# Patient Record
Sex: Female | Born: 1955 | Race: White | Hispanic: No | Marital: Married | Smoking: Current every day smoker
Health system: Southern US, Community
[De-identification: ages and names within clinical notes are randomized; demographics above are authoritative.]

## PROBLEM LIST (undated history)

## (undated) DIAGNOSIS — IMO0002 Reserved for concepts with insufficient information to code with codable children: Secondary | ICD-10-CM

## (undated) DIAGNOSIS — Z8742 Personal history of other diseases of the female genital tract: Secondary | ICD-10-CM

## (undated) DIAGNOSIS — C539 Malignant neoplasm of cervix uteri, unspecified: Secondary | ICD-10-CM

## (undated) DIAGNOSIS — IMO0001 Reserved for inherently not codable concepts without codable children: Secondary | ICD-10-CM

## (undated) DIAGNOSIS — D509 Iron deficiency anemia, unspecified: Secondary | ICD-10-CM

## (undated) DIAGNOSIS — C719 Malignant neoplasm of brain, unspecified: Secondary | ICD-10-CM

## (undated) DIAGNOSIS — R011 Cardiac murmur, unspecified: Secondary | ICD-10-CM

## (undated) HISTORY — DX: Reserved for inherently not codable concepts without codable children: IMO0001

## (undated) HISTORY — PX: TUBAL LIGATION: SHX77

## (undated) HISTORY — DX: Reserved for concepts with insufficient information to code with codable children: IMO0002

## (undated) HISTORY — PX: CARPAL TUNNEL RELEASE: SHX101

---

## 1985-01-17 HISTORY — PX: OVARIAN CYST REMOVAL: SHX89

## 2015-08-08 ENCOUNTER — Emergency Department (HOSPITAL_COMMUNITY)
Admission: EM | Admit: 2015-08-08 | Discharge: 2015-08-09 | Disposition: A | Discharge status: Home / Self Care | Payer: BLUE CROSS/BLUE SHIELD | Attending: Emergency Medicine | Admitting: Emergency Medicine

## 2015-08-08 ENCOUNTER — Encounter (HOSPITAL_COMMUNITY): Payer: Self-pay | Admitting: Emergency Medicine

## 2015-08-08 DIAGNOSIS — N939 Abnormal uterine and vaginal bleeding, unspecified: Secondary | ICD-10-CM

## 2015-08-08 DIAGNOSIS — R42 Dizziness and giddiness: Secondary | ICD-10-CM | POA: Diagnosis not present

## 2015-08-08 DIAGNOSIS — N938 Other specified abnormal uterine and vaginal bleeding: Secondary | ICD-10-CM | POA: Diagnosis present

## 2015-08-08 DIAGNOSIS — Z720 Tobacco use: Secondary | ICD-10-CM | POA: Insufficient documentation

## 2015-08-08 LAB — TYPE AND SCREEN
ABO/RH(D): O NEG
Antibody Screen: NEGATIVE

## 2015-08-08 LAB — BASIC METABOLIC PANEL
ANION GAP: 9 (ref 5–15)
BUN: 11 mg/dL (ref 6–20)
CALCIUM: 8.5 mg/dL — AB (ref 8.9–10.3)
CO2: 22 mmol/L (ref 22–32)
CREATININE: 0.73 mg/dL (ref 0.44–1.00)
Chloride: 100 mmol/L — ABNORMAL LOW (ref 101–111)
GLUCOSE: 112 mg/dL — AB (ref 65–99)
Potassium: 3.5 mmol/L (ref 3.5–5.1)
Sodium: 131 mmol/L — ABNORMAL LOW (ref 135–145)

## 2015-08-08 LAB — URINALYSIS, ROUTINE W REFLEX MICROSCOPIC
Bilirubin Urine: NEGATIVE
GLUCOSE, UA: NEGATIVE mg/dL
Ketones, ur: NEGATIVE mg/dL
NITRITE: NEGATIVE
PH: 6.5 (ref 5.0–8.0)
Protein, ur: 100 mg/dL — AB
Urobilinogen, UA: 0.2 mg/dL (ref 0.0–1.0)

## 2015-08-08 LAB — CBC WITH DIFFERENTIAL/PLATELET
BASOS ABS: 0 10*3/uL (ref 0.0–0.1)
BASOS PCT: 0 % (ref 0–1)
EOS PCT: 1 % (ref 0–5)
Eosinophils Absolute: 0.1 10*3/uL (ref 0.0–0.7)
HEMATOCRIT: 25.5 % — AB (ref 36.0–46.0)
Hemoglobin: 8 g/dL — ABNORMAL LOW (ref 12.0–15.0)
Lymphocytes Relative: 12 % (ref 12–46)
Lymphs Abs: 1.3 10*3/uL (ref 0.7–4.0)
MCH: 23.4 pg — ABNORMAL LOW (ref 26.0–34.0)
MCHC: 31.4 g/dL (ref 30.0–36.0)
MCV: 74.6 fL — AB (ref 78.0–100.0)
MONO ABS: 1 10*3/uL (ref 0.1–1.0)
MONOS PCT: 10 % (ref 3–12)
NEUTROS ABS: 8.1 10*3/uL — AB (ref 1.7–7.7)
Neutrophils Relative %: 77 % (ref 43–77)
PLATELETS: 311 10*3/uL (ref 150–400)
RBC: 3.42 MIL/uL — ABNORMAL LOW (ref 3.87–5.11)
RDW: 19.3 % — AB (ref 11.5–15.5)
WBC: 10.4 10*3/uL (ref 4.0–10.5)

## 2015-08-08 LAB — URINE MICROSCOPIC-ADD ON

## 2015-08-08 LAB — PROTIME-INR
INR: 1.03 (ref 0.00–1.49)
Prothrombin Time: 13.7 seconds (ref 11.6–15.2)

## 2015-08-08 MED ORDER — ESTROGENS CONJUGATED 25 MG IJ SOLR
25.0000 mg | Freq: Once | INTRAMUSCULAR | Status: AC
  Administered 2015-08-09: 25 mg via INTRAVENOUS
  Filled 2015-08-08: qty 25

## 2015-08-08 MED ORDER — SODIUM CHLORIDE 0.9 % IV BOLUS (SEPSIS): 500.0000 mL | Freq: Once | INTRAVENOUS | Status: DC

## 2015-08-08 MED ORDER — MEGESTROL ACETATE 40 MG PO TABS
120.0000 mg | ORAL_TABLET | Freq: Every day | ORAL | Status: DC
  Administered 2015-08-09: 120 mg via ORAL

## 2015-08-08 NOTE — ED Provider Notes (Signed)
CSN: [id number]     Arrival date & time 08/08/15  2027 History  This chart was scribed for Valerie Greek, MD by Irene Pap, ED Scribe. This patient was seen in room APA07/APA07 and patient care was started at 9:15 PM.   Chief Complaint  Patient presents with  . Vaginal Bleeding   The history is provided by the patient. No language interpreter was used.   HPI Comments: Cerissa Zeiger is a 60 y.o. female who presents to the Emergency Department complaining of intermittent vaginal bleeding onset 8 hours ago. Pt states that she had a similar episode 3 months ago that lasted 4-5 hours but never saw anyone for those symptoms. States that she went to the bathroom around lunch and had a BM when she noticed blood coming from her vagina very heavily. She states that it worsened this evening. Reports associated lightheadedness. States that her blood type is RH negative. Reports that she had abdominal surgery in the past but still has both her ovaries. Denies hematochezia, hematuria or rectal bleeding.   History reviewed. No pertinent past medical history. No past surgical history on file. No family history on file. Social History  Substance Use Topics  . Smoking status: Heavy Tobacco Smoker -- 0.50 packs/day  . Smokeless tobacco: None  . Alcohol Use: 2.4 oz/week    4 Cans of beer per week   OB History    No data available     Review of Systems  Gastrointestinal: Negative for blood in stool and anal bleeding.  Genitourinary: Positive for vaginal discharge. Negative for hematuria.  All other systems reviewed and are negative.     Allergies  Review of patient's allergies indicates no known allergies.  Home Medications   Prior to Admission medications   Medication Sig Start Date End Date Taking? Authorizing Provider  ibuprofen (ADVIL,MOTRIN) 200 MG tablet Take 400 mg by mouth every 6 (six) hours as needed for mild pain or moderate pain.   Yes Historical Provider, MD  Multiple  Vitamins-Minerals (CENTRUM ULTRA WOMENS) TABS Take 1 tablet by mouth every morning.   Yes Historical Provider, MD   BP 167/72 mmHg  Pulse 112  Temp(Src) 98 F (36.7 C) (Oral)  Resp 20  Ht '5\' 4"'$  (1.626 m)  Wt 141 lb 1.6 oz (64.003 kg)  BMI 24.21 kg/m2  SpO2 99%  LMP 08/08/2015  Physical Exam  Constitutional: She is oriented to person, place, and time. She appears well-developed and well-nourished. No distress.  HENT:  Head: Normocephalic and atraumatic.  Right Ear: Hearing normal.  Left Ear: Hearing normal.  Nose: Nose normal.  Mouth/Throat: Oropharynx is clear and moist and mucous membranes are normal.  Eyes: Conjunctivae and EOM are normal. Pupils are equal, round, and reactive to light.  Neck: Normal range of motion. Neck supple.  Cardiovascular: Regular rhythm, S1 normal and S2 normal.  Exam reveals no gallop and no friction rub.   No murmur heard. Pulmonary/Chest: Effort normal and breath sounds normal. No respiratory distress. She exhibits no tenderness.  Abdominal: Soft. Normal appearance and bowel sounds are normal. There is no hepatosplenomegaly. There is no tenderness. There is no rebound, no guarding, no tenderness at McBurney's point and negative Murphy's sign. No hernia.  Genitourinary:  Blood and clots in vaginal vault. Some ongoing bleeding, cervix partially obscured.  Musculoskeletal: Normal range of motion.  Neurological: She is alert and oriented to person, place, and time. She has normal strength. No cranial nerve deficit or sensory deficit. Coordination normal.  GCS eye subscore is 4. GCS verbal subscore is 5. GCS motor subscore is 6.  Skin: Skin is warm, dry and intact. No rash noted. No cyanosis.  Psychiatric: She has a normal mood and affect. Her speech is normal and behavior is normal. Thought content normal.  Nursing note and vitals reviewed.   ED Course  Procedures (including critical care time) DIAGNOSTIC STUDIES: Oxygen Saturation is 99% on RA, normal  by my interpretation.    COORDINATION OF CARE: 9:18 PM-Discussed treatment plan which includes labs, exam, and endometrial biopsy follow up with pt at bedside and pt agreed to plan.   Labs Review Labs Reviewed  CBC WITH DIFFERENTIAL/PLATELET - Abnormal; Notable for the following:    RBC 3.42 (*)    Hemoglobin 8.0 (*)    HCT 25.5 (*)    MCV 74.6 (*)    MCH 23.4 (*)    RDW 19.3 (*)    Neutro Abs 8.1 (*)    All other components within normal limits  BASIC METABOLIC PANEL - Abnormal; Notable for the following:    Sodium 131 (*)    Chloride 100 (*)    Glucose, Bld 112 (*)    Calcium 8.5 (*)    All other components within normal limits  PROTIME-INR  URINALYSIS, ROUTINE W REFLEX MICROSCOPIC (NOT AT [facility])  TYPE AND SCREEN    Imaging Review No results found.    EKG Interpretation None      MDM   Final diagnoses:  None   abnormal uterine bleeding  Patient presents to the ER for evaluation of vaginal bleeding. Symptoms began earlier this afternoon. She reports that she did have bleeding for several hours a couple of months ago, but none since. Patient is not expressing any symptoms other than mild lightheadedness. Vital signs are stable, no hypotension.   Bloodwork reveals a hemoglobin of 8. I discussed the patient with Dr. Elonda Husky. He felt that the patient could be managed as an outpatient. He recommended Premarin 25 mg IV, Megace 120 mg by mouth here and will continue Megace for 2 weeks. He will see the patient in the office. Patient given instructions to return to the ER for weakness, dizziness, chest pain, palpitations, shortness of breath, passing out.  I personally performed the services described in this documentation, which was scribed in my presence. The recorded information has been reviewed and is accurate.      Valerie Greek, MD 08/09/15 [phone]

## 2015-08-08 NOTE — ED Notes (Signed)
Pelvic cart set up

## 2015-08-08 NOTE — ED Notes (Signed)
Pt has had intermittent vaginal bleeding, worse tonight, feels some light headed.

## 2015-08-09 MED ORDER — ESTROGENS CONJUGATED 25 MG IJ SOLR
INTRAMUSCULAR | Status: AC
  Filled 2015-08-09: qty 25

## 2015-08-09 MED ORDER — FERROUS SULFATE 325 (65 FE) MG PO TABS: 325.0000 mg | ORAL_TABLET | Freq: Three times a day (TID) | ORAL | Status: DC

## 2015-08-09 MED ORDER — MEGESTROL ACETATE 40 MG PO TABS
ORAL_TABLET | ORAL | Status: AC
  Filled 2015-08-09: qty 3

## 2015-08-09 MED ORDER — MEGESTROL ACETATE 40 MG PO TABS: 120.0000 mg | ORAL_TABLET | Freq: Every day | ORAL | Status: DC

## 2015-08-09 NOTE — Discharge Instructions (Signed)
Abnormal Uterine Bleeding Abnormal uterine bleeding can affect women at various stages in life, including teenagers, women in their reproductive years, pregnant women, and women who have reached menopause. Several kinds of uterine bleeding are considered abnormal, including:  Bleeding or spotting between periods.   Bleeding after sexual intercourse.   Bleeding that is heavier or more than normal.   Periods that last longer than usual.  Bleeding after menopause.  Many cases of abnormal uterine bleeding are minor and simple to treat, while others are more serious. Any type of abnormal bleeding should be evaluated by your health care provider. Treatment will depend on the cause of the bleeding. HOME CARE INSTRUCTIONS Monitor your condition for any changes. The following actions may help to alleviate any discomfort you are experiencing:  Avoid the use of tampons and douches as directed by your health care provider.  Change your pads frequently. You should get regular pelvic exams and Pap tests. Keep all follow-up appointments for diagnostic tests as directed by your health care provider.  SEEK MEDICAL CARE IF:   Your bleeding lasts more than 1 week.   You feel dizzy at times.  SEEK IMMEDIATE MEDICAL CARE IF:   You pass out.   You are changing pads every 15 to 30 minutes.   You have abdominal pain.  You have a fever.   You become sweaty or weak.   You are passing large blood clots from the vagina.   You start to feel nauseous and vomit. MAKE SURE YOU:   Understand these instructions.  Will watch your condition.  Will get help right away if you are not doing well or get worse. Document Released: 12/09/2005 Document Revised: 12/14/2013 Document Reviewed: 07/08/2013 Exit[employer] Patient Information 2015 [employer]. This information is not intended to replace advice given to you by your health care provider. Make sure you discuss any questions you have with your  health care provider.  

## 2015-08-09 NOTE — ED Notes (Signed)
Pt states understanding of care given and discharge instructions.  To follow up with OBGYN first thing in the morning

## 2015-08-16 ENCOUNTER — Encounter: Payer: Self-pay | Admitting: Obstetrics and Gynecology

## 2015-08-16 ENCOUNTER — Other Ambulatory Visit: Payer: Self-pay | Admitting: Obstetrics and Gynecology

## 2015-08-16 ENCOUNTER — Ambulatory Visit (INDEPENDENT_AMBULATORY_CARE_PROVIDER_SITE_OTHER): Payer: BLUE CROSS/BLUE SHIELD | Admitting: Obstetrics and Gynecology

## 2015-08-16 VITALS — BP 142/76 | Ht 64.0 in | Wt 134.0 lb

## 2015-08-16 DIAGNOSIS — N888 Other specified noninflammatory disorders of cervix uteri: Secondary | ICD-10-CM

## 2015-08-16 DIAGNOSIS — C539 Malignant neoplasm of cervix uteri, unspecified: Secondary | ICD-10-CM

## 2015-08-16 DIAGNOSIS — R1032 Left lower quadrant pain: Secondary | ICD-10-CM | POA: Diagnosis not present

## 2015-08-16 DIAGNOSIS — N950 Postmenopausal bleeding: Secondary | ICD-10-CM

## 2015-08-16 DIAGNOSIS — D500 Iron deficiency anemia secondary to blood loss (chronic): Secondary | ICD-10-CM | POA: Diagnosis not present

## 2015-08-16 NOTE — Progress Notes (Signed)
Patient ID: Valerie Figueroa, female   DOB: [birth year], 59 y.o.   MRN: [id number] Note: computers down for much of this visit, notes completed after hours This chart was scribed for Valerie Kind, MD by Erling Conte, ED Scribe. The patient's care was started at [facility] Visit  Patient name: Valerie Figueroa MRN [id number]  Date of birth: Jan 20, 1956  CC & HPI:  Valerie Figueroa is a 59 y.o. female presenting today for intermittent, post menopausal vaginal bleeding, onset 3 months. She states she has not had a period for 10-15 years. She is not on any anticoagulants. She reports her first episode of bleeding was in May of this year and she had another episode last Tuesday for which she went to the ER.   Pt here today for post menopausal bleeding. Pt states that she has not had a period for 10-15 years. Pt states that she had the first episode of bleeding in May and had another episode last Tuesday and has been spotting since. Pt states that her left side has been bothering for a little while. Pt states that the pain goes away when she has heavy bleeding.   Ed physician saw pt,consulted Gyn for f/u, as outpt, and gave  Premarin 25 mg IV, Megace 120 mg by mouth here and will continue Megace for 2 weeks  On Megace since ED visit. Bleeding again today  ROS:  Denies wt loss, or bowel changes  Pertinent History Reviewed:   Reviewed: Significant for no recent medical care Medical         Past Medical History  Diagnosis Date  . Anemia                               Surgical Hx:    Past Surgical History  Procedure Laterality Date  . Ovarian cyst removal Left 01-17-1985  . Carpal tunnel release Bilateral    Medications: Reviewed & Updated - see associated section                       Current outpatient prescriptions:  .  ferrous sulfate 325 (65 FE) MG tablet, Take 1 tablet (325 mg total) by mouth 3 (three) times daily with meals., Disp: 90  tablet, Rfl: 0 .  ibuprofen (ADVIL,MOTRIN) 200 MG tablet, Take 400 mg by mouth every 6 (six) hours as needed for mild pain or moderate pain., Disp: , Rfl:  .  megestrol (MEGACE) 40 MG tablet, Take 3 tablets (120 mg total) by mouth daily., Disp: 42 tablet, Rfl: 0 .  Multiple Vitamins-Minerals (CENTRUM ULTRA WOMENS) TABS, Take 1 tablet by mouth every morning., Disp: , Rfl:    Social History: Reviewed -  reports that she has been smoking.  She has never used smokeless tobacco.  Objective Findings:  Vitals: Blood pressure 142/76, height '5\' 4"'$  (1.626 m), weight 134 lb (60.782 kg), last menstrual period 08/08/2015.  Physical Examination: General appearance - alert, well appearing, and in no distress, oriented to person, place, and time, normal appearing weight and chronically ill appearing Mental status - alert, oriented to person, place, and time, normal mood, behavior, speech, dress, motor activity, and thought processes Eyes - pupils equal and reactive, extraocular eye movements intact, sclera anicteric Ears -  Neck - supple, no significant adenopathy,  Lymphatics - no palpable lymphadenopathy, no hepatosplenomegaly, inguinal nodes not palpable Abdomen - soft,  nontender, nondistended, no masses or organomegaly Pelvic - VULVA: normal appearing vulva with no masses, tenderness or lesions, VAGINA: atrophic, PELVIC FLOOR EXAM: no cystocele, rectocele or prolapse noted,   CERVIX: friable tissue at the posterior portion of a firm fibrous cervix with parametrial firmness bilateral L>R, not extending to sidewall on either side. Biopsies taken after consent obtained, and sent for histology. Monsels required due to signifiant bleeding, with satisfactory control , lesion present and adjacent tissues fibrotic  , UTERUS: uterus is normal size, shape, consistency and nontender, ADNEXA: parametrial thickening, exam limited by this, but no pelvic masses above the suspected cervical disease  Extremities -  peripheral pulses normal, no pedal edema, no clubbing or cyanosis Skin - normal coloration and turgor, no rashes, no suspicious skin lesions noted Less subcutaneous fat than expected.    Assessment & Plan:   A:  1. Postmenopausal bleeding due to suspected cervical cancer. 2 anemia   P:  1. Biopsies done and in process 2. CT abdomen and pelvis to be ordered, also CXR 3. Repeat labs 4. Return 5 days for results, will likely refer promptly to [place].

## 2015-08-16 NOTE — Progress Notes (Signed)
Patient ID: Valerie Figueroa, female   DOB: [birth year], 59 y.o.   MRN: [id number] Pt here today for post menopausal bleeding. Pt states that she has not had a period for 10-15 years. Pt states that she had the first episode of bleeding in May and had another episode last Tuesday and has been spotting since. Pt states that her left side has been bothering for a little while. Pt states that the pain goes away when she has heavy bleeding.

## 2015-08-17 ENCOUNTER — Other Ambulatory Visit: Payer: Self-pay | Admitting: Obstetrics and Gynecology

## 2015-08-17 DIAGNOSIS — N888 Other specified noninflammatory disorders of cervix uteri: Secondary | ICD-10-CM

## 2015-08-17 DIAGNOSIS — IMO0002 Reserved for concepts with insufficient information to code with codable children: Secondary | ICD-10-CM

## 2015-08-17 DIAGNOSIS — R229 Localized swelling, mass and lump, unspecified: Secondary | ICD-10-CM

## 2015-08-17 LAB — COMPREHENSIVE METABOLIC PANEL
A/G RATIO: 1.3 (ref 1.1–2.5)
ALT: 20 IU/L (ref 0–32)
AST: 13 IU/L (ref 0–40)
Albumin: 4.4 g/dL (ref 3.6–4.8)
Alkaline Phosphatase: 94 IU/L (ref 39–117)
BUN/Creatinine Ratio: 12 (ref 11–26)
BUN: 8 mg/dL (ref 8–27)
Bilirubin Total: <0.2 mg/dL (ref 0.0–1.2)
CHLORIDE: 105 mmol/L (ref 97–108)
CO2: 19 mmol/L (ref 18–29)
Calcium: 10.1 mg/dL (ref 8.7–10.3)
Creatinine, Ser: 0.69 mg/dL (ref 0.57–1.00)
GFR calc Af Amer: 109 mL/min/{1.73_m2} (ref >59)
GFR, EST NON AFRICAN AMERICAN: 95 mL/min/{1.73_m2} (ref >59)
Globulin, Total: 3.3 g/dL (ref 1.5–4.5)
Glucose: 105 mg/dL — ABNORMAL HIGH (ref 65–99)
POTASSIUM: 4.3 mmol/L (ref 3.5–5.2)
Sodium: 140 mmol/L (ref 134–144)
Total Protein: 7.7 g/dL (ref 6.0–8.5)

## 2015-08-17 LAB — CBC
HEMOGLOBIN: 8.2 g/dL — AB (ref 11.1–15.9)
Hematocrit: 26.8 % — ABNORMAL LOW (ref 34.0–46.6)
MCH: 23.8 pg — AB (ref 26.6–33.0)
MCHC: 30.6 g/dL — ABNORMAL LOW (ref 31.5–35.7)
MCV: 78 fL — ABNORMAL LOW (ref 79–97)
Platelets: 449 10*3/uL — ABNORMAL HIGH (ref 150–379)
RBC: 3.45 x10E6/uL — AB (ref 3.77–5.28)
RDW: 23.1 % — ABNORMAL HIGH (ref 12.3–15.4)
WBC: 9.2 10*3/uL (ref 3.4–10.8)

## 2015-08-17 LAB — TSH: TSH: 2.39 u[IU]/mL (ref 0.450–4.500)

## 2015-08-18 ENCOUNTER — Telehealth: Payer: Self-pay | Admitting: *Deleted

## 2015-08-18 DIAGNOSIS — IMO0002 Reserved for concepts with insufficient information to code with codable children: Secondary | ICD-10-CM

## 2015-08-18 DIAGNOSIS — N888 Other specified noninflammatory disorders of cervix uteri: Secondary | ICD-10-CM

## 2015-08-18 DIAGNOSIS — R229 Localized swelling, mass and lump, unspecified: Secondary | ICD-10-CM

## 2015-08-18 DIAGNOSIS — N950 Postmenopausal bleeding: Secondary | ICD-10-CM

## 2015-08-21 ENCOUNTER — Ambulatory Visit: Payer: BLUE CROSS/BLUE SHIELD | Admitting: Obstetrics and Gynecology

## 2015-08-21 ENCOUNTER — Ambulatory Visit (HOSPITAL_COMMUNITY)
Admission: RE | Admit: 2015-08-21 | Discharge: 2015-08-21 | Disposition: A | Discharge status: Home / Self Care | Payer: BLUE CROSS/BLUE SHIELD | Source: Ambulatory Visit | Attending: Obstetrics and Gynecology | Admitting: Obstetrics and Gynecology

## 2015-08-21 ENCOUNTER — Telehealth: Payer: Self-pay | Admitting: *Deleted

## 2015-08-21 ENCOUNTER — Other Ambulatory Visit: Payer: Self-pay | Admitting: Obstetrics and Gynecology

## 2015-08-21 DIAGNOSIS — R590 Localized enlarged lymph nodes: Secondary | ICD-10-CM | POA: Diagnosis not present

## 2015-08-21 DIAGNOSIS — R1909 Other intra-abdominal and pelvic swelling, mass and lump: Secondary | ICD-10-CM | POA: Insufficient documentation

## 2015-08-21 DIAGNOSIS — N888 Other specified noninflammatory disorders of cervix uteri: Secondary | ICD-10-CM

## 2015-08-21 DIAGNOSIS — N63 Unspecified lump in breast: Secondary | ICD-10-CM | POA: Insufficient documentation

## 2015-08-21 DIAGNOSIS — C539 Malignant neoplasm of cervix uteri, unspecified: Secondary | ICD-10-CM | POA: Diagnosis not present

## 2015-08-21 DIAGNOSIS — R918 Other nonspecific abnormal finding of lung field: Secondary | ICD-10-CM | POA: Diagnosis not present

## 2015-08-21 MED ORDER — IOHEXOL 300 MG/ML  SOLN
100.0000 mL | Freq: Once | INTRAMUSCULAR | Status: AC | PRN
  Administered 2015-08-21: 100 mL via INTRAVENOUS

## 2015-08-21 NOTE — Telephone Encounter (Signed)
Pt aware of appointment times

## 2015-08-22 ENCOUNTER — Telehealth: Payer: Self-pay | Admitting: Obstetrics and Gynecology

## 2015-08-22 ENCOUNTER — Encounter: Payer: Self-pay | Admitting: Obstetrics and Gynecology

## 2015-08-22 DIAGNOSIS — C539 Malignant neoplasm of cervix uteri, unspecified: Secondary | ICD-10-CM | POA: Insufficient documentation

## 2015-08-22 NOTE — Telephone Encounter (Signed)
Pt already has address and phone number and has directions to get there.

## 2015-08-23 ENCOUNTER — Encounter: Payer: Self-pay | Admitting: Gynecologic Oncology

## 2015-08-23 ENCOUNTER — Ambulatory Visit: Payer: BLUE CROSS/BLUE SHIELD | Attending: Gynecologic Oncology | Admitting: Gynecologic Oncology

## 2015-08-23 VITALS — BP 162/64 | HR 98 | Temp 98.3°F | Resp 18 | Ht 64.0 in | Wt 136.4 lb

## 2015-08-23 DIAGNOSIS — N63 Unspecified lump in breast: Secondary | ICD-10-CM | POA: Insufficient documentation

## 2015-08-23 DIAGNOSIS — C539 Malignant neoplasm of cervix uteri, unspecified: Secondary | ICD-10-CM | POA: Insufficient documentation

## 2015-08-23 DIAGNOSIS — N6310 Unspecified lump in the right breast, unspecified quadrant: Secondary | ICD-10-CM

## 2015-08-23 NOTE — Progress Notes (Signed)
Consult Note: Gyn-Onc  Valerie Figueroa 59 y.o. female  CC:  Chief Complaint  Patient presents with  . Cervical Cancer    New consult    HPI: Patient is seen today in consultation at the request of Dr. Mallory Shirk for newly diagnosed cervical cancer.  Patient is a 33 gravida 3 para 3 who was in her usual state of health until May of this year. At that time she began having some vaginal bleeding which stopped spontaneously. She then presented to her local emergency room in August with heavy vaginal bleeding. She was given IV Premarin as well as discharged on oral Premarin. That did help decrease her bleeding. She had a cervical biopsy performed on August 24 that revealed squamous cell carcinoma. She was seen by Dr. Glo Herring in the office and had a large fungating cervical mass with at least parametrial involvement appreciated.  A CT scan of the chest, abdomen and pelvis was performed on August 29. This revealed: CT CHEST FINDINGS  Mediastinum/Nodes: There is a 1.7 cm oval mass within the upper-outer quadrant of the right breast. Visualized thyroid is unremarkable. No enlarged axillary mediastinal or hilar lymphadenopathy. Multiple prominent mediastinal lymph nodes. Normal heart size. Coronary arterial vascular calcifications. No pericardial effusion.  Lungs/Pleura: Central airways are patent. Multiple bilateral pulmonary nodules are demonstrated including a 3 mm right lower lobe pulmonary nodule (image 40; series 6) ; a 2 mm right lower lobe nodule (image 40; series 6) a 2 mm left upper lobe pulmonary nodule (image 27; series 6) and a 1.3 cm nodular ground-glass area within the superior left lower lobe (image 29; series 6). Additional small scattered pulmonary nodules are demonstrated. Additionally within the right lower lobe there is a 2.7 x 1.8 cm irregular nodular opacity (image 47; series 6). No pleural effusion or pneumothorax.  CT ABDOMEN AND PELVIS FINDINGS  Hepatobiliary:  Liver is normal in size and contour. No focal lesion identified. Multiple stones are demonstrated in the gallbladder lumen. No gallbladder wall thickening or pericholecystic fluid.  Pancreas: Unremarkable  Spleen: Unremarkable  Adrenals/Urinary Tract: Adrenal glands are normal. The right kidneyis atrophic when compared to the left kidney. Kidneys enhance symmetrically with contrast. There is enhancement of the urothelium involving the right renal collecting system and proximal right ureter. Urinary bladder is decompressed. There is a small amount stranding about the right kidney. Posterior to the right kidney there is an irregular opacity measuring up to 7 mm (image 70; series 2). Delayed images demonstrate asymmetric nephrogram.  Stomach/Bowel: Oral contrast material is demonstrated to the level of the rectum. No abnormal bowel wall thickening or evidence for bowel obstruction. The appendix is normal. No free fluid or free intraperitoneal air.  Vascular/Lymphatic: Normal caliber abdominal aorta. Multiple prominent and enlarged retroperitoneal lymph nodes are demonstrated including a 13 mm left periaortic lymph node (image 71; series 2) and a 0.9 cm right common iliac lymph node (image 89; series 2). There is a 13 mm right external iliac lymph node (image 102; series 2) and a 0.8 cm left external iliac lymph node (image 109; series 2).  Other: There is a 4.2 x 5.2 x 6.1 cm irregular thick-walled mass involving the cervix and lower uterine segment (image 111; series 2). There is surrounding fat stranding. There is dilatation of the  endometrium.  Musculoskeletal: 10 mm sclerotic focus within the lateral left fifth rib (image 25; series 2).  IMPRESSION: Large irregular mass involving the cervix and lower uterine segment compatible with recently biopsied  cervical malignancy. There is associated dilatation of the endometrial canal. Multiple enlarged retroperitoneal and pelvic lymph nodes  concerning for metastatic adenopathy.  Abnormal enhancement of the urothelium involving the right renal collecting system and proximal right ureter raising the possibility of an infectious process. Recommend correlation with urinalysis. Posterior to the right kidney there is irregular soft tissue density material which may represent a small amount of infectious/inflammatory stranding of the perinephric fat.  Irregular nodular opacity within the right lower lobe measuring up to 2.7 cm may be secondary to an infectious or inflammatory process however is concerning for metastatic disease in the setting of known cervical malignancy or possible primary pulmonary malignancy.  Multiple additional 2-3 mm nodules within the lungs bilaterally may be infectious or inflammatory in etiology or potentially secondary to metastatic disease. 10 mm sclerotic focus within the lateral left fifth rib may represent a bone island or osseous metastasis.  Oval mass within the upper-outer quadrant of the right breast. Recommend dedicated evaluation with mammography if not previously evaluated.  She comes in today for evaluation of this. She stay she's not been sexually active for more than 3 years but has never expressed any postcoital bleeding. She denies any change in her bowel or bladder habits but has taken some Maalox and she was taking some iron pills and did develop some constipation. She does occasionally have some lower pelvic pain for which she takes Advil which relieves her pain significantly. She has any chest pain or shortness of breath. She does complain of her legs feeling somewhat weak. Her weight is been stable. She stay she's not had a Pap smear in more than 15 years. She states she's never had a mammogram and never had a colonoscopy.  Review of Systems  Constitutional: Denies fever, no weight loss, legs feel week. Skin: No rash Cardiovascular: No chest pain, shortness of breath, or edema  Pulmonary:  No cough  Gastro Intestinal: Reporting intermittent lower abdominal soreness.  No nausea, vomiting, occ constipation, or diarrhea reported. No bright red blood per rectum or change in bowel movement.  Genitourinary: + vaginal bleeding  Musculoskeletal: No myalgia, arthralgia, joint swelling or pain.  Neurologic: +weakness  Current Meds:  Outpatient Encounter Prescriptions as of 08/23/2015  Medication Sig  . ferrous sulfate 325 (65 FE) MG tablet Take 1 tablet (325 mg total) by mouth 3 (three) times daily with meals.  Marland Kitchen ibuprofen (ADVIL,MOTRIN) 200 MG tablet Take 400 mg by mouth every 6 (six) hours as needed for mild pain or moderate pain.  . megestrol (MEGACE) 40 MG tablet Take 3 tablets (120 mg total) by mouth daily.  . Multiple Vitamins-Minerals (CENTRUM ULTRA WOMENS) TABS Take 1 tablet by mouth every morning.   No facility-administered encounter medications on file as of 08/23/2015.    Allergy: No Known Allergies  Social Hx:   Social History   Social History  . Marital Status: Married    Spouse Name: N/A  . Number of Children: N/A  . Years of Education: N/A   Occupational History  . Not on file.   Social History Main Topics  . Smoking status: Heavy Tobacco Smoker -- 0.50 packs/day for 30 years  . Smokeless tobacco: Never Used  . Alcohol Use: Yes     Comment: 4 cans beer daily  . Drug Use: No  . Sexual Activity: Not Currently    Birth Control/ Protection: Surgical   Other Topics Concern  . Not on file   Social History Narrative  Past Surgical Hx:  Past Surgical History  Procedure Laterality Date  . Ovarian cyst removal Left 01-17-1985  . Carpal tunnel release Bilateral     Past Medical Hx:  Past Medical History  Diagnosis Date  . Anemia     Oncology Hx:    Cervix cancer   08/16/2015 Initial Diagnosis Cervix cancer    Family Hx:  Family History  Problem Relation Age of Onset  . Cancer Father     lung    Vitals:  Blood pressure 162/64, pulse 98,  temperature 98.3 F (36.8 C), temperature source Oral, resp. rate 18, height '5\' 4"'$  (1.626 m), weight 136 lb 6.4 oz (61.871 kg), last menstrual period 08/08/2015, SpO2 100 %.  Physical Exam:  Well-nourished, well-developed female in no acute distress.  Neck: Supple, no lymphadenopathy no thyromegaly.  Lungs: Inspiratory wheezes. Otherwise clear to auscultation.  Cardiac: Regular rate and rhythm.  Abdomen: Well-healed transverse incision. Abdomen is soft, nontender, nondistended. There are no palpable masses or hepatosplenomegaly.  Groins: No lymphadenopathy.  Extremities: No edema.  Pelvic: External genitalia within normal limits. Speculum examination reveals a large necrotic mass at the top of the vagina with blood clots. There is no active bleeding. Bimanual examination reveals an 8 cm mass replacing the cervix. On rectovaginal examination there is bilateral sidewall involvement. The induration to the side wall is smooth. There is no nodularity.  Assessment/Plan: 59 year old with clinical IIIB squamous cell carcinoma cervix. She may have stage IV disease based on CT scan that shows questionable pulmonary nodules. We will need to obtain a PET scan to better evaluate the pulmonary disease. I reviewed the following scenarios with her.  1) If on PET scan it appears that this is consistent with stage IV cervical cancer with pulmonary disease we would start with paclitaxel carboplatin with or without bevacizumab as primary treatment. I do believe she would benefit from a short course of radiation therapy for local control as well as to control the bleeding. With this in mind we will schedule her to see Dr. Sondra Come and Dr. Marko Plume.  2) If on PET scan it appears that her disease is limited to the pelvis that she will undergo definitive chemoradiation with weekly cisplatin.  3) We will schedule her for a mammogram to evaluate the abnormality noted on CT scan.  The patient was given information  regarding the chemotherapy agents as well as cervical cancer in general. Her questions were elicited in answer to her satisfaction. She has my card and knows all be happy to speak with her should any questions arise prior to her starting therapy. She knows that we'll contact her with the results of her PET scan and mammogram.  Valerie Figueroa A., MD 08/23/2015, 11:20 AM

## 2015-08-23 NOTE — Patient Instructions (Addendum)
We will call you with an appointment to see Dr. Evlyn Clines (medical oncology) who will prescribed chemotherapy.  You are scheduled to see Dr. Teryl Lucy (radiation oncology) on 08/30/15 at [place].  Please call our office with any questions or concerns you have.     Cisplatin injection What is this medicine? CISPLATIN (SIS pla tin) is a chemotherapy drug. It targets fast dividing cells, like cancer cells, and causes these cells to die. This medicine is used to treat many types of cancer like bladder, ovarian, and testicular cancers. This medicine may be used for other purposes; ask your health care provider or pharmacist if you have questions. COMMON BRAND NAME(S): Platinol, Platinol -AQ What should I tell my health care provider before I take this medicine? They need to know if you have any of these conditions: -blood disorders -hearing problems -kidney disease -recent or ongoing radiation therapy -an unusual or allergic reaction to cisplatin, carboplatin, other chemotherapy, other medicines, foods, dyes, or preservatives -pregnant or trying to get pregnant -breast-feeding How should I use this medicine? This drug is given as an infusion into a vein. It is administered in a hospital or clinic by a specially trained health care professional. Talk to your pediatrician regarding the use of this medicine in children. Special care may be needed. Overdosage: If you think you have taken too much of this medicine contact a poison control center or emergency room at once. NOTE: This medicine is only for you. Do not share this medicine with others. What if I miss a dose? It is important not to miss a dose. Call your doctor or health care professional if you are unable to keep an appointment. What may interact with this medicine? -dofetilide -foscarnet -medicines for seizures -medicines to increase blood counts like filgrastim, pegfilgrastim, sargramostim -probenecid -pyridoxine used with  altretamine -rituximab -some antibiotics like amikacin, gentamicin, neomycin, polymyxin B, streptomycin, tobramycin -sulfinpyrazone -vaccines -zalcitabine Talk to your doctor or health care professional before taking any of these medicines: -acetaminophen -aspirin -ibuprofen -ketoprofen -naproxen This list may not describe all possible interactions. Give your health care provider a list of all the medicines, herbs, non-prescription drugs, or dietary supplements you use. Also tell them if you smoke, drink alcohol, or use illegal drugs. Some items may interact with your medicine. What should I watch for while using this medicine? Your condition will be monitored carefully while you are receiving this medicine. You will need important blood work done while you are taking this medicine. This drug may make you feel generally unwell. This is not uncommon, as chemotherapy can affect healthy cells as well as cancer cells. Report any side effects. Continue your course of treatment even though you feel ill unless your doctor tells you to stop. In some cases, you may be given additional medicines to help with side effects. Follow all directions for their use. Call your doctor or health care professional for advice if you get a fever, chills or sore throat, or other symptoms of a cold or flu. Do not treat yourself. This drug decreases your body's ability to fight infections. Try to avoid being around people who are sick. This medicine may increase your risk to bruise or bleed. Call your doctor or health care professional if you notice any unusual bleeding. Be careful brushing and flossing your teeth or using a toothpick because you may get an infection or bleed more easily. If you have any dental work done, tell your dentist you are receiving this medicine. Avoid  taking products that contain aspirin, acetaminophen, ibuprofen, naproxen, or ketoprofen unless instructed by your doctor. These medicines may hide  a fever. Do not become pregnant while taking this medicine. Women should inform their doctor if they wish to become pregnant or think they might be pregnant. There is a potential for serious side effects to an unborn child. Talk to your health care professional or pharmacist for more information. Do not breast-feed an infant while taking this medicine. Drink fluids as directed while you are taking this medicine. This will help protect your kidneys. Call your doctor or health care professional if you get diarrhea. Do not treat yourself. What side effects may I notice from receiving this medicine? Side effects that you should report to your doctor or health care professional as soon as possible: -allergic reactions like skin rash, itching or hives, swelling of the face, lips, or tongue -signs of infection - fever or chills, cough, sore throat, pain or difficulty passing urine -signs of decreased platelets or bleeding - bruising, pinpoint red spots on the skin, black, tarry stools, nosebleeds -signs of decreased red blood cells - unusually weak or tired, fainting spells, lightheadedness -breathing problems -changes in hearing -gout pain -low blood counts - This drug may decrease the number of white blood cells, red blood cells and platelets. You may be at increased risk for infections and bleeding. -nausea and vomiting -pain, swelling, redness or irritation at the injection site -pain, tingling, numbness in the hands or feet -problems with balance, movement -trouble passing urine or change in the amount of urine Side effects that usually do not require medical attention (report to your doctor or health care professional if they continue or are bothersome): -changes in vision -loss of appetite -metallic taste in the mouth or changes in taste This list may not describe all possible side effects. Call your doctor for medical advice about side effects. You may report side effects to FDA at  1-800-FDA-1088. Where should I keep my medicine? This drug is given in a hospital or clinic and will not be stored at home. NOTE: This sheet is a summary. It may not cover all possible information. If you have questions about this medicine, talk to your doctor, pharmacist, or health care provider.  2015, Elsevier/Gold Standard. (2008-03-15 14:40:54)   External Beam Radiation Therapy External beam radiation therapy is a radiation treatment. It may be done to:  Treat cancer. The radiation may be used to:  Destroy cancer cells. Radiation delivered during the treatment damages cancer cells. It also damages normal cells, but normal cells have the DNA to repair themselves while cancer cells do not.  Help with symptoms of your cancer.  Stop the growth of any remaining cancer cells after surgery.  Prevent cancer cells from growing in areas that do not have evidence of cancer (prophylactic radiation therapy).  Treat or shrink a tumor.  Reduce pain (palliative therapy). The therapy delivers higher doses of radiation than X-rays, CT scans, and most other imaging tests. Compared with internal radiation therapy, external beam radiation therapy can deliver radiation to a fairly large area.  The amount of radiation you will receive and the length of therapy depends on your medical condition. You should not feel the radiation being delivered or any pain during your therapy. RISKS AND COMPLICATIONS Most people experience side effects from the therapy. Side effects depend on the amount of radiation and the part of your body exposed to radiation. For example:  Hair loss may occur if the  radiation therapy is directed to your head.  Coughing or difficulty swallowing may occur if the radiation therapy is directed to your head, neck, or chest.  Nausea, vomiting, or diarrhea may occur if the radiation therapy is directed to your abdomen or pelvis.  Bladder problems, frequent urination, or sexual  dysfunction may occur if the radiation therapy is directed to your bladder, kidney, or prostate. Regardless of the amount or location of the radiation, you will probably have fatigue. Other side effects may include:  Red, flaking skin in the affected area.  Hair loss in the affected area.  Itching in the affected area. Side effects may take 2-3 weeks to develop. Most side effects are temporary and can be controlled. Once the therapy is complete, side effects will not stop right away. It can take up to 3-4 weeks for you to regain your energy or for side effects to lessen. Your body does heal from the radiation. BEFORE THE PROCEDURE There will be a planning session (simulation). During the session:  Your health care provider will plan exactly where the radiation will be delivered (treatment field).  You will be positioned for your therapy. The goal is to have a position that can be reproduced for each therapy session.  Temporary marks may be drawn on your body. Permanent marks may also be drawn on your body in order for you to be positioned the same way for each therapy session. PROCEDURE  You will either lie on a table or sit in a chair in the position determined for your therapy.  The radiation machine (linear accelerator) will move around you to deliver the radiation in exact doses from many angles. AFTER THE PROCEDURE You may return to your normal schedule including diet, activities, and medicines, unless your health care provider tells you otherwise. Document Released: 04/27/2009 Document Revised: 04/25/2014 Document Reviewed: 11/17/2013 [facility] Patient Information 2015 [place], [place]. This information is not intended to replace advice given to you by your health care provider. Make sure you discuss any questions you have with your health care provider.   Cervical Cancer The cervix is the opening and bottom part of the uterus between the vagina and the uterus. Cervical cancer is a  fairly common cancer. It occurs most often in women between the ages of 43 years and 32 years. Cells of the cervix act very much like skin cells. These cells are exposed to toxins, viruses, and bacteria that may cause abnormal changes.  There are two kinds of cancers of the cervix:   Squamous cell carcinoma. This type of cancer starts in the flat or scale-like cells that line the cervix. Squamous cell carcinoma can develop from a sexually transmitted infection caused by the human papillomavirus (HPV).  Adenocarcinoma. This type of cervical cancer starts in glandular cells that line the cervix. RISK FACTORS The risk of getting cancer of the cervix is related to your lifestyle, sexual history, health, and immune system. Risks for cervical cancer include:   Having a sexually transmitted viral infection. These include:  Chlamydia.   Herpes.   HPV.  Becoming sexually active before age 28 years.  Having more than one sexual partner or having sex with someone who has more than one sexual partner.  Not using condoms with sexual partners.  Having had cancer of the vagina or vulva.  Having a sexual partner who has or had cancer of the penis or who has had a sexual partner with abnormal cervical cells (dysplasia) or cervical cancer.  Using  oral contraceptives (also called birth control pills).  Smoking.   Having a weakened immune system. For example, human immunodeficiency virus (HIV) or other immune deficiency disorders.  Being the daughter of a woman who took diethylstilbestrol (DES) during pregnancy.  Having a sister or mother who has had cancer of the cervix.  Being [place] American, Hispanic, Asian, or a woman from the [place].  A history of dysplasia of the cervix. SIGNS AND SYMPTOMS  Symptoms are usually not present in the early stages of cervical cancer. Once the cancer invades the cervix and surrounding tissues, the woman may have:   Abnormal vaginal bleeding or  menstrual bleeding that is longer or heavier than usual.  Bleeding after intercourse, douching, or a Pap test.  Vaginal bleeding following menopause.  Abnormal vaginal discharge.  Pelvic discomfort or pain.  An abnormal Pap test.  Pain during sexual intercourse. Symptoms of more advanced cervical cancer may include:   Loss of appetite or weight loss.  Tiredness (fatigue).  Back and leg pain.  Inability to control urination or bowel movements. DIAGNOSIS  A pelvic exam and Pap test are done to diagnose the condition. If abnormalities are found during the exam or Pap test, the Pap test may be repeated in 3 months, or your health care provider may do additional tests or procedures, such as:   A colposcopy. This is a procedure that uses a special microscope that allows the health care provider to magnify and closely examine the cells of the cervix, vagina, and vulva.  Cervical biopsies. This is a procedure where small tissue samples are taken from the cervix to be examined under a microscope by a specialist.   A cone biopsy. This is a procedure to test for or remove cancerous tissue. Other tests may be needed, including:   Cystoscopy.   Proctoscopy or sigmoidoscopy.  Ultrasound.   CT scan.   MRI.   Laparoscopy.  There are different stages of cervical cancer:   Stage 0, carcinoma in situ (CIS)--This first stage of cancer is the last and most serious stage of dysplasia.  Stage I--This means the tumor is in the uterus and cervix only.  Stage II--This means the tumor has spread to the upper vagina. The cancer has spread beyond the uterus but not to the pelvic walls or lower third of the vagina.  Stage III--This means the tumor has invaded the side wall of the pelvis and the lower third of the vagina. If the tumor blocks the tubes that carry urine to the bladder (ureters), it may cause urine to back up and the kidneys to swell (hydronephrosis).  Stage IV--This means  the tumor has spread to the rectum or bladder. In the later part of this stage, it has also spread to distant organs, like the lungs. TREATMENT  Treatment options can include:   Cone biopsy to remove the cancerous tissue.   Removal of the entire uterus and cervix.   Removal of the uterus, cervix, upper vagina, lymph nodes, and surrounding tissue (modified radical hysterectomy). The ovaries may be left in place or removed.  Medicines to treat cancer.   A combination of surgery, radiation, and chemotherapy.   Biological response modifiers. These are substances that help strengthen your immune system's fight against cancer or infection. They may be used in combination with chemotherapy. HOME CARE INSTRUCTIONS   Get a gynecology exam and Pap test once every year or as directed by your health care provider.   Get the HPV vaccine.  Do not smoke.  Do not have sexual intercourse until your health care provider says it is okay.  Use a condom every time you have sex. SEEK MEDICAL CARE IF:   You have increased pelvic pain or pressure.   Your are becoming increasingly tired.   You have increased leg or back pain.   You have a fever.  You have abnormal bleeding or discharge.  You lose weight. SEEK IMMEDIATE MEDICAL CARE IF:   You cannot urinate.  You have blood in your urine.   You have blood or pressure with a bowel movement.   You develop severe back, stomach, or pelvic pain. Document Released: 12/09/2005 Document Revised: 12/14/2013 Document Reviewed: 06/02/2013 [facility] Patient Information 2015 [place], [place]. This information is not intended to replace advice given to you by your health care provider. Make sure you discuss any questions you have with your health care provider.

## 2015-08-24 ENCOUNTER — Telehealth: Payer: Self-pay | Admitting: *Deleted

## 2015-08-24 ENCOUNTER — Other Ambulatory Visit: Payer: Self-pay | Admitting: Gynecologic Oncology

## 2015-08-24 ENCOUNTER — Ambulatory Visit: Payer: BLUE CROSS/BLUE SHIELD | Admitting: Obstetrics and Gynecology

## 2015-08-24 DIAGNOSIS — N6310 Unspecified lump in the right breast, unspecified quadrant: Secondary | ICD-10-CM

## 2015-08-24 NOTE — Telephone Encounter (Signed)
Left message on voicemail with details of Mammogram appointment . Pt is scheduled at the [facility] on 09/01/2015 '@10'$ :10am.  Requested pt to call back to the office to verify information was received.

## 2015-08-24 NOTE — Addendum Note (Signed)
Addended by: Joylene John D on: 08/24/2015 03:00 PM   Modules accepted: Orders

## 2015-08-25 ENCOUNTER — Ambulatory Visit: Payer: BLUE CROSS/BLUE SHIELD | Admitting: Gynecologic Oncology

## 2015-08-25 ENCOUNTER — Telehealth: Payer: Self-pay | Admitting: *Deleted

## 2015-08-25 NOTE — Telephone Encounter (Signed)
Per Joylene John, NP patient notified that the reason she is scheduled for a mammogram is a mass was noted in her right breast on the CT scan. Patient denies feeling any lumps or masses in either breast. Told patient that she needs additional imaging and work-up - patient states understanding and is agreeable to mammogram appt scheduled 09/01/15 at 10:30. Instructed patient to call our office with any additional questions or concerns prior to this appt.

## 2015-08-25 NOTE — Progress Notes (Signed)
GYN Location of Tumor / Histology: Cervical Cancer  Valerie Figueroa presented with vaginal bleeding in May that stopped and then had heavy vaginal bleeding in August and went to the ER.  Biopsies revealed:  08/16/15 Diagnosis Cervix, biopsy - SQUAMOUS CELL CARCINOMA.  Past/Anticipated interventions by Gyn/Onc surgery, if any: none  Past/Anticipated interventions by medical oncology, if any: Per Dr. Alycia Rossetti: "If on PET scan it appears that this is consistent with stage IV cervical cancer with pulmonary disease we would start with paclitaxel carboplatin with or without bevacizumab as primary treatment.Marland KitchenMarland KitchenIf on PET scan it appears that her disease is limited to the pelvis that she will undergo definitive chemoradiation with weekly cisplatin."   Weight changes, if any: has lost 3 lbs since 08/16/15  Bowel/Bladder complaints, if any: takes miralax for constipation, denies bladder issues  Nausea/Vomiting, if any: no  Pain issues, if any:  Yes - occasional pain in her left lower abdomen and lower back.  Reports pain stops when she starts bleeding.  SAFETY ISSUES:  Prior radiation? no  Pacemaker/ICD? no  Possible current pregnancy? no  Is the patient on methotrexate? no  Current Complaints / other details:  PET scan scheduled for 09/06/15.  Patient also had a CT of scan of her chest which showed an "Oval mass within the upper-outer quadrant of the right breast."  She will have mammogram on 09/01/15.  Reports She was taking Megace and ran out last Wednesday.  She started having vaginal bleeding with dark red clots on Friday.  She has had bleeding off and on since Friday.    BP 155/73 mmHg  Pulse 108  Temp(Src) 98.5 F (36.9 C) (Oral)  Resp 20  Ht '5\' 4"'$  (1.626 m)  Wt 131 lb 14.4 oz (59.829 kg)  BMI 22.63 kg/m2  SpO2 100%  LMP 08/25/2015   Wt Readings from Last 3 Encounters:  08/30/15 131 lb 14.4 oz (59.829 kg)  08/23/15 136 lb 6.4 oz (61.871 kg)  08/16/15 134 lb (60.782 kg)

## 2015-08-25 NOTE — Telephone Encounter (Signed)
Left message with appointment details. Asked pt to please call GYN ONC to verify  Appointment details

## 2015-08-27 ENCOUNTER — Other Ambulatory Visit: Payer: Self-pay | Admitting: Oncology

## 2015-08-27 DIAGNOSIS — C539 Malignant neoplasm of cervix uteri, unspecified: Secondary | ICD-10-CM

## 2015-08-29 ENCOUNTER — Other Ambulatory Visit: Payer: BLUE CROSS/BLUE SHIELD

## 2015-08-29 ENCOUNTER — Telehealth: Payer: Self-pay | Admitting: Nurse Practitioner

## 2015-08-29 NOTE — Telephone Encounter (Signed)
Patient unaware of lab and chemo edu class today 08/29/15. After speaking with [place], Chemo edu RN, OK for patient to come to class immediately after Dr. Sondra Come consult which is at 9:30. Lab apt moved as well, patient aware of new times for these apts.

## 2015-08-29 NOTE — Telephone Encounter (Signed)
Rn calling to verify appointments for this week. Left message to return call to [facility] at [facility] [phone].

## 2015-08-30 ENCOUNTER — Other Ambulatory Visit: Payer: BLUE CROSS/BLUE SHIELD

## 2015-08-30 ENCOUNTER — Ambulatory Visit
Admission: RE | Admit: 2015-08-30 | Discharge: 2015-08-30 | Disposition: A | Discharge status: Home / Self Care | Payer: BLUE CROSS/BLUE SHIELD | Source: Ambulatory Visit | Attending: Radiation Oncology | Admitting: Radiation Oncology

## 2015-08-30 ENCOUNTER — Encounter: Payer: Self-pay | Admitting: Radiation Oncology

## 2015-08-30 ENCOUNTER — Other Ambulatory Visit (HOSPITAL_BASED_OUTPATIENT_CLINIC_OR_DEPARTMENT_OTHER): Payer: BLUE CROSS/BLUE SHIELD

## 2015-08-30 ENCOUNTER — Telehealth: Payer: Self-pay | Admitting: *Deleted

## 2015-08-30 VITALS — BP 155/73 | HR 108 | Temp 98.5°F | Resp 20 | Ht 64.0 in | Wt 131.9 lb

## 2015-08-30 DIAGNOSIS — Z510 Encounter for antineoplastic radiation therapy: Secondary | ICD-10-CM | POA: Insufficient documentation

## 2015-08-30 DIAGNOSIS — C539 Malignant neoplasm of cervix uteri, unspecified: Secondary | ICD-10-CM

## 2015-08-30 DIAGNOSIS — N939 Abnormal uterine and vaginal bleeding, unspecified: Secondary | ICD-10-CM | POA: Diagnosis not present

## 2015-08-30 HISTORY — DX: Malignant neoplasm of cervix uteri, unspecified: C53.9

## 2015-08-30 LAB — IRON AND TIBC CHCC
%SAT: 6 % — AB (ref 21–57)
Iron: 22 ug/dL — ABNORMAL LOW (ref 41–142)
TIBC: 390 ug/dL (ref 236–444)
UIBC: 368 ug/dL (ref 120–384)

## 2015-08-30 LAB — CBC WITH DIFFERENTIAL/PLATELET
BASO%: 0.1 % (ref 0.0–2.0)
Basophils Absolute: 0 10*3/uL (ref 0.0–0.1)
EOS%: 0.6 % (ref 0.0–7.0)
Eosinophils Absolute: 0.1 10*3/uL (ref 0.0–0.5)
HCT: 28.6 % — ABNORMAL LOW (ref 34.8–46.6)
HGB: 8.8 g/dL — ABNORMAL LOW (ref 11.6–15.9)
LYMPH#: 2.4 10*3/uL (ref 0.9–3.3)
LYMPH%: 22.4 % (ref 14.0–49.7)
MCH: 26 pg (ref 25.1–34.0)
MCHC: 30.8 g/dL — AB (ref 31.5–36.0)
MCV: 84.4 fL (ref 79.5–101.0)
MONO#: 1.1 10*3/uL — ABNORMAL HIGH (ref 0.1–0.9)
MONO%: 9.9 % (ref 0.0–14.0)
NEUT#: 7.3 10*3/uL — ABNORMAL HIGH (ref 1.5–6.5)
NEUT%: 67 % (ref 38.4–76.8)
Platelets: 378 10*3/uL (ref 145–400)
RBC: 3.39 10*6/uL — AB (ref 3.70–5.45)
RDW: 24.5 % — ABNORMAL HIGH (ref 11.2–14.5)
WBC: 10.9 10*3/uL — ABNORMAL HIGH (ref 3.9–10.3)

## 2015-08-30 LAB — COMPREHENSIVE METABOLIC PANEL (CC13)
ALT: 14 U/L (ref 0–55)
AST: 14 U/L (ref 5–34)
Albumin: 3.6 g/dL (ref 3.5–5.0)
Alkaline Phosphatase: 88 U/L (ref 40–150)
Anion Gap: 11 mEq/L (ref 3–11)
BUN: 10.5 mg/dL (ref 7.0–26.0)
CO2: 18 meq/L — AB (ref 22–29)
CREATININE: 0.9 mg/dL (ref 0.6–1.1)
Calcium: 9.8 mg/dL (ref 8.4–10.4)
Chloride: 110 mEq/L — ABNORMAL HIGH (ref 98–109)
EGFR: 73 mL/min/{1.73_m2} — ABNORMAL LOW (ref >90)
Glucose: 125 mg/dl (ref 70–140)
POTASSIUM: 4.6 meq/L (ref 3.5–5.1)
SODIUM: 139 meq/L (ref 136–145)
Total Bilirubin: 0.27 mg/dL (ref 0.20–1.20)
Total Protein: 7.4 g/dL (ref 6.4–8.3)

## 2015-08-30 NOTE — Progress Notes (Signed)
Please see the Nurse Progress Note in the MD Initial Consult Encounter for this patient. 

## 2015-08-30 NOTE — Progress Notes (Signed)
  Radiation Oncology         [phone] ________________________________  Name: Valerie Figueroa MRN: [id number]  Date: 08/30/2015  DOB: [birth year]  SIMULATION AND TREATMENT PLANNING NOTE   DIAGNOSIS:  59 year old woman with clinical IIIB squamous cell carcinoma cervix.  NARRATIVE:  The patient was brought to the [place].  Identity was confirmed.  All relevant records and images related to the planned course of therapy were reviewed.  The patient freely provided informed written consent to proceed with treatment after reviewing the details related to the planned course of therapy. The consent form was witnessed and verified by the simulation staff.  Then, the patient was set-up in a stable reproducible  supine position for radiation therapy.  CT images were obtained.  Surface markings were placed.  The CT images were loaded into the planning software.  Then the target and avoidance structures were contoured.  Treatment planning then occurred.  The radiation prescription was entered and confirmed.  Then, I designed and supervised the construction of a total of 5 medically necessary complex treatment devices.  I have requested : 3D Simulation  I have requested a DVH of the following structures: GTV, PTV, bladder, rectum, bowel, fem head/neck.  I have ordered:dose calc.  PLAN:  The patient will receive 9 Gy in 3 fractions directed at the pelvis area in light of the patient's significant vaginal bleeding. Patient will begin her treatments tomorrow. Next week she will have completion of her PET scan and determination of the overall treatment plan will be made at that time.  This document serves as a record of services personally performed by Gery Pray, MD. It was created on his behalf by Arlyce Harman, a trained medical scribe. The creation of this record is based on the scribe's personal observations and the provider's statements to them. This document has been checked  and approved by the attending provider. -----------------------------------  Blair Promise, PhD, MD

## 2015-08-30 NOTE — Progress Notes (Signed)
Radiation Oncology         [phone] ________________________________  Initial Outpatient Consultation  Name: Valerie Figueroa MRN: [id number]  Date: 08/30/2015  DOB: 07-Aug-1956  ZD:GLOVFIEP,PIRJ V, MD  Nancy Marus, MD   REFERRING PHYSICIAN: Nancy Marus, MD  DIAGNOSIS:  59 year old woman with clinical IIIB squamous cell carcinoma cervix.   HISTORY OF PRESENT ILLNESS::Valerie Figueroa is a 59 y.o. female who is seen at the courtesy of Dr. Nancy Marus for newly diagnosed cervical cancer.  Patient is a 28 gravida 3 para 3 who was in her usual state of health until May of this year. At that time she began having some vaginal bleeding which stopped spontaneously. She then presented to her local emergency room in August with heavy vaginal bleeding. She was given IV Premarin as well as discharged on oral Premarin. That did help decrease her bleeding. She had a cervical biopsy performed on August 24 that revealed squamous cell carcinoma. She was seen by Dr. Glo Herring in the office and had a large fungating cervical mass with at least parametrial involvement appreciated. The patient was referred to Dr. Alycia Rossetti and on clinical examination the patient was noted to have stage IIIB cervical cancer with bilateral pelvic sidewall involvement. CT scan of the chest abdomen and pelvis confirmed a large cervical mass as well as pelvic and retroperitoneal adenopathy. In addition there was question of rib metastasis as well as pulmonary metastasis. Patient is scheduled for PET scan next week to determine if she has stage IIIB or stage IV disease. Given the patient's significant vaginal bleeding she is now referred to radiation oncology prior to completion of her staging workup.  She will likely receive radiation therapy either as palliative treatment or definitive course of chemoradiation.   PREVIOUS RADIATION THERAPY: No  PAST MEDICAL HISTORY:  has a past medical history of Anemia and Cervical  cancer.    PAST SURGICAL HISTORY: Past Surgical History  Procedure Laterality Date  . Ovarian cyst removal Left 01-17-1985  . Carpal tunnel release Bilateral   . Tubal ligation      FAMILY HISTORY: family history includes Cancer in her father. Father lung cancer, died at 73 yo. No family history of breast cancer.   SOCIAL HISTORY:  reports that she has been smoking.  She has never used smokeless tobacco. She reports that she drinks alcohol. She reports that she does not use illicit drugs.  Currently smokes 0.5 ppd.  ALLERGIES: Review of patient's allergies indicates no known allergies.  MEDICATIONS:  Current Outpatient Prescriptions  Medication Sig Dispense Refill  . ferrous sulfate 325 (65 FE) MG tablet Take 1 tablet (325 mg total) by mouth 3 (three) times daily with meals. 90 tablet 0  . ibuprofen (ADVIL,MOTRIN) 200 MG tablet Take 400 mg by mouth every 6 (six) hours as needed for mild pain or moderate pain.    . Multiple Vitamins-Minerals (CENTRUM ULTRA WOMENS) TABS Take 1 tablet by mouth every morning.    . polyethylene glycol (MIRALAX / GLYCOLAX) packet Take 17 g by mouth daily.    . megestrol (MEGACE) 40 MG tablet Take 3 tablets (120 mg total) by mouth daily. (Patient not taking: Reported on 08/30/2015) 42 tablet 0   No current facility-administered medications for this encounter.    REVIEW OF SYSTEMS:  A 15 point review of systems is documented in the electronic medical record. This was obtained by the nursing staff. However, I reviewed this with the patient to discuss relevant findings and make appropriate  changes.   PET scan scheduled for 09/06/15. Patient also had a CT of scan of her chest which showed an "Oval mass within the upper-outer quadrant of the right breast." She will have mammogram on 09/01/15. She will see Dr. Marko Plume on 09/01/15. Reports She was taking Megace and ran out last Wednesday. She started having vaginal bleeding with dark red clots on Friday. She has had  bleeding off and on since Friday. She describes the bleeding as mostly dark red clots with some dripping of blood. She has been experiencing LLQ dull pain which radiates to her back. The pain alleviates itself when she bleeds; she has been taking advil, as well. Denies appetite changes. She lives in [place]. Currently smokes about 0.5 ppd. She has been experiencing fatigue for the last few weeks, noting weakness in her legs. This morning she felt lightheaded. Denies headaches, visual changes, or double vision. She works full-time with [employer] at [employer] location.    PHYSICAL EXAM:  height is '5\' 4"'$  (1.626 m) and weight is 131 lb 14.4 oz (59.829 kg). Her oral temperature is 98.5 F (36.9 C). Her blood pressure is 155/73 and her pulse is 108. Her respiration is 20 and oxygen saturation is 100%.    Well-nourished, well-developed female in no acute distress. Neck: Supple, no lymphadenopathy no thyromegaly. No palpable cervicle or supraclavicular adenopathy. Mouth: Partial on top; few teeth missing on bottom. Lungs: Clear to auscultation.  Patient has a one and half centimeter palpable mass in the upper outer quadrant of the right breast approximately 3 cm from the areolar border. No nipple discharge or bleeding. The mass is best appreciated in the supine position. Cardiac: Regular rate and rhythm. Abdomen: . Abdomen is soft, nontender, nondistended. There are no palpable masses or hepatosplenomegaly. Groins: No lymphadenopathy. Extremities: No peripheral edema. Pulses are good. Skin: Birth mark from right calf to right lower back area. Pelvic: External genitalia within normal limits. Speculum examination reveals a large necrotic mass at the top of the vagina with blood clots. There is  active bleeding. Bimanual examination reveals an 8 cm mass replacing the cervix. On rectovaginal examination there is bilateral sidewall involvement, more so along the patient's right pelvis The  induration to the side wall is smooth. There is no nodularity. Questioned whether she has a metastases to the distal posterior aspect of the vagina. Bright red blood noted in the vaginal vault. Bleeds easily during exam.   Procedure note: Vaginal packing  In light of the patient's significant vaginal bleeding she proceeded to undergo packing of the vaginal vault with sterile gauze. Patient did present to the chemotherapy education class and continued to have significant vaginal bleeding. She was subsequently brought down to the radiation oncology department. The patient then had placement of monsels along the bleeding area noted along the cervical mass. The patient's bleeding eventually subsided. She then had packing of the vaginal vault with sterile gauze. Patient will return tomorrow after her first radiation treatment for removal of her packing.   ECOG = 1  LABORATORY DATA:  Lab Results  Component Value Date   WBC 10.9* 08/30/2015   HGB 8.8* 08/30/2015   HCT 28.6* 08/30/2015   MCV 84.4 08/30/2015   PLT 378 08/30/2015   NEUTROABS 7.3* 08/30/2015   Lab Results  Component Value Date   NA 139 08/30/2015   K 4.6 08/30/2015   CL 105 08/16/2015   CO2 18* 08/30/2015   GLUCOSE 125 08/30/2015   CREATININE 0.9 08/30/2015  CALCIUM 9.8 08/30/2015      RADIOGRAPHY: Ct Chest W Contrast  08/21/2015   CLINICAL DATA:  Patient with vaginal bleeding for 2 weeks. Mass on cervix seen on prior ultrasound. Recent biopsy demonstrated squamous cell carcinoma of the cervix.  EXAM: CT CHEST, ABDOMEN, AND PELVIS WITH CONTRAST  TECHNIQUE: Multidetector CT imaging of the chest, abdomen and pelvis was performed following the standard protocol during bolus administration of intravenous contrast.  CONTRAST:  118m OMNIPAQUE IOHEXOL 300 MG/ML  SOLN  COMPARISON:  None.  FINDINGS: CT CHEST FINDINGS  Mediastinum/Nodes: There is a 1.7 cm oval mass within the upper-outer quadrant of the right breast. Visualized thyroid  is unremarkable. No enlarged axillary mediastinal or hilar lymphadenopathy. Multiple prominent mediastinal lymph nodes. Normal heart size. Coronary arterial vascular calcifications. No pericardial effusion.  Lungs/Pleura: Central airways are patent. Multiple bilateral pulmonary nodules are demonstrated including a 3 mm right lower lobe pulmonary nodule (image 40; series 6) ; a 2 mm right lower lobe nodule (image 40; series 6) a 2 mm left upper lobe pulmonary nodule (image 27; series 6) and a 1.3 cm nodular ground-glass area within the superior left lower lobe (image 29; series 6). Additional small scattered pulmonary nodules are demonstrated. Additionally within the right lower lobe there is a 2.7 x 1.8 cm irregular nodular opacity (image 47; series 6). No pleural effusion or pneumothorax.  CT ABDOMEN AND PELVIS FINDINGS  Hepatobiliary: Liver is normal in size and contour. No focal lesion identified. Multiple stones are demonstrated in the gallbladder lumen. No gallbladder wall thickening or pericholecystic fluid.  Pancreas: Unremarkable  Spleen: Unremarkable  Adrenals/Urinary Tract: Adrenal glands are normal. The right kidney is atrophic when compared to the left kidney. Kidneys enhance symmetrically with contrast. There is enhancement of the urothelium involving the right renal collecting system and proximal right ureter. Urinary bladder is decompressed. There is a small amount stranding about the right kidney. Posterior to the right kidney there is an irregular opacity measuring up to 7 mm (image 70; series 2). Delayed images demonstrate asymmetric nephrogram.  Stomach/Bowel: Oral contrast material is demonstrated to the level of the rectum. No abnormal bowel wall thickening or evidence for bowel obstruction. The appendix is normal. No free fluid or free intraperitoneal air.  Vascular/Lymphatic: Normal caliber abdominal aorta. Multiple prominent and enlarged retroperitoneal lymph nodes are demonstrated including  a 13 mm left periaortic lymph node (image 71; series 2) and a 0.9 cm right common iliac lymph node (image 89; series 2). There is a 13 mm right external iliac lymph node (image 102; series 2) and a 0.8 cm left external iliac lymph node (image 109; series 2).  Other: There is a 4.2 x 5.2 x 6.1 cm irregular thick-walled mass involving the cervix and lower uterine segment (image 111; series 2). There is surrounding fat stranding. There is dilatation of the endometrium.  Musculoskeletal: 10 mm sclerotic focus within the lateral left fifth rib (image 25; series 2).  IMPRESSION: Large irregular mass involving the cervix and lower uterine segment compatible with recently biopsied cervical malignancy. There is associated dilatation of the endometrial canal. Multiple enlarged retroperitoneal and pelvic lymph nodes concerning for metastatic adenopathy.  Abnormal enhancement of the urothelium involving the right renal collecting system and proximal right ureter raising the possibility of an infectious process. Recommend correlation with urinalysis. Posterior to the right kidney there is irregular soft tissue density material which may represent a small amount of infectious/inflammatory stranding of the perinephric fat.  Irregular nodular opacity  within the right lower lobe measuring up to 2.7 cm may be secondary to an infectious or inflammatory process however is concerning for metastatic disease in the setting of known cervical malignancy or possible primary pulmonary malignancy.  Multiple additional 2-3 mm nodules within the lungs bilaterally may be infectious or inflammatory in etiology or potentially secondary to metastatic disease.  10 mm sclerotic focus within the lateral left fifth rib may represent a bone island or osseous metastasis.  Oval mass within the upper-outer quadrant of the right breast. Recommend dedicated evaluation with mammography if not previously evaluated.  These results will be called to the ordering  clinician or representative by the Radiologist Assistant, and communication documented in the PACS or zVision Dashboard.   Electronically Signed   By: Lovey Newcomer M.D.   On: 08/21/2015 16:37   Ct Abdomen Pelvis W Contrast  08/21/2015   CLINICAL DATA:  Patient with vaginal bleeding for 2 weeks. Mass on cervix seen on prior ultrasound. Recent biopsy demonstrated squamous cell carcinoma of the cervix.  EXAM: CT CHEST, ABDOMEN, AND PELVIS WITH CONTRAST  TECHNIQUE: Multidetector CT imaging of the chest, abdomen and pelvis was performed following the standard protocol during bolus administration of intravenous contrast.  CONTRAST:  133m OMNIPAQUE IOHEXOL 300 MG/ML  SOLN  COMPARISON:  None.  FINDINGS: CT CHEST FINDINGS  Mediastinum/Nodes: There is a 1.7 cm oval mass within the upper-outer quadrant of the right breast. Visualized thyroid is unremarkable. No enlarged axillary mediastinal or hilar lymphadenopathy. Multiple prominent mediastinal lymph nodes. Normal heart size. Coronary arterial vascular calcifications. No pericardial effusion.  Lungs/Pleura: Central airways are patent. Multiple bilateral pulmonary nodules are demonstrated including a 3 mm right lower lobe pulmonary nodule (image 40; series 6) ; a 2 mm right lower lobe nodule (image 40; series 6) a 2 mm left upper lobe pulmonary nodule (image 27; series 6) and a 1.3 cm nodular ground-glass area within the superior left lower lobe (image 29; series 6). Additional small scattered pulmonary nodules are demonstrated. Additionally within the right lower lobe there is a 2.7 x 1.8 cm irregular nodular opacity (image 47; series 6). No pleural effusion or pneumothorax.  CT ABDOMEN AND PELVIS FINDINGS  Hepatobiliary: Liver is normal in size and contour. No focal lesion identified. Multiple stones are demonstrated in the gallbladder lumen. No gallbladder wall thickening or pericholecystic fluid.  Pancreas: Unremarkable  Spleen: Unremarkable  Adrenals/Urinary Tract:  Adrenal glands are normal. The right kidney is atrophic when compared to the left kidney. Kidneys enhance symmetrically with contrast. There is enhancement of the urothelium involving the right renal collecting system and proximal right ureter. Urinary bladder is decompressed. There is a small amount stranding about the right kidney. Posterior to the right kidney there is an irregular opacity measuring up to 7 mm (image 70; series 2). Delayed images demonstrate asymmetric nephrogram.  Stomach/Bowel: Oral contrast material is demonstrated to the level of the rectum. No abnormal bowel wall thickening or evidence for bowel obstruction. The appendix is normal. No free fluid or free intraperitoneal air.  Vascular/Lymphatic: Normal caliber abdominal aorta. Multiple prominent and enlarged retroperitoneal lymph nodes are demonstrated including a 13 mm left periaortic lymph node (image 71; series 2) and a 0.9 cm right common iliac lymph node (image 89; series 2). There is a 13 mm right external iliac lymph node (image 102; series 2) and a 0.8 cm left external iliac lymph node (image 109; series 2).  Other: There is a 4.2 x 5.2 x 6.1 cm  irregular thick-walled mass involving the cervix and lower uterine segment (image 111; series 2). There is surrounding fat stranding. There is dilatation of the endometrium.  Musculoskeletal: 10 mm sclerotic focus within the lateral left fifth rib (image 25; series 2).  IMPRESSION: Large irregular mass involving the cervix and lower uterine segment compatible with recently biopsied cervical malignancy. There is associated dilatation of the endometrial canal. Multiple enlarged retroperitoneal and pelvic lymph nodes concerning for metastatic adenopathy.  Abnormal enhancement of the urothelium involving the right renal collecting system and proximal right ureter raising the possibility of an infectious process. Recommend correlation with urinalysis. Posterior to the right kidney there is  irregular soft tissue density material which may represent a small amount of infectious/inflammatory stranding of the perinephric fat.  Irregular nodular opacity within the right lower lobe measuring up to 2.7 cm may be secondary to an infectious or inflammatory process however is concerning for metastatic disease in the setting of known cervical malignancy or possible primary pulmonary malignancy.  Multiple additional 2-3 mm nodules within the lungs bilaterally may be infectious or inflammatory in etiology or potentially secondary to metastatic disease.  10 mm sclerotic focus within the lateral left fifth rib may represent a bone island or osseous metastasis.  Oval mass within the upper-outer quadrant of the right breast. Recommend dedicated evaluation with mammography if not previously evaluated.  These results will be called to the ordering clinician or representative by the Radiologist Assistant, and communication documented in the PACS or zVision Dashboard.   Electronically Signed   By: Lovey Newcomer M.D.   On: 08/21/2015 16:37      IMPRESSION: 59 year old with clinical IIIB squamous cell carcinoma cervix. She may have stage IV disease based on CT scan that shows questionable pulmonary nodules. We will need to obtain a PET scan to better evaluate the pulmonary disease. A PET scan is scheduled for 09/06/15.   PLAN: We will proceed with simulation and treatment planning today in light of her significant vaginal bleeding. She will receive her first radiation treatment tomorrow and receive 3 high-dose fractions in light of her bleeding (9 gray in 3 fractions )  Need to address smoking cessation.     I spent 110 minutes minutes face to face with the patient and more than 50% of that time was spent in counseling and/or coordination of care.   This document serves as a record of services personally performed by Gery Pray, MD. It was created on his behalf by Arlyce Harman, a trained medical scribe. The  creation of this record is based on the scribe's personal observations and the provider's statements to them. This document has been checked and approved by the attending provider. ------------------------------------------------  Blair Promise, PhD, MD

## 2015-08-30 NOTE — Telephone Encounter (Signed)
No additional note

## 2015-08-31 ENCOUNTER — Ambulatory Visit
Admission: RE | Admit: 2015-08-31 | Discharge: 2015-08-31 | Disposition: A | Discharge status: Home / Self Care | Payer: BLUE CROSS/BLUE SHIELD | Source: Ambulatory Visit | Attending: Radiation Oncology | Admitting: Radiation Oncology

## 2015-08-31 ENCOUNTER — Encounter: Payer: Self-pay | Admitting: *Deleted

## 2015-08-31 ENCOUNTER — Other Ambulatory Visit: Payer: BLUE CROSS/BLUE SHIELD

## 2015-08-31 ENCOUNTER — Ambulatory Visit: Payer: BLUE CROSS/BLUE SHIELD | Admitting: Radiation Oncology

## 2015-08-31 DIAGNOSIS — C539 Malignant neoplasm of cervix uteri, unspecified: Secondary | ICD-10-CM | POA: Diagnosis not present

## 2015-08-31 NOTE — Progress Notes (Signed)
  Radiation Oncology         [phone] ________________________________  Name: Valerie Figueroa MRN: [id number]  Date: 08/31/2015  DOB: [birth year]  Weekly Radiation Therapy Management    ICD-9-CM ICD-10-CM   1. Cervix cancer 180.9 C53.9     Current Dose: 3 Gy     Planned Dose:  9 Gy  Narrative . . . . . . . . The patient presents for routine under treatment assessment.                                   The patient is without complaint. She denies any further vaginal bleeding since packing and monsels placed along the cervix.                                  Set-up films were reviewed.                                 The chart was checked. Physical Findings. . .  Weight essentially stable.  No significant changes.   Procedure Note: Pt underwent pelvic examination. The vaginal packing placed yesterday was removed without difficulty, no bleeding noted.    Impression . . . . . . . The patient is tolerating radiation. Plan . . . . . . . . . . . . Continue treatment as planned. Final treatment details pending at this time. Pt's PET scan scheduled September 14.  This document serves as a record of services personally performed by Gery Pray, MD. It was created on his behalf by Darcus Austin, a trained medical scribe. The creation of this record is based on the scribe's personal observations and the provider's statements to them. This document has been checked and approved by the attending provider.  ________________________________   Blair Promise, PhD, MD

## 2015-08-31 NOTE — Progress Notes (Signed)
  Radiation Oncology         [phone] ________________________________  Name: Valerie Figueroa MRN: [id number]  Date: 08/31/2015  DOB: 10-28-1956  Simulation Verification Note    ICD-9-CM ICD-10-CM   1. Cervix cancer 180.9 C53.9     Status: outpatient  NARRATIVE: The patient was brought to the treatment unit and placed in the planned treatment position. The clinical setup was verified. Then port films were obtained and uploaded to the radiation oncology medical record software.  The treatment beams were carefully compared against the planned radiation fields. The position location and shape of the radiation fields was reviewed. They targeted volume of tissue appears to be appropriately covered by the radiation beams. Organs at risk appear to be excluded as planned.  Based on my personal review, I approved the simulation verification. The patient's treatment will proceed as planned.  -----------------------------------  Blair Promise, PhD, MD

## 2015-09-01 ENCOUNTER — Encounter: Payer: Self-pay | Admitting: Oncology

## 2015-09-01 ENCOUNTER — Ambulatory Visit
Admission: RE | Admit: 2015-09-01 | Discharge: 2015-09-01 | Disposition: A | Discharge status: Home / Self Care | Payer: BLUE CROSS/BLUE SHIELD | Source: Ambulatory Visit | Attending: Radiation Oncology | Admitting: Radiation Oncology

## 2015-09-01 ENCOUNTER — Ambulatory Visit
Admission: RE | Admit: 2015-09-01 | Discharge: 2015-09-01 | Disposition: A | Discharge status: Home / Self Care | Payer: BLUE CROSS/BLUE SHIELD | Source: Ambulatory Visit | Attending: Gynecologic Oncology | Admitting: Gynecologic Oncology

## 2015-09-01 ENCOUNTER — Ambulatory Visit (HOSPITAL_BASED_OUTPATIENT_CLINIC_OR_DEPARTMENT_OTHER): Payer: BLUE CROSS/BLUE SHIELD | Admitting: Oncology

## 2015-09-01 ENCOUNTER — Telehealth: Payer: Self-pay | Admitting: *Deleted

## 2015-09-01 ENCOUNTER — Telehealth: Payer: Self-pay | Admitting: Oncology

## 2015-09-01 ENCOUNTER — Other Ambulatory Visit: Payer: Self-pay | Admitting: Gynecologic Oncology

## 2015-09-01 VITALS — BP 164/60 | HR 99 | Temp 97.8°F | Resp 18 | Ht 64.0 in | Wt 135.4 lb

## 2015-09-01 DIAGNOSIS — D500 Iron deficiency anemia secondary to blood loss (chronic): Secondary | ICD-10-CM | POA: Insufficient documentation

## 2015-09-01 DIAGNOSIS — K8020 Calculus of gallbladder without cholecystitis without obstruction: Secondary | ICD-10-CM | POA: Diagnosis not present

## 2015-09-01 DIAGNOSIS — Z23 Encounter for immunization: Secondary | ICD-10-CM | POA: Diagnosis not present

## 2015-09-01 DIAGNOSIS — N6310 Unspecified lump in the right breast, unspecified quadrant: Secondary | ICD-10-CM

## 2015-09-01 DIAGNOSIS — R918 Other nonspecific abnormal finding of lung field: Secondary | ICD-10-CM

## 2015-09-01 DIAGNOSIS — Z720 Tobacco use: Secondary | ICD-10-CM

## 2015-09-01 DIAGNOSIS — I2510 Atherosclerotic heart disease of native coronary artery without angina pectoris: Secondary | ICD-10-CM

## 2015-09-01 DIAGNOSIS — C539 Malignant neoplasm of cervix uteri, unspecified: Secondary | ICD-10-CM

## 2015-09-01 MED ORDER — INFLUENZA VAC SPLIT QUAD 0.5 ML IM SUSY
0.5000 mL | PREFILLED_SYRINGE | Freq: Once | INTRAMUSCULAR | Status: AC
  Administered 2015-09-01: 0.5 mL via INTRAMUSCULAR
  Filled 2015-09-01: qty 0.5

## 2015-09-01 MED ORDER — ONDANSETRON HCL 8 MG PO TABS: 8.0000 mg | ORAL_TABLET | Freq: Three times a day (TID) | ORAL | Status: DC | PRN

## 2015-09-01 MED ORDER — LORAZEPAM 0.5 MG PO TABS: ORAL_TABLET | ORAL | Status: DC

## 2015-09-01 NOTE — Telephone Encounter (Signed)
Called and left a message with her chemo on 9/19

## 2015-09-01 NOTE — Progress Notes (Signed)
[place] NEW PATIENT EVALUATION   Name: Valerie Figueroa Date: September 01, 2015  MRN: [id number] DOB: 1956/01/09  REFERRING PHYSICIAN: Nancy Marus cc [name], MD, Gery Pray. (Has used [facility] in [place], tho this facility does NOT function as primary care per MD phone call to that facility now)    REASON FOR REFERRAL: squamous cell carcinoma cervix, for consideration of chemotherapy. Staging still in process.   HISTORY OF PRESENT ILLNESS:Valerie Figueroa is a 59 y.o. female who is seen in consultation, alone for visit, at the request of Dr Nancy Marus, for consideration of chemotherapy for newly diagnosed cervical cancer, at least locally advanced. Patient is scheduled for PET on 09-06-15.  Patient has had no regular medical care in years, including no PAP in >15 years. She had no bleeding since menopause until several hours of heavy vaginal bleeding in 04-2015, which resolved other than intermittent spotting until extremely heavy vaginal bleeding again  08-08-15. She was seen in [facility] ED then, with Hgb 8.0; she was given premarin and continued on oral megace x 2 weeks. She was seen by Dr Mallory Shirk on 08-16-15 , with finding of large fungating cervical mass with parametrial involvement. 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 She had CT CAP in [facility] system on 08-21-15, with 1.7 cm oval mass upper outer right breast, multiple prominent mediastinal lymph nodes and multiiple bilateral pulmonary nodules including a 2.7 x 1.8 cm nodule in RLL., retroperitoneal and external iliac adenopathy, 4.2 x 5.2 x 6.1 cm mass involving cervix and lower uterine segment, 1 cm sclerotic lesion in lateral left fifth rib, liver ok and no ureteral obstruction. She saw Dr Alycia Rossetti on 08-23-15, who agreed with diagnosis of cervical cancer at least IIIB, possibly IV if  pulmonary mets. PET pending, recommendation if stage IV to use short course RT to control bleeding then carbo taxol +/- avastin; if PET shows pelvis only then radiation with sensitizing CDDP.  Patient saw Dr Sondra Come in consultation on 08-30-15, with heavy bleeding following his exam such that vaginal packing was done. She has begun urgent radiation due to the bleeding, planned thru 09-04-15. Vaginal packing was removed on 08-31-15, with no vaginal bleeding since then. Patient attended chemotherapy education class on 08-31-15, with teaching done for CDDP tho patient is aware that chemotherapy agents may change depending on results of upcoming imaging. She is for mammograms later today at [facility].   REVIEW OF SYSTEMS: No other bleeding.  Some pelvic cramping/ low back discomfort at times, uses advil 1-2x daily. No fever. Fatigue and SOB with exertion such as walking across parking lot. Some cough NP. No chest pain. No other bone pain, denies arthritis symptoms. No HA or neurologic symptoms. Reading glasses. Partial dentures, no dental problems that are symptomatic. Hearing good.  Occasional environmental allergies. No palpitations or other cardiac symptoms. Appetite good, weight seems stable, usual ~ 134. Previously constipated, now bowels moving well daily with miralax. No bladder symptoms except difficulty voiding with vaginal packing. No peripheral neuropathy. Not aware of changes in breasts. No change large birthmark right back and RLE. Tolerating oral iron without difficulty.  Remainder of full 10 point review of systems negative.   ALLERGIES: Review of patient's allergies indicates no known allergies.  PAST MEDICAL/ SURGICAL HISTORY:    NKDA G3P3 Never mammograms Never colonoscopy Left ovarian cyst removed 1986 without oophorectomy BTL 1986 Carpal tunnel release bilaterally 1993,  1995 Recently on ferrous sulfate tid   CURRENT MEDICATIONS: reviewed as listed now in EMR Scripts ondansetron and  lorazepam. She is instructed that she should not mix alcohol with sedating medications Flu vaccine given today.  Have recommended taking oral iron on empty stomach with OJ for best absorption.   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  Married. Cigarettes 1/2 ppd x 30 years, has stopped then resumed. Drinks beer daily.  85 grands and stepgrands ages 12 year to 64.   FAMILY HISTORY:  Mother died 64 (when patient age 49) Father died lung ca, smoker Brother died MVA age 71 Brother died "bowel rupture" 2 sons, 1 daughter healthy          PHYSICAL EXAM:  height is 5' 4"  (1.626 m) and weight is 135 lb 6.4 oz (61.417 kg). Her oral temperature is 97.8 F (36.6 C). Her blood pressure is 164/60 and her pulse is 99. Her respiration is 18 and oxygen saturation is 100%.  Alert, pleasant, cooperative lady looks stated age, good historian. Intermittent cough sounds NP. Easily mobile, NAD.  HEENT: normal hair pattern. PERRL, not icteric. Partial dentures, oral mucosa and posterior pharynx moist and clear. Neck supple without JVD or thyroid mass.   RESPIRATORY: cough as noted, respirations not labored with activity in exam room. No use of accessory muscles. Diminished BS thruout without wheezes or rales, no dullness to percussion.   CARDIAC/ VASCULAR: heart RRR clear heart sounds. Peripheral pulses present and symmetrical. Peripheral veins appear adequate for beginning chemo.  ABDOMEN: soft, not tender, no appreciable HSM or mass, normal bowel sounds.  LYMPH NODES: no cervical, supraclavicular, axillary or inguinal adenopathy  BREASTS: left without dominant mass,, skin or nipple findings. Right with thickening ~ 2x4 cm laterally, not tender, not hard, no overlying skin changes and otherwise not remarkable  right breast.   NEUROLOGIC: CN, motor, sensory, cerebellar nonfocal. PSYCH appropriate mood and affect  SKIN: no ecchymoses, rash, petechiae. Erythematous birthmark right flank and mid to lower right abdomen and posterior right LE  MUSCULOSKELETAL: back not tender. LE/ UE without pitting edema, cords, tenderness    LABORATORY DATA:  Results for orders placed or performed in visit on 08/30/15 (from the past 48 hour(s))  CBC with Differential     Status: Abnormal   Collection Time: 08/30/15 12:02 PM  Result Value Ref Range   WBC 10.9 (H) 3.9 - 10.3 10e3/uL   NEUT# 7.3 (H) 1.5 - 6.5 10e3/uL   HGB 8.8 (L) 11.6 - 15.9 g/dL   HCT 28.6 (L) 34.8 - 46.6 %   Platelets 378 145 - 400 10e3/uL   MCV 84.4 79.5 - 101.0 fL   MCH 26.0 25.1 - 34.0 pg   MCHC 30.8 (L) 31.5 - 36.0 g/dL   RBC 3.39 (L) 3.70 - 5.45 10e6/uL   RDW 24.5 (H) 11.2 - 14.5 %   lymph# 2.4 0.9 - 3.3 10e3/uL   MONO# 1.1 (H) 0.1 - 0.9 10e3/uL   Eosinophils Absolute 0.1 0.0 - 0.5 10e3/uL   Basophils Absolute 0.0 0.0 - 0.1 10e3/uL   NEUT% 67.0 38.4 - 76.8 %   LYMPH% 22.4 14.0 - 49.7 %   MONO% 9.9 0.0 - 14.0 %   EOS% 0.6 0.0 - 7.0 %   BASO% 0.1 0.0 - 2.0 %  Iron  and TIBC [facility]     Status: Abnormal   Collection Time: 08/30/15 12:02 PM  Result Value Ref Range   Iron 22 (L) 41 - 142 ug/dL   TIBC 390 236 - 444 ug/dL   UIBC 368 120 - 384 ug/dL   %SAT 6 (L) 21 - 57 %  Comprehensive metabolic panel (Cmet) - [facility]     Status: Abnormal   Collection Time: 08/30/15 12:02 PM  Result Value Ref Range   Sodium 139 136 - 145 mEq/L   Potassium 4.6 3.5 - 5.1 mEq/L   Chloride 110 (H) 98 - 109 mEq/L   CO2 18 (L) 22 - 29 mEq/L   Glucose 125 70 - 140 mg/dl   BUN 10.5 7.0 - 26.0 mg/dL   Creatinine 0.9 0.6 - 1.1 mg/dL   Total Bilirubin 0.27 0.20 - 1.20 mg/dL   Alkaline Phosphatase 88 40 - 150 U/L   AST 14 5 - 34 U/L   ALT 14 0 - 55 U/L   Total Protein 7.4 6.4 - 8.3 g/dL   Albumin 3.6 3.5 - 5.0 g/dL   Calcium 9.8 8.4 - 10.4 mg/dL   Anion  Gap 11 3 - 11 mEq/L   EGFR 73 (L) >90 ml/min/1.73 m2    Comment: eGFR is calculated using the CKD-EPI Creatinine Equation (2009)      PATHOLOGY: [name] Collected: 08/16/2015 Client: [facility] Accession: [id number] Received: 08/17/2015 Mallory Shirk, MD REPORT OF SURGICAL PATHOLOGY FINAL DIAGNOSIS Diagnosis Cervix, biopsy - SQUAMOUS CELL CARCINOMA. Microscopic Comment The specimens are involved by invasive squamous cell carcinoma, moderately to poorly differentiated with focal basaloid features.   RADIOGRAPHY: EXAM: CT CHEST, ABDOMEN, AND PELVIS WITH CONTRAST  08-21-15  COMPARISON: None.  FINDINGS: CT CHEST FINDINGS  Mediastinum/Nodes: There is a 1.7 cm oval mass within the upper-outer quadrant of the right breast. Visualized thyroid is unremarkable. No enlarged axillary mediastinal or hilar lymphadenopathy. Multiple prominent mediastinal lymph nodes. Normal heart size. Coronary arterial vascular calcifications. No pericardial effusion.  Lungs/Pleura: Central airways are patent. Multiple bilateral pulmonary nodules are demonstrated including a 3 mm right lower lobe pulmonary nodule (image 40; series 6) ; a 2 mm right lower lobe nodule (image 40; series 6) a 2 mm left upper lobe pulmonary nodule (image 27; series 6) and a 1.3 cm nodular ground-glass area within the superior left lower lobe (image 29; series 6). Additional small scattered pulmonary nodules are demonstrated. Additionally within the right lower lobe there is a 2.7 x 1.8 cm irregular nodular opacity (image 47; series 6). No pleural effusion or pneumothorax.  CT ABDOMEN AND PELVIS FINDINGS  Hepatobiliary: Liver is normal in size and contour. No focal lesion identified. Multiple stones are demonstrated in the gallbladder lumen. No gallbladder wall thickening or pericholecystic fluid.  Pancreas: Unremarkable  Spleen: Unremarkable  Adrenals/Urinary Tract: Adrenal glands  are normal. The right kidney is atrophic when compared to the left kidney. Kidneys enhance symmetrically with contrast. There is enhancement of the urothelium involving the right renal collecting system and proximal right ureter. Urinary bladder is decompressed. There is a small amount stranding about the right kidney. Posterior to the right kidney there is an irregular opacity measuring up to 7 mm (image 70; series 2). Delayed images demonstrate asymmetric nephrogram.  Stomach/Bowel: Oral contrast material is demonstrated to the level of the rectum. No abnormal bowel wall thickening or evidence for bowel obstruction. The appendix is normal. No free fluid or free intraperitoneal air.  Vascular/Lymphatic: Normal caliber  abdominal aorta. Multiple prominent and enlarged retroperitoneal lymph nodes are demonstrated including a 13 mm left periaortic lymph node (image 71; series 2) and a 0.9 cm right common iliac lymph node (image 89; series 2). There is a 13 mm right external iliac lymph node (image 102; series 2) and a 0.8 cm left external iliac lymph node (image 109; series 2).  Other: There is a 4.2 x 5.2 x 6.1 cm irregular thick-walled mass involving the cervix and lower uterine segment (image 111; series 2). There is surrounding fat stranding. There is dilatation of the endometrium.  Musculoskeletal: 10 mm sclerotic focus within the lateral left fifth rib (image 25; series 2).  IMPRESSION: Large irregular mass involving the cervix and lower uterine segment compatible with recently biopsied cervical malignancy. There is associated dilatation of the endometrial canal. Multiple enlarged retroperitoneal and pelvic lymph nodes concerning for metastatic adenopathy.  Abnormal enhancement of the urothelium involving the right renal collecting system and proximal right ureter raising the possibility of an infectious process. Recommend correlation with urinalysis. Posterior to the  right kidney there is irregular soft tissue density material which may represent a small amount of infectious/inflammatory stranding of the perinephric fat.  Irregular nodular opacity within the right lower lobe measuring up to 2.7 cm may be secondary to an infectious or inflammatory process however is concerning for metastatic disease in the setting of known cervical malignancy or possible primary pulmonary malignancy.  Multiple additional 2-3 mm nodules within the lungs bilaterally may be infectious or inflammatory in etiology or potentially secondary to metastatic disease.  10 mm sclerotic focus within the lateral left fifth rib may represent a bone island or osseous metastasis.  Oval mass within the upper-outer quadrant of the right breast. Recommend dedicated evaluation with mammography if not previously evaluated.   Mammograms pending at [facility] later today PET scheduled for 09-06-15   DISCUSSION: All of history above reviewed with patient now. We have discussed outpatient chemotherapy in general as well as need for hydration if CDDP, antiemetics and possible cytopenias. She understands that specific recommendations for treatment of the cervical cancer will be based on information from upcoming PET, in addition to information already available. Patient is aware that chemotherapy can be done by my partner at [facility], however at this point requests treatment in [place]. She understands that goal of these initial radiation treatments is to control bleeding. She will continue oral iron, but will take on empty stomach with OJ. If PET positive for metastatic disease or if concerns on breast imaging, I will need to see her again on 09-07-15. Altho unlikely, if PET and mammograms ok then I may not need to see her on 09-07-15 (that date not convenient with her work schedule).   Smoking cessation discussed and encouraged. She needs FMLA completed if not already  done   IMPRESSION / PLAN:  1. 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 Bleeding better in last 36 hours since start of radiation. Plan either sensitizing CDDP with radiation vs carbo taxol depending on PET information.   2.long and ongoing tobacco, tho not heavy smoker. Encouraged her to stop. Multiple bilateral pulmonary nodules, PET pending 3.iron deficiency anemia with recent heavy vaginal bleeding: continue oral iron with changes to optimize absorption, follow. Has not been transfused. Not symptomatic with limited exertion.  4.right breast mass upper outer quadrant: mammograms to be done today (none prior). No prior mammograms.  It would not be usual for cervical cancer to metastasize to breast. If appears malignant, diagnostic and surgical procedures will need to take into account what may be extensively metastatic cervical cancer. 5. Never colonoscopy 6.flu vaccine given today 7.post carpal tunnel release 8.sclerotic rib lesion on CT 9.CAD by CT 10.cholelithiasis by CT 11.abnormal enhancement right renal collecting system by CT  Patient had questions answered to her satisfaction and is in agreement with plan above.  Verbal consent pending final recommendations for chemo. She can contact this office for questions or concerns at any time prior to next scheduled visit.  Time spent  70 min , including >50% discussion and coordination of care. Cc Drs Alycia Rossetti, Rolm Gala, MD 09/01/2015 9:58 AM

## 2015-09-01 NOTE — Telephone Encounter (Signed)
Per staff message and POF I have scheduled appts. Advised scheduler of appts. JMW  

## 2015-09-01 NOTE — Telephone Encounter (Signed)
Appointments made and email to [facility] to add 9/19

## 2015-09-03 DIAGNOSIS — Z23 Encounter for immunization: Secondary | ICD-10-CM | POA: Insufficient documentation

## 2015-09-03 DIAGNOSIS — Z720 Tobacco use: Secondary | ICD-10-CM | POA: Insufficient documentation

## 2015-09-03 DIAGNOSIS — R918 Other nonspecific abnormal finding of lung field: Secondary | ICD-10-CM | POA: Insufficient documentation

## 2015-09-04 ENCOUNTER — Ambulatory Visit
Admission: RE | Admit: 2015-09-04 | Discharge: 2015-09-04 | Disposition: A | Discharge status: Home / Self Care | Payer: BLUE CROSS/BLUE SHIELD | Source: Ambulatory Visit | Attending: Radiation Oncology | Admitting: Radiation Oncology

## 2015-09-04 DIAGNOSIS — C539 Malignant neoplasm of cervix uteri, unspecified: Secondary | ICD-10-CM | POA: Diagnosis not present

## 2015-09-06 ENCOUNTER — Ambulatory Visit (HOSPITAL_COMMUNITY)
Admission: RE | Admit: 2015-09-06 | Discharge: 2015-09-06 | Disposition: A | Discharge status: Home / Self Care | Payer: BLUE CROSS/BLUE SHIELD | Source: Ambulatory Visit | Attending: Gynecologic Oncology | Admitting: Gynecologic Oncology

## 2015-09-06 DIAGNOSIS — K8020 Calculus of gallbladder without cholecystitis without obstruction: Secondary | ICD-10-CM | POA: Insufficient documentation

## 2015-09-06 DIAGNOSIS — C539 Malignant neoplasm of cervix uteri, unspecified: Secondary | ICD-10-CM | POA: Insufficient documentation

## 2015-09-06 DIAGNOSIS — N63 Unspecified lump in breast: Secondary | ICD-10-CM | POA: Insufficient documentation

## 2015-09-06 DIAGNOSIS — I2510 Atherosclerotic heart disease of native coronary artery without angina pectoris: Secondary | ICD-10-CM | POA: Insufficient documentation

## 2015-09-06 DIAGNOSIS — R590 Localized enlarged lymph nodes: Secondary | ICD-10-CM | POA: Insufficient documentation

## 2015-09-06 LAB — GLUCOSE, CAPILLARY: Glucose-Capillary: 131 mg/dL — ABNORMAL HIGH (ref 65–99)

## 2015-09-06 MED ORDER — FLUDEOXYGLUCOSE F - 18 (FDG) INJECTION
6.7300 | Freq: Once | INTRAVENOUS | Status: DC | PRN
  Administered 2015-09-06: 6.73 via INTRAVENOUS
  Filled 2015-09-06: qty 6.73

## 2015-09-06 NOTE — Telephone Encounter (Signed)
Phone cal taken care of

## 2015-09-07 ENCOUNTER — Ambulatory Visit (HOSPITAL_BASED_OUTPATIENT_CLINIC_OR_DEPARTMENT_OTHER): Payer: BLUE CROSS/BLUE SHIELD | Admitting: Oncology

## 2015-09-07 ENCOUNTER — Other Ambulatory Visit: Payer: Self-pay | Admitting: Oncology

## 2015-09-07 ENCOUNTER — Other Ambulatory Visit (HOSPITAL_BASED_OUTPATIENT_CLINIC_OR_DEPARTMENT_OTHER): Payer: BLUE CROSS/BLUE SHIELD

## 2015-09-07 ENCOUNTER — Encounter: Payer: Self-pay | Admitting: Oncology

## 2015-09-07 VITALS — BP 161/57 | HR 105 | Temp 98.2°F | Resp 18 | Ht 64.0 in | Wt 133.9 lb

## 2015-09-07 DIAGNOSIS — N63 Unspecified lump in breast: Secondary | ICD-10-CM | POA: Diagnosis not present

## 2015-09-07 DIAGNOSIS — C539 Malignant neoplasm of cervix uteri, unspecified: Secondary | ICD-10-CM

## 2015-09-07 DIAGNOSIS — D500 Iron deficiency anemia secondary to blood loss (chronic): Secondary | ICD-10-CM | POA: Diagnosis not present

## 2015-09-07 DIAGNOSIS — Z720 Tobacco use: Secondary | ICD-10-CM

## 2015-09-07 DIAGNOSIS — N6310 Unspecified lump in the right breast, unspecified quadrant: Secondary | ICD-10-CM

## 2015-09-07 DIAGNOSIS — R918 Other nonspecific abnormal finding of lung field: Secondary | ICD-10-CM

## 2015-09-07 LAB — CBC WITH DIFFERENTIAL/PLATELET
BASO%: 0.1 % (ref 0.0–2.0)
BASOS ABS: 0 10*3/uL (ref 0.0–0.1)
EOS%: 2 % (ref 0.0–7.0)
Eosinophils Absolute: 0.2 10*3/uL (ref 0.0–0.5)
HCT: 26.3 % — ABNORMAL LOW (ref 34.8–46.6)
HEMOGLOBIN: 8 g/dL — AB (ref 11.6–15.9)
LYMPH%: 9.8 % — ABNORMAL LOW (ref 14.0–49.7)
MCH: 27.2 pg (ref 25.1–34.0)
MCHC: 30.4 g/dL — ABNORMAL LOW (ref 31.5–36.0)
MCV: 89.5 fL (ref 79.5–101.0)
MONO#: 0.8 10*3/uL (ref 0.1–0.9)
MONO%: 9.5 % (ref 0.0–14.0)
NEUT#: 6.3 10*3/uL (ref 1.5–6.5)
NEUT%: 78.6 % — ABNORMAL HIGH (ref 38.4–76.8)
Platelets: 320 10*3/uL (ref 145–400)
RBC: 2.94 10*6/uL — ABNORMAL LOW (ref 3.70–5.45)
RDW: 23.7 % — AB (ref 11.2–14.5)
WBC: 8 10*3/uL (ref 3.9–10.3)
lymph#: 0.8 10*3/uL — ABNORMAL LOW (ref 0.9–3.3)

## 2015-09-07 LAB — COMPREHENSIVE METABOLIC PANEL (CC13)
ALBUMIN: 3.2 g/dL — AB (ref 3.5–5.0)
ALK PHOS: 94 U/L (ref 40–150)
ALT: 59 U/L — ABNORMAL HIGH (ref 0–55)
AST: 53 U/L — AB (ref 5–34)
Anion Gap: 10 mEq/L (ref 3–11)
BUN: 11.5 mg/dL (ref 7.0–26.0)
CHLORIDE: 107 meq/L (ref 98–109)
CO2: 21 mEq/L — ABNORMAL LOW (ref 22–29)
Calcium: 8.8 mg/dL (ref 8.4–10.4)
Creatinine: 0.7 mg/dL (ref 0.6–1.1)
EGFR: 87 mL/min/{1.73_m2} — ABNORMAL LOW (ref >90)
GLUCOSE: 131 mg/dL (ref 70–140)
POTASSIUM: 3.9 meq/L (ref 3.5–5.1)
SODIUM: 138 meq/L (ref 136–145)
Total Bilirubin: <0.2 mg/dL (ref 0.20–1.20)
Total Protein: 7.2 g/dL (ref 6.4–8.3)

## 2015-09-07 LAB — MAGNESIUM (CC13): Magnesium: 2.2 mg/dl (ref 1.5–2.5)

## 2015-09-07 MED ORDER — TRAMADOL HCL 50 MG PO TABS: 50.0000 mg | ORAL_TABLET | Freq: Three times a day (TID) | ORAL | Status: DC | PRN

## 2015-09-07 MED ORDER — MEGESTROL ACETATE 40 MG PO TABS: 120.0000 mg | ORAL_TABLET | Freq: Every day | ORAL | Status: DC

## 2015-09-07 NOTE — Progress Notes (Signed)
OFFICE PROGRESS NOTE   September 07, 2015   [place], [place], [place], MD, Gery Pray  INTERVAL HISTORY:  Patient is seen, alone for visit, as work up is in process for advanced cervical cancer complicated by symptomatic iron deficiency anemia, and breast mass. Vaginal bleeding has been minimal since urgent palliative RT by Dr Sondra Come last week. Bilateral mammograms and [place] at [facility] last week identified 2 masses in right breast, with [place] core needle biopsy planned on 09-08-15. PET 09-06-15 has uptake cervical mass, periaortic and iliac nodes, left supraclavicular/ left subpectoral and left axillary nodes and in right breast mass.   Patient has had no heavy vaginal bleeding since 08-30-15, but continues with minimal vaginal spotting intermittently. She has occasional low back and pelvic cramping pain for which she was using OTC NSAID, that on hold for breast biopsy and using only tylenol prn now. She is SOB when she walks quickly or very far, not at rest. She continues oral iron on empty stomach with OJ. She has had no other bleeding. She is dizzy or lightheaded when stands at times, no HA or other neurologic symptoms, po intake not great. She denies chest pain, nausea, fever.   No PAC   ONCOLOGIC HISTORY  Patient has had no regular medical care in years, including no PAP in >15 years. She had no bleeding since menopause until several hours of heavy vaginal bleeding in 04-2015, which resolved other than intermittent spotting until extremely heavy vaginal bleeding again 08-08-15. She was seen in [facility] ED then, with Hgb 8.0; she was given premarin and continued on oral megace x 2 weeks. She was seen by Dr Mallory Shirk on 08-16-15 , with finding of large fungating cervical mass with parametrial involvement. 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 She had CT CAP in [facility] on 08-21-15, with 1.7 cm oval mass  upper outer right breast, multiple prominent mediastinal lymph nodes and multiiple bilateral pulmonary nodules including a 2.7 x 1.8 cm nodule in RLL., retroperitoneal and external iliac adenopathy, 4.2 x 5.2 x 6.1 cm mass involving cervix and lower uterine segment, 1 cm sclerotic lesion in lateral left fifth rib, liver ok and no ureteral obstruction. She saw Dr Alycia Rossetti on 08-23-15, who agreed with diagnosis of cervical cancer at least IIIB, possibly IV if pulmonary mets. PET pending, recommendation if stage IV to use short course RT to control bleeding then carbo taxol +/- avastin; if PET shows pelvis only then radiation with sensitizing CDDP. Patient saw Dr Sondra Come in consultation on 08-30-15, with vaginal packing required for heavy bleeding. She had urgent radiation thru 09-04-15. PET 09-06-15 had uptake in cervical mass, periaortic and iliac nodes, left supraclavicular node, left subpectoral and axillary nodes, and in right breast mass.     Review of systems as above, also: No fever or symptoms of infection.  Remainder of 10 point Review of Systems negative.  Objective:  Vital signs in last 24 hours:  BP 161/57 mmHg  Pulse 105  Temp(Src) 98.2 F (36.8 C) (Oral)  Resp 18  Ht 5' 4"  (1.626 m)  Wt 133 lb 14.4 oz (60.737 kg)  BMI 22.97 kg/m2  LMP 08/25/2015 Weight down 2 lbs. Alert, oriented and appropriate, very pleasant. Ambulatory without assistance.  No alopecia  HEENT:PERRL, sclerae not icteric. Oral mucosa moist without lesions, posterior pharynx clear. Mucous membranes pale Neck supple. No JVD.  Lymphatics:small nontender left supraclavicular adenopathy. No axillary nodes appreciated. Resp: somewhat  diminished BS thruout otherwise clear to auscultation and no dullness to percussion bilaterally Cardio: regular rate and rhythm. No gallop. GI: soft, nontender, not distended, no mass or organomegaly. Normally active bowel sounds. Surgical incision not remarkable. Musculoskeletal/  Extremities: without pitting edema, cords, tenderness Neuro: no peripheral neuropathy. Otherwise nonfocal. PSYCH appropriate mood and affect Skin without rash, ecchymosis, petechiae Breasts: Left without dominant mass, skin or nipple findings. Right with smooth mass laterally, not fixed or firm. No skin changes. Axillae benign.   Lab Results:  Results for orders placed or performed in visit on 09/07/15  CBC with Differential  Result Value Ref Range   WBC 8.0 3.9 - 10.3 10e3/uL   NEUT# 6.3 1.5 - 6.5 10e3/uL   HGB 8.0 (L) 11.6 - 15.9 g/dL   HCT 26.3 (L) 34.8 - 46.6 %   Platelets 320 145 - 400 10e3/uL   MCV 89.5 79.5 - 101.0 fL   MCH 27.2 25.1 - 34.0 pg   MCHC 30.4 (L) 31.5 - 36.0 g/dL   RBC 2.94 (L) 3.70 - 5.45 10e6/uL   RDW 23.7 (H) 11.2 - 14.5 %   lymph# 0.8 (L) 0.9 - 3.3 10e3/uL   MONO# 0.8 0.1 - 0.9 10e3/uL   Eosinophils Absolute 0.2 0.0 - 0.5 10e3/uL   Basophils Absolute 0.0 0.0 - 0.1 10e3/uL   NEUT% 78.6 (H) 38.4 - 76.8 %   LYMPH% 9.8 (L) 14.0 - 49.7 %   MONO% 9.5 0.0 - 14.0 %   EOS% 2.0 0.0 - 7.0 %   BASO% 0.1 0.0 - 2.0 %  Comprehensive metabolic panel (Cmet) - [facility]  Result Value Ref Range   Sodium 138 136 - 145 mEq/L   Potassium 3.9 3.5 - 5.1 mEq/L   Chloride 107 98 - 109 mEq/L   CO2 21 (L) 22 - 29 mEq/L   Glucose 131 70 - 140 mg/dl   BUN 11.5 7.0 - 26.0 mg/dL   Creatinine 0.7 0.6 - 1.1 mg/dL   Total Bilirubin <0.20 0.20 - 1.20 mg/dL   Alkaline Phosphatase 94 40 - 150 U/L   AST 53 (H) 5 - 34 U/L   ALT 59 (H) 0 - 55 U/L   Total Protein 7.2 6.4 - 8.3 g/dL   Albumin 3.2 (L) 3.5 - 5.0 g/dL   Calcium 8.8 8.4 - 10.4 mg/dL   Anion Gap 10 3 - 11 mEq/L   EGFR 87 (L) >90 ml/min/1.73 m2  Magnesium  Result Value Ref Range   Magnesium 2.2 1.5 - 2.5 mg/dl    Iron studies from 08-30-15 have serum iron 22 and %sat 6  Studies/Results:  Nm Pet Image Initial (pi) Skull Base To Thigh  09/06/2015   CLINICAL DATA:  Initial treatment strategy for cervical cancer.  EXAM: NUCLEAR  MEDICINE PET SKULL BASE TO THIGH  TECHNIQUE: 6.7 mCi F-18 FDG was injected intravenously. Full-ring PET imaging was performed from the skull base to thigh after the radiotracer. CT data was obtained and used for attenuation correction and anatomic localization.  FASTING BLOOD GLUCOSE:  Value: 138 mg/dl  COMPARISON:  CT chest abdomen pelvis 08/21/2015.  FINDINGS: NECK  No hypermetabolic lymph nodes in the neck. CT images show no acute findings.  CHEST  Left supraclavicular lymph node measures 8 mm with an SUV max of 2.6. Left subpectoral and left axillary lymph nodes measure up to 9 mm in short axis, with an SUV max of 7.5. 13 mm nodule in the lateral right breast has an SUV max of  2.2. No hypermetabolic mediastinal or hilar lymph nodes. No hypermetabolic pulmonary nodules. CT images show coronary artery calcification. No pericardial or pleural effusion. Probable post infectious scarring in the right lower lobe.  ABDOMEN/PELVIS  Cervical mass measures approximately 4.8 x 4.9 cm with an SUV max of 12.8. Hypermetabolic lymph nodes are seen in the the left periaortic station with an index 12 mm short axis lymph node (CT image 123) having an SUV max of 13.4. Hypermetabolic adenopathy extends along the iliac chains bilaterally. Index right external iliac lymph node measures 11 mm (series 4, image 161) with an SUV max of 6.0. Index left external iliac lymph node measures 12 mm (image 167) with an SUV max of 6.6.  Minimal hypermetabolism in the left adrenal gland corresponds to a fluid density nodule, measuring 11 mm, indicative of an adenoma. No abnormal hypermetabolism in the liver, right adrenal gland, spleen or pancreas.  Cholelithiasis.  SKELETON  No abnormal osseous hypermetabolism.  IMPRESSION: 1. Hypermetabolic cervical mass with hypermetabolic abdominal and peritoneal retroperitoneal adenopathy, consistent with the given history of cervical carcinoma. 2. Hypermetabolic left supraclavicular and left axillary lymph  nodes. Left axillary lymph nodes are new from 08/21/2015, suggesting an infectious or inflammatory etiology. Attention on followup exams is warranted. 3. Mildly hypermetabolic lateral right breast nodule. Patient underwent mammography and ultrasound on 09/01/2015. 4. Coronary artery calcification. 5. Cholelithiasis.   Electronically Signed   By: Lorin Picket M.D.   On: 09/06/2015 12:40    EXAM:   [facility] DIGITAL DIAGNOSTIC BILATERAL MAMMOGRAM WITH 3D TOMOSYNTHESIS WITH CAD  ULTRASOUND RIGHT BREAST  COMPARISON: No prior mammogram or breast ultrasound. CT chest 08/21/2015 is correlated.  ACR Breast Density Category b: There are scattered areas of fibroglandular density.  FINDINGS: CC and MLO views of both breasts were obtained with tomosynthesis. There is an approximate 2.2 x 1.6 x 1.8 cm mass with irregular margins in the upper outer right breast, corresponding to the CT findings. A circumscribed oval-shaped low-density mass is identified in the upper right breast, anterior depth, measuring approximately 6 x 3 x 4 mm, immediately adjacent to or associated with a superficial vessel.  No findings suspicious for malignancy in the left breast.  Mammographic images were processed with CAD.  On physical exam, there is a deep palpable approximate 2 cm mass in the upper outer quadrant of the right breast corresponding to the mammographic finding.  Targeted right breast ultrasound is performed, showing a hypoechoic parallel oval mass with irregular margins, slight acoustic shadowing, demonstrating internal power Doppler flow, possibly associated with microcalcifications at the 10:30 o'clock position approximately 5 cm from the nipple measuring approximately 1.4 x 1.5 x 2.1 cm, corresponding with the mammographic finding. A sonographic correlate to the circumscribed mass in the upper right breast on the mammogram was not identified.  Sonographic evaluation of  the right axilla demonstrated no pathologic lymphadenopathy.  IMPRESSION: 1. Indeterminate 2.1 cm mass in the upper outer quadrant of the right breast at the 10:30 o'clock position approximately 5 cm from the nipple. This corresponds to the finding on the prior chest CT. 2. 6 mm benign-appearing mammographic mass in the upper right breast, anterior depth, without sonographic correlate. 3. No pathologic right axillary lymphadenopathy. 4. No mammographic evidence of malignancy, left breast.  RECOMMENDATION: 1. Ultrasound-guided core needle biopsy of the indeterminate right breast mass. 2. If the core needle biopsy demonstrates malignancy, MRI of both breasts would be recommended to see if there an MRI correlate to the mammographic finding  in the upper right breast, anterior depth. 3. If the core needle biopsy demonstrates a benign degenerating fibroadenoma, followup diagnostic right mammogram and right breast ultrasound would be recommended in 6 months. The ultrasound core needle biopsy procedure was discussed with the patient and her questions were answered. She has agreed to proceed and this has been scheduled for September 16 at 3 o'clock p.m.  I have discussed the findings and recommendations with the patient. Results were also provided in writing at the conclusion of the visit.  BI-RADS CATEGORY 4: Suspicious.   Medications: I have reviewed the patient's current medications. Prescription given for tramadol 50 mg q 8 hrs prn, in case tylenol not sufficient. Will continue Megace due to slight ongoing vaginal bleeding, prescription rewritten.   DISCUSSION:   Information from PET and breast imaging discussed with patient; multiple bilateral pulmonary nodules < 1 cm do not show on PET.  I have sent message to gyn oncology re PET. Patient understands that we need to clarify situation with breast biopsy, but obviously the most pressing concern is the cervical cancer, which by PET  is metastatic to paraaortic nodes and likely left supraclavicular. If the breast mass is a primary breast cancer, will need to coordinate care given rest of situation with metastatic cervical cancer.  We have discussed breast biopsy under US guidance. She understands that path will not be available until at least early next week from this biopsy.  She is progressively more symptomatic from severe iron deficiency anemia related to vaginal bleeding. Oral iron cannot replete iron stores quickly; we have discussed IV feraheme, particularly as chemotherapy (and radiation) may drop counts further. She is also symptomatic enough that She needs PRBCs, which hopefully we can do at [facility] on 09-11-15 (instead of CDDP originally planned that day). Will need to confirm treatment plan after breast biopsy information, but likely will be carbo taxol initially.  Patient is concerned about FMLA papers for her work, which she understands were faxed to [facility] financial office on 09-04-15. Message sent to financial staff.    Assessment/Plan: 1. 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 Bleeding better since radiation. Likely will treat with carbo taxol but awaiting breast biopsy information.  Prn tramadol. Continue Megace. 2.long and ongoing tobacco, tho not heavy smoker. Encouraged her to stop. Multiple bilateral pulmonary nodules likely too small for PET. 3.iron deficiency anemia with recent heavy vaginal bleeding: continue oral iron, but with further drop and symptoms will transfuse 2 units PRBCs + feraheme on 09-11-15.  4.right breast mass upper outer quadrant: mammograms :  No prior mammograms. If malignant, diagnostic and surgical procedures will need to take into account extensively metastatic cervical cancer. 5. Never colonoscopy 6.flu vaccine given  7.post carpal tunnel release 8.sclerotic rib lesion on CT no correlate on  PET 9.CAD by CT 10.cholelithiasis by CT 11.abnormal enhancement right renal collecting system by CT    All questions answered with information available thus far. She will need T&C on 9-19 prior to transfusion 2 units PRBCs that day, + feraheme if preauthorization obtained. I will follow up information from the breast biopsy. Note she lives in [place] but initially requested treatment in [place]. TIme spent 35 min including >50% counseling and coordination of care.    Gordy Levan, MD   09/07/2015, 8:31 PM

## 2015-09-08 ENCOUNTER — Other Ambulatory Visit: Payer: Self-pay | Admitting: Gynecologic Oncology

## 2015-09-08 ENCOUNTER — Ambulatory Visit (HOSPITAL_COMMUNITY)
Admission: RE | Admit: 2015-09-08 | Discharge: 2015-09-08 | Disposition: A | Discharge status: Home / Self Care | Payer: BLUE CROSS/BLUE SHIELD | Source: Ambulatory Visit | Attending: Oncology | Admitting: Oncology

## 2015-09-08 ENCOUNTER — Ambulatory Visit
Admission: RE | Admit: 2015-09-08 | Discharge: 2015-09-08 | Disposition: A | Discharge status: Home / Self Care | Payer: BLUE CROSS/BLUE SHIELD | Source: Ambulatory Visit | Attending: Gynecologic Oncology | Admitting: Gynecologic Oncology

## 2015-09-08 ENCOUNTER — Other Ambulatory Visit: Payer: Self-pay | Admitting: Oncology

## 2015-09-08 DIAGNOSIS — N6310 Unspecified lump in the right breast, unspecified quadrant: Secondary | ICD-10-CM

## 2015-09-08 DIAGNOSIS — D500 Iron deficiency anemia secondary to blood loss (chronic): Secondary | ICD-10-CM | POA: Insufficient documentation

## 2015-09-08 DIAGNOSIS — C539 Malignant neoplasm of cervix uteri, unspecified: Secondary | ICD-10-CM

## 2015-09-08 MED ORDER — SODIUM CHLORIDE 0.9 % IV SOLN
510.0000 mg | Freq: Once | INTRAVENOUS | Status: DC
  Filled 2015-09-08: qty 17

## 2015-09-11 ENCOUNTER — Encounter: Payer: Self-pay | Admitting: Oncology

## 2015-09-11 ENCOUNTER — Other Ambulatory Visit: Payer: Self-pay | Admitting: Oncology

## 2015-09-11 ENCOUNTER — Ambulatory Visit (HOSPITAL_BASED_OUTPATIENT_CLINIC_OR_DEPARTMENT_OTHER): Payer: BLUE CROSS/BLUE SHIELD

## 2015-09-11 ENCOUNTER — Other Ambulatory Visit: Payer: BLUE CROSS/BLUE SHIELD

## 2015-09-11 ENCOUNTER — Telehealth: Payer: Self-pay | Admitting: Oncology

## 2015-09-11 VITALS — BP 139/62 | HR 89 | Temp 98.2°F | Resp 18

## 2015-09-11 DIAGNOSIS — C539 Malignant neoplasm of cervix uteri, unspecified: Secondary | ICD-10-CM | POA: Diagnosis not present

## 2015-09-11 DIAGNOSIS — D500 Iron deficiency anemia secondary to blood loss (chronic): Secondary | ICD-10-CM

## 2015-09-11 LAB — ABO/RH: ABO/RH(D): O NEG

## 2015-09-11 LAB — PREPARE RBC (CROSSMATCH)

## 2015-09-11 MED ORDER — ACETAMINOPHEN 325 MG PO TABS
650.0000 mg | ORAL_TABLET | Freq: Once | ORAL | Status: AC
  Administered 2015-09-11: 650 mg via ORAL

## 2015-09-11 MED ORDER — ACETAMINOPHEN 325 MG PO TABS
ORAL_TABLET | ORAL | Status: AC
  Filled 2015-09-11: qty 2

## 2015-09-11 MED ORDER — SODIUM CHLORIDE 0.9 % IV SOLN
250.0000 mL | Freq: Once | INTRAVENOUS | Status: AC
  Administered 2015-09-11: 250 mL via INTRAVENOUS

## 2015-09-11 MED ORDER — SODIUM CHLORIDE 0.9 % IV SOLN
510.0000 mg | Freq: Once | INTRAVENOUS | Status: AC
  Administered 2015-09-11: 510 mg via INTRAVENOUS
  Filled 2015-09-11: qty 17

## 2015-09-11 NOTE — Progress Notes (Signed)
I placed fmla forms on desk of nurse for dr. Marko Plume

## 2015-09-11 NOTE — Telephone Encounter (Signed)
s.w. pt and advised on 9.22 appt....pt ok and aware...pt is also aware that appts will show up on print out in chemo

## 2015-09-11 NOTE — Progress Notes (Signed)
I left message for patient to call me to let me know when she went out of work. I will fax and mail her a copy of the form for her records. I will ck to see if she is still here

## 2015-09-11 NOTE — Patient Instructions (Addendum)
Blood Transfusion Information WHAT IS A BLOOD TRANSFUSION? A transfusion is the replacement of blood or some of its parts. Blood is made up of multiple cells which provide different functions.  Red blood cells carry oxygen and are used for blood loss replacement.  White blood cells fight against infection.  Platelets control bleeding.  Plasma helps clot blood.  Other blood products are available for specialized needs, such as hemophilia or other clotting disorders. BEFORE THE TRANSFUSION  Who gives blood for transfusions?   You may be able to donate blood to be used at a later date on yourself (autologous donation).  Relatives can be asked to donate blood. This is generally not any safer than if you have received blood from a stranger. The same precautions are taken to ensure safety when a relative's blood is donated.  Healthy volunteers who are fully evaluated to make sure their blood is safe. This is blood bank blood. Transfusion therapy is the safest it has ever been in the practice of medicine. Before blood is taken from a donor, a complete history is taken to make sure that person has no history of diseases nor engages in risky social behavior (examples are intravenous drug use or sexual activity with multiple partners). The donor's travel history is screened to minimize risk of transmitting infections, such as malaria. The donated blood is tested for signs of infectious diseases, such as HIV and hepatitis. The blood is then tested to be sure it is compatible with you in order to minimize the chance of a transfusion reaction. If you or a relative donates blood, this is often done in anticipation of surgery and is not appropriate for emergency situations. It takes many days to process the donated blood. RISKS AND COMPLICATIONS Although transfusion therapy is very safe and saves many lives, the main dangers of transfusion include:   Getting an infectious disease.  Developing a  transfusion reaction. This is an allergic reaction to something in the blood you were given. Every precaution is taken to prevent this. The decision to have a blood transfusion has been considered carefully by your caregiver before blood is given. Blood is not given unless the benefits outweigh the risks. AFTER THE TRANSFUSION  Right after receiving a blood transfusion, you will usually feel much better and more energetic. This is especially true if your red blood cells have gotten low (anemic). The transfusion raises the level of the red blood cells which carry oxygen, and this usually causes an energy increase.  The nurse administering the transfusion will monitor you carefully for complications. HOME CARE INSTRUCTIONS  No special instructions are needed after a transfusion. You may find your energy is better. Speak with your caregiver about any limitations on activity for underlying diseases you may have. SEEK MEDICAL CARE IF:   Your condition is not improving after your transfusion.  You develop redness or irritation at the intravenous (IV) site. SEEK IMMEDIATE MEDICAL CARE IF:  Any of the following symptoms occur over the next 12 hours:  Shaking chills.  You have a temperature by mouth above 102 F (38.9 C), not controlled by medicine.  Chest, back, or muscle pain.  People around you feel you are not acting correctly or are confused.  Shortness of breath or difficulty breathing.  Dizziness and fainting.  You get a rash or develop hives.  You have a decrease in urine output.  Your urine turns a dark color or changes to pink, red, or brown. Any of the following   symptoms occur over the next 10 days:  You have a temperature by mouth above 102 F (38.9 C), not controlled by medicine.  Shortness of breath.  Weakness after normal activity.  The white part of the eye turns yellow (jaundice).  You have a decrease in the amount of urine or are urinating less often.  Your  urine turns a dark color or changes to pink, red, or brown. Document Released: 12/06/2000 Document Revised: 03/02/2012 Document Reviewed: 07/25/2008 ExitCare Patient Information 2015 [employer]. This information is not intended to replace advice given to you by your health care provider. Make sure you discuss any questions you have with your health care provider.  Ferumoxytol injection What is this medicine? FERUMOXYTOL is an iron complex. Iron is used to make healthy red blood cells, which carry oxygen and nutrients throughout the body. This medicine is used to treat iron deficiency anemia in people with chronic kidney disease. This medicine may be used for other purposes; ask your health care provider or pharmacist if you have questions. COMMON BRAND NAME(S): Feraheme What should I tell my health care provider before I take this medicine? They need to know if you have any of these conditions: -anemia not caused by low iron levels -high levels of iron in the blood -magnetic resonance imaging (MRI) test scheduled -an unusual or allergic reaction to iron, other medicines, foods, dyes, or preservatives -pregnant or trying to get pregnant -breast-feeding How should I use this medicine? This medicine is for injection into a vein. It is given by a health care professional in a hospital or clinic setting. Talk to your pediatrician regarding the use of this medicine in children. Special care may be needed. Overdosage: If you think you've taken too much of this medicine contact a poison control center or emergency room at once. Overdosage: If you think you have taken too much of this medicine contact a poison control center or emergency room at once. NOTE: This medicine is only for you. Do not share this medicine with others. What if I miss a dose? It is important not to miss your dose. Call your doctor or health care professional if you are unable to keep an appointment. What may interact with  this medicine? This medicine may interact with the following medications: -other iron products This list may not describe all possible interactions. Give your health care provider a list of all the medicines, herbs, non-prescription drugs, or dietary supplements you use. Also tell them if you smoke, drink alcohol, or use illegal drugs. Some items may interact with your medicine. What should I watch for while using this medicine? Visit your doctor or healthcare professional regularly. Tell your doctor or healthcare professional if your symptoms do not start to get better or if they get worse. You may need blood work done while you are taking this medicine. You may need to follow a special diet. Talk to your doctor. Foods that contain iron include: whole grains/cereals, dried fruits, beans, or peas, leafy green vegetables, and organ meats (liver, kidney). What side effects may I notice from receiving this medicine? Side effects that you should report to your doctor or health care professional as soon as possible: -allergic reactions like skin rash, itching or hives, swelling of the face, lips, or tongue -breathing problems -changes in blood pressure -feeling faint or lightheaded, falls -fever or chills -flushing, sweating, or hot feelings -swelling of the ankles or feet Side effects that usually do not require medical attention (Report these   to your doctor or health care professional if they continue or are bothersome.): -diarrhea -headache -nausea, vomiting -stomach pain This list may not describe all possible side effects. Call your doctor for medical advice about side effects. You may report side effects to FDA at 1-800-FDA-1088. Where should I keep my medicine? This drug is given in a hospital or clinic and will not be stored at home. NOTE: This sheet is a summary. It may not cover all possible information. If you have questions about this medicine, talk to your doctor, pharmacist, or  health care provider.  2015, Elsevier/Gold Standard. (2012-07-24 15:23:36)   

## 2015-09-12 LAB — TYPE AND SCREEN
ABO/RH(D): O NEG
ANTIBODY SCREEN: NEGATIVE
UNIT DIVISION: 0
UNIT DIVISION: 0

## 2015-09-13 ENCOUNTER — Other Ambulatory Visit: Payer: Self-pay | Admitting: Oncology

## 2015-09-13 DIAGNOSIS — C539 Malignant neoplasm of cervix uteri, unspecified: Secondary | ICD-10-CM

## 2015-09-14 ENCOUNTER — Telehealth: Payer: Self-pay | Admitting: Oncology

## 2015-09-14 ENCOUNTER — Ambulatory Visit (HOSPITAL_BASED_OUTPATIENT_CLINIC_OR_DEPARTMENT_OTHER): Payer: BLUE CROSS/BLUE SHIELD | Admitting: Oncology

## 2015-09-14 ENCOUNTER — Other Ambulatory Visit (HOSPITAL_BASED_OUTPATIENT_CLINIC_OR_DEPARTMENT_OTHER): Payer: BLUE CROSS/BLUE SHIELD

## 2015-09-14 ENCOUNTER — Encounter: Payer: Self-pay | Admitting: Oncology

## 2015-09-14 VITALS — BP 152/68 | HR 83 | Temp 98.1°F | Resp 18 | Ht 64.0 in | Wt 133.8 lb

## 2015-09-14 DIAGNOSIS — C539 Malignant neoplasm of cervix uteri, unspecified: Secondary | ICD-10-CM

## 2015-09-14 DIAGNOSIS — N63 Unspecified lump in breast: Secondary | ICD-10-CM

## 2015-09-14 DIAGNOSIS — D500 Iron deficiency anemia secondary to blood loss (chronic): Secondary | ICD-10-CM | POA: Diagnosis not present

## 2015-09-14 DIAGNOSIS — N6310 Unspecified lump in the right breast, unspecified quadrant: Secondary | ICD-10-CM

## 2015-09-14 DIAGNOSIS — R918 Other nonspecific abnormal finding of lung field: Secondary | ICD-10-CM

## 2015-09-14 DIAGNOSIS — Z720 Tobacco use: Secondary | ICD-10-CM | POA: Diagnosis not present

## 2015-09-14 LAB — COMPREHENSIVE METABOLIC PANEL (CC13)
ALBUMIN: 3.2 g/dL — AB (ref 3.5–5.0)
ALK PHOS: 92 U/L (ref 40–150)
ALT: 37 U/L (ref 0–55)
ANION GAP: 7 meq/L (ref 3–11)
AST: 19 U/L (ref 5–34)
BUN: 12.1 mg/dL (ref 7.0–26.0)
CALCIUM: 9.5 mg/dL (ref 8.4–10.4)
CO2: 23 mEq/L (ref 22–29)
Chloride: 108 mEq/L (ref 98–109)
Creatinine: 0.7 mg/dL (ref 0.6–1.1)
GLUCOSE: 75 mg/dL (ref 70–140)
POTASSIUM: 4.5 meq/L (ref 3.5–5.1)
SODIUM: 138 meq/L (ref 136–145)
TOTAL PROTEIN: 7.8 g/dL (ref 6.4–8.3)

## 2015-09-14 LAB — CBC WITH DIFFERENTIAL/PLATELET
BASO%: 0.3 % (ref 0.0–2.0)
BASOS ABS: 0 10*3/uL (ref 0.0–0.1)
EOS ABS: 0.2 10*3/uL (ref 0.0–0.5)
EOS%: 2.3 % (ref 0.0–7.0)
HEMATOCRIT: 35.2 % (ref 34.8–46.6)
HEMOGLOBIN: 11.2 g/dL — AB (ref 11.6–15.9)
LYMPH%: 16.9 % (ref 14.0–49.7)
MCH: 27.3 pg (ref 25.1–34.0)
MCHC: 31.9 g/dL (ref 31.5–36.0)
MCV: 85.6 fL (ref 79.5–101.0)
MONO#: 0.7 10*3/uL (ref 0.1–0.9)
MONO%: 11.2 % (ref 0.0–14.0)
NEUT#: 4.6 10*3/uL (ref 1.5–6.5)
NEUT%: 69.3 % (ref 38.4–76.8)
Platelets: 405 10*3/uL — ABNORMAL HIGH (ref 145–400)
RBC: 4.12 10*6/uL (ref 3.70–5.45)
RDW: 21.5 % — AB (ref 11.2–14.5)
WBC: 6.6 10*3/uL (ref 3.9–10.3)
lymph#: 1.1 10*3/uL (ref 0.9–3.3)

## 2015-09-14 NOTE — Progress Notes (Signed)
OFFICE PROGRESS NOTE   September 14, 2015   [place], [place], Angelyn Punt, MD, Gery Pray  INTERVAL HISTORY:  Patient is seen, alone for visit, in continuing attention to metastatic cervical cancer and other concerns. She had 2 units PRBCs and first dose feraheme on 09-11-15, tolerated without difficulty. She had right breast biopsy 09-08-15, path with benign fibroadenoma, and recommendation for repeat diagnostic mammogram and [place] on right in 6 months.  Dr Alycia Rossetti and I have discussed PET information, with recommendations for carboplatin taxol, possibly also with avastin,  rather than definitive radiation with sensitizing CDDP as had been planned prior to PET.   Patient is having only occasional brownish vaginal drainage now, no frank bleeding since urgent radiation on 09-04-15. She does not have much pelvic discomfort. Bowels are moving and bladder function unchanged. She has no other bleeding. She denies LE swelling.  She has decreased cigarettes, now ~ 1/2 PPD, discussed, goal of stopping entirely. She prefers not to use nicotine patch. She denies increased SOB or chest pain. She has not been uncomfortable from the breast biopsy.   No PAC, however peripheral IV access not easy and she may need PAC, would need to adjust start of avastin if PAC needed. Flu vaccine done 09-01-15  ONCOLOGIC HISTORY Patient has had no regular medical care in years, including no PAP in >15 years. She had no bleeding since menopause until several hours of heavy vaginal bleeding in 04-2015, which resolved other than intermittent spotting until extremely heavy vaginal bleeding again 08-08-15. She was seen in [facility] ED then, with Hgb 8.0; she was given premarin and continued on oral megace x 2 weeks. She was seen by Dr Mallory Shirk on 08-16-15 , with finding of large fungating cervical mass with parametrial involvement. Cervical biopsy 08-17-15 ([id number]) showed moderately to poorly differentiated squamous  cell carcinoma. She had CT CAP in [facility] system on 08-21-15, with 1.7 cm oval mass upper outer right breast, multiple prominent mediastinal lymph nodes and multiiple bilateral pulmonary nodules including a 2.7 x 1.8 cm nodule in RLL., retroperitoneal and external iliac adenopathy, 4.2 x 5.2 x 6.1 cm mass involving cervix and lower uterine segment, 1 cm sclerotic lesion in lateral left fifth rib, liver ok and no ureteral obstruction. She saw Dr Alycia Rossetti on 08-23-15, who agreed with diagnosis of cervical cancer at least IIIB, possibly IV if pulmonary mets. PET pending, recommendation if stage IV to use short course RT to control bleeding then carbo taxol +/- avastin; if PET shows pelvis only then radiation with sensitizing CDDP. Patient saw Dr Sondra Come in consultation on 08-30-15, with vaginal packing required for heavy bleeding. She had urgent radiation thru 09-04-15. PET 09-06-15 had uptake in cervical mass, periaortic and iliac nodes, left supraclavicular node, left subpectoral and axillary nodes, and in right breast mass. Biposy of right breast 09-08-15 benign.     Review of systems as above, also: No fever or symptoms of infection. No nausea. Has felt stronger since PRBCs and iron.  Remainder of 10 point Review of Systems negative.  Husband cam with her to office today but preferred to wait in lobby. I have offered to speak with him by phone if she would like, as he has not been comfortable coming with her to exam area.  Objective:  Vital signs in last 24 hours:  BP 152/68 mmHg  Pulse 83  Temp(Src) 98.1 F (36.7 C) (Oral)  Resp 18  Ht 5' 4"  (1.626 m)  Wt 133 lb 12.8  oz (60.691 kg)  BMI 22.96 kg/m2  SpO2 100%  LMP 08/25/2015 Weight stable Alert, oriented and appropriate. Ambulatory without difficulty. Respirations not labored RA No alopecia  HEENT:PERRL, sclerae not icteric. Oral mucosa moist without lesions, posterior pharynx clear.  Neck supple. No JVD.  Lymphatics:no  cervical,supraclavicular, axillary or inguinal adenopathy Resp: diminished BS thruout without wheezes or rales, no dullness to percussion bilaterally Cardio: regular rate and rhythm. No gallop. GI: soft, nontender, not distended, no mass or organomegaly. Normally active bowel sounds.  Musculoskeletal/ Extremities: without pitting edema, cords, tenderness Neuro: no peripheral neuropathy. Otherwise nonfocal. PSYCH appropriate mood and affect Skin without rash, ecchymosis, petechiae Breasts: RIght breast with minimal bruising at site of core biopsy, no change in palpable mass at that site, left breast without dominant mass, skin or nipple findings. Axillae benign.   Lab Results:  Results for orders placed or performed in visit on 09/14/15  CBC with Differential  Result Value Ref Range   WBC 6.6 3.9 - 10.3 10e3/uL   NEUT# 4.6 1.5 - 6.5 10e3/uL   HGB 11.2 (L) 11.6 - 15.9 g/dL   HCT 35.2 34.8 - 46.6 %   Platelets 405 (H) 145 - 400 10e3/uL   MCV 85.6 79.5 - 101.0 fL   MCH 27.3 25.1 - 34.0 pg   MCHC 31.9 31.5 - 36.0 g/dL   RBC 4.12 3.70 - 5.45 10e6/uL   RDW 21.5 (H) 11.2 - 14.5 %   lymph# 1.1 0.9 - 3.3 10e3/uL   MONO# 0.7 0.1 - 0.9 10e3/uL   Eosinophils Absolute 0.2 0.0 - 0.5 10e3/uL   Basophils Absolute 0.0 0.0 - 0.1 10e3/uL   NEUT% 69.3 38.4 - 76.8 %   LYMPH% 16.9 14.0 - 49.7 %   MONO% 11.2 0.0 - 14.0 %   EOS% 2.3 0.0 - 7.0 %   BASO% 0.3 0.0 - 2.0 %  Comprehensive metabolic panel (Cmet) - [facility]  Result Value Ref Range   Sodium 138 136 - 145 mEq/L   Potassium 4.5 3.5 - 5.1 mEq/L   Chloride 108 98 - 109 mEq/L   CO2 23 22 - 29 mEq/L   Glucose 75 70 - 140 mg/dl   BUN 12.1 7.0 - 26.0 mg/dL   Creatinine 0.7 0.6 - 1.1 mg/dL   Total Bilirubin <0.30 0.20 - 1.20 mg/dL   Alkaline Phosphatase 92 40 - 150 U/L   AST 19 5 - 34 U/L   ALT 37 0 - 55 U/L   Total Protein 7.8 6.4 - 8.3 g/dL   Albumin 3.2 (L) 3.5 - 5.0 g/dL   Calcium 9.5 8.4 - 10.4 mg/dL   Anion Gap 7 3 - 11 mEq/L   EGFR >90  >90 ml/min/1.73 m2     Studies/Results: Pathology right breast biopsy [id number] Received: 09/08/2015 Ammie Ferrier, MD DOB: August 30, 1956 Age: 59 Gender: F Reported: 09/11/2015 [place] REPORT OF SURGICAL PATHOLOGY FINAL DIAGNOSISDiagnosis Breast, right, needle core biopsy, upper outer quadrant at 10:30 o'clock - FIBROADENOMA WITH CALCIFICATIONS. - THERE IS NO EVIDENCE OF MALIGNANCY.   Medications: I have reviewed the patient's current medications. With good improvement now in hemoglobin post transfusion and feraheme x1, will try on oral iron as Hemocyte or ferrous fumarate now. Decadron 20 mg with food 12 hrs and 6 hrs prior to taxol. Lorazepam and zofran previously prescribed.  DISCUSSION: I have explained to patient that systemic chemotherapy with taxol and carboplatin + possible avastin is best intervention for metastatic cervical cancer, and will be in  attempt to improve and control the disease, but is not expected to be curative. She probably can have additional radiation if further bleeding is problematic. We have reviewed carboplatin and taxol, which was included in her initial chemotherapy education information, including premedication decadron 20 mg 12 hrs and 6 hrs prior each dose with food, possible taxol aches and possible peripheral neuropathy with taxol. She will have first long taxol carbo on 09-21-15 if preauthorization obtained, then I will see her ~ 1 week and 2 weeks after with labs. I did offer treatment at [facility] with Dr Whitney Muse, however she prefers to come to [place] now (she is originally from [place], works in [place] and family in [place]).  We have discussed PAC, which she was not aware of prior to this conversation. She would like to try peripheral access initially, however I will ask infusion RN to do [facility] teaching also.  Smoking cessation discussed and encouraged.   Verbal consent for chemo obtained. She has not had avastin  teaching.  Assessment/Plan: 1. 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 Bleeding essentially resolved since radiation. Begin treatment with carbo taxol, likely will add avastin but will discuss further and coordinate PAC first if needed.  Prn tramadol. May be able to stop Megace soon. 2.long and ongoing tobacco, which she is now actively tryiing to stop. Multiple bilateral pulmonary nodules likely too small for PET to clarify.  3.iron deficiency anemia with recent heavy vaginal bleeding: Transfused 2 units PRBCs + 573m feraheme on 09-11-15. Will change oral iron to Hemocyte/ ferrous fumarate and follow. 4.right breast mass upper outer quadrant: biopsy with benign findings, 6 month follow up diagnostic mammogram + [place]commended  5. Never colonoscopy 6.flu vaccine given 09-01-15 7.post carpal tunnel release 8.sclerotic rib lesion on CT no correlate on PET 9.CAD by CT 10.cholelithiasis by CT 11.abnormal enhancement right renal collecting system by CT 12.peripheral veins not easy, may need PAC, best to do after cycle 1 if so, as will need to hold avastin for that placement. 13.[place] [facility] She understands that medical oncology care could also transfer there if she prefers.   All questions answered and patient is in agreement with recommendations and plans as noted. Chemo orders placed, notification sent for preauthorization carbo taxol and neulasta. Cc Drs GRogelio Seen KCrawford Givens Time spent 30 min including >50% counseling and coordination of care.    LGordy Levan MD   09/14/2015, 6:26 PM

## 2015-09-14 NOTE — Telephone Encounter (Signed)
appointemnts made and avs printed for patient

## 2015-09-14 NOTE — Patient Instructions (Signed)
Zofran/ ondansetron   8 mg every 8 hrs as needed for nausea, will NOT make drowsy  Ativan/ lorazepam   0.5 mg swallow or dissolve under tongue every 6 hrs as needed for nausea  WILL make you drowsy  Decadron (dexamethasone) 4 mg:   Five tablets with food 12 hrs before taxol chemotherapy and five tablets with food 6 hrs before taxol chemotherapy  Try claritin (lortadine) 10 mg daily beginning day of chemo for 4-5 days, as this may help taxol aches

## 2015-09-15 MED ORDER — HEMOCYTE-PLUS 106-1 MG PO TABS
1.0000 | ORAL_TABLET | Freq: Every day | ORAL | Status: DC
Start: 2015-09-15 — End: 2015-09-28

## 2015-09-15 MED ORDER — DEXAMETHASONE 4 MG PO TABS: ORAL_TABLET | ORAL | Status: DC

## 2015-09-18 ENCOUNTER — Encounter: Payer: Self-pay | Admitting: Oncology

## 2015-09-18 NOTE — Progress Notes (Signed)
I faxed the [employer] form  [phone]

## 2015-09-20 ENCOUNTER — Telehealth: Payer: Self-pay | Admitting: *Deleted

## 2015-09-20 DIAGNOSIS — C539 Malignant neoplasm of cervix uteri, unspecified: Secondary | ICD-10-CM

## 2015-09-20 NOTE — Telephone Encounter (Signed)
-----   Message from Patton Salles, RN sent at 09/19/2015  3:48 PM EDT -----   ----- Message -----    From: Gordy Levan, MD    Sent: 09/16/2015   1:20 PM      To: Patton Salles, RN  For first [place] taxol 9-29 if authorization happens. Please call her ~ 9-28 to review times for decadron five tabs = 20 mg with food 12 hrs and 6 hrs before taxol  thanks

## 2015-09-20 NOTE — Telephone Encounter (Signed)
This RN spoke with Valerie Figueroa in managed care- verified treatment has been authorized for 09/22/2015.  This RN then called pt and discussed above as well as use of decadron premed-  Pt wrote down times to take medication for therapy at 12 and 6 hours prior.  Verified with pharmacy medications are available and ready for pick up.

## 2015-09-22 ENCOUNTER — Ambulatory Visit (HOSPITAL_BASED_OUTPATIENT_CLINIC_OR_DEPARTMENT_OTHER): Payer: BLUE CROSS/BLUE SHIELD | Admitting: Nurse Practitioner

## 2015-09-22 ENCOUNTER — Telehealth: Payer: Self-pay | Admitting: Oncology

## 2015-09-22 ENCOUNTER — Other Ambulatory Visit: Payer: Self-pay | Admitting: Oncology

## 2015-09-22 ENCOUNTER — Other Ambulatory Visit: Payer: Self-pay | Admitting: *Deleted

## 2015-09-22 ENCOUNTER — Ambulatory Visit (HOSPITAL_BASED_OUTPATIENT_CLINIC_OR_DEPARTMENT_OTHER): Payer: BLUE CROSS/BLUE SHIELD

## 2015-09-22 ENCOUNTER — Other Ambulatory Visit (HOSPITAL_BASED_OUTPATIENT_CLINIC_OR_DEPARTMENT_OTHER): Payer: BLUE CROSS/BLUE SHIELD

## 2015-09-22 VITALS — BP 148/58 | HR 108 | Temp 97.9°F | Resp 20

## 2015-09-22 DIAGNOSIS — C539 Malignant neoplasm of cervix uteri, unspecified: Secondary | ICD-10-CM

## 2015-09-22 DIAGNOSIS — R0600 Dyspnea, unspecified: Secondary | ICD-10-CM | POA: Diagnosis not present

## 2015-09-22 DIAGNOSIS — Z5111 Encounter for antineoplastic chemotherapy: Secondary | ICD-10-CM

## 2015-09-22 DIAGNOSIS — T7840XA Allergy, unspecified, initial encounter: Secondary | ICD-10-CM

## 2015-09-22 LAB — CBC WITH DIFFERENTIAL/PLATELET
BASO%: 0.2 % (ref 0.0–2.0)
Basophils Absolute: 0 10*3/uL (ref 0.0–0.1)
EOS%: 0 % (ref 0.0–7.0)
Eosinophils Absolute: 0 10*3/uL (ref 0.0–0.5)
HCT: 36.9 % (ref 34.8–46.6)
HGB: 12 g/dL (ref 11.6–15.9)
LYMPH%: 7.1 % — AB (ref 14.0–49.7)
MCH: 27.8 pg (ref 25.1–34.0)
MCHC: 32.5 g/dL (ref 31.5–36.0)
MCV: 85.4 fL (ref 79.5–101.0)
MONO#: 0 10*3/uL — ABNORMAL LOW (ref 0.1–0.9)
MONO%: 0.7 % (ref 0.0–14.0)
NEUT%: 92 % — ABNORMAL HIGH (ref 38.4–76.8)
NEUTROS ABS: 6 10*3/uL (ref 1.5–6.5)
PLATELETS: 365 10*3/uL (ref 145–400)
RBC: 4.32 10*6/uL (ref 3.70–5.45)
RDW: 20.3 % — ABNORMAL HIGH (ref 11.2–14.5)
WBC: 6.5 10*3/uL (ref 3.9–10.3)
lymph#: 0.5 10*3/uL — ABNORMAL LOW (ref 0.9–3.3)

## 2015-09-22 LAB — COMPREHENSIVE METABOLIC PANEL (CC13)
ALT: 40 U/L (ref 0–55)
ANION GAP: 12 meq/L — AB (ref 3–11)
AST: 19 U/L (ref 5–34)
Albumin: 3.7 g/dL (ref 3.5–5.0)
Alkaline Phosphatase: 92 U/L (ref 40–150)
BUN: 12.7 mg/dL (ref 7.0–26.0)
CHLORIDE: 105 meq/L (ref 98–109)
CO2: 17 meq/L — AB (ref 22–29)
CREATININE: 0.8 mg/dL (ref 0.6–1.1)
Calcium: 9.9 mg/dL (ref 8.4–10.4)
EGFR: 82 mL/min/{1.73_m2} — ABNORMAL LOW (ref >90)
GLUCOSE: 153 mg/dL — AB (ref 70–140)
Potassium: 4.2 mEq/L (ref 3.5–5.1)
SODIUM: 134 meq/L — AB (ref 136–145)
TOTAL PROTEIN: 8.3 g/dL (ref 6.4–8.3)

## 2015-09-22 MED ORDER — METHYLPREDNISOLONE SODIUM SUCC 125 MG IJ SOLR
125.0000 mg | Freq: Once | INTRAMUSCULAR | Status: AC | PRN
  Administered 2015-09-22: 125 mg via INTRAVENOUS

## 2015-09-22 MED ORDER — SODIUM CHLORIDE 0.9 % IV SOLN
479.0000 mg | Freq: Once | INTRAVENOUS | Status: DC
  Filled 2015-09-22: qty 48

## 2015-09-22 MED ORDER — FAMOTIDINE IN NACL 20-0.9 MG/50ML-% IV SOLN
INTRAVENOUS | Status: AC
  Filled 2015-09-22: qty 50

## 2015-09-22 MED ORDER — SODIUM CHLORIDE 0.9 % IV SOLN
Freq: Once | INTRAVENOUS | Status: AC
  Administered 2015-09-22: 12:00:00 via INTRAVENOUS
  Filled 2015-09-22: qty 8

## 2015-09-22 MED ORDER — FAMOTIDINE IN NACL 20-0.9 MG/50ML-% IV SOLN
20.0000 mg | Freq: Once | INTRAVENOUS | Status: AC
  Administered 2015-09-22: 20 mg via INTRAVENOUS

## 2015-09-22 MED ORDER — SODIUM CHLORIDE 0.9 % IV SOLN
Freq: Once | INTRAVENOUS | Status: AC
  Administered 2015-09-22: 12:00:00 via INTRAVENOUS

## 2015-09-22 MED ORDER — ALBUTEROL SULFATE (2.5 MG/3ML) 0.083% IN NEBU
2.5000 mg | INHALATION_SOLUTION | Freq: Once | RESPIRATORY_TRACT | Status: AC
  Administered 2015-09-22: 2.5 mg via RESPIRATORY_TRACT
  Filled 2015-09-22: qty 3

## 2015-09-22 MED ORDER — PACLITAXEL CHEMO INJECTION 300 MG/50ML
175.0000 mg/m2 | Freq: Once | INTRAVENOUS | Status: AC
  Administered 2015-09-22: 288 mg via INTRAVENOUS
  Filled 2015-09-22: qty 48

## 2015-09-22 MED ORDER — DIPHENHYDRAMINE HCL 50 MG/ML IJ SOLN
INTRAMUSCULAR | Status: AC
  Filled 2015-09-22: qty 1

## 2015-09-22 MED ORDER — DIPHENHYDRAMINE HCL 50 MG/ML IJ SOLN
50.0000 mg | Freq: Once | INTRAMUSCULAR | Status: AC
  Administered 2015-09-22: 50 mg via INTRAVENOUS

## 2015-09-22 MED ORDER — ALBUTEROL SULFATE HFA 108 (90 BASE) MCG/ACT IN AERS: 2.0000 | INHALATION_SPRAY | Freq: Four times a day (QID) | RESPIRATORY_TRACT | Status: DC | PRN

## 2015-09-22 NOTE — Telephone Encounter (Signed)
Called and left a message with 10/6 appointments and patient will get a new avs today

## 2015-09-22 NOTE — Progress Notes (Signed)
1251-Pt only received approx 1 mL of taxol; pt sat up and started coughing, and voiced "I can't breath, my throat feels like it is closing up".  Pt visibly red. Taxol infusion stopped; Amy Horton, Charge RN and Selena Lesser NP to chairside for assistance; see VS flow sheet for BP values.  Solu-Medrol given per protocol and Cyndee Bacon in agreement.  Albuterol inhaler ordered as well.  1307-BP stable, pt visibly resting comfortably, no redness or coughing. Pt denies shortness of breath at this time.  1430-Per Selena Lesser; after speaking with Dr. Marko Plume, hold all chemo today. Pt discharged ambulatory in no acute distress, and knows when to call us if any questions or concerns arise. Knows to obtain inhaler called into her pharmacy. Has her appts for 10/3.

## 2015-09-22 NOTE — Patient Instructions (Addendum)
[facility] Discharge Instructions for Patients Receiving Chemotherapy  Today you received the following chemotherapy agents: Taxol, Carboplatin  To help prevent nausea and vomiting after your treatment, we encourage you to take your nausea medication as prescribed by your physician. TAKE YOUR ZOFRAN OR ATIVAN TONIGHT AT BEDTIME EVEN IF NAUSEA IS NOT PRESENT.   If you develop nausea and vomiting that is not controlled by your nausea medication, call the clinic.   BELOW ARE SYMPTOMS THAT SHOULD BE REPORTED IMMEDIATELY:  *FEVER GREATER THAN 100.5 F  *CHILLS WITH OR WITHOUT FEVER  NAUSEA AND VOMITING THAT IS NOT CONTROLLED WITH YOUR NAUSEA MEDICATION  *UNUSUAL SHORTNESS OF BREATH  *UNUSUAL BRUISING OR BLEEDING  TENDERNESS IN MOUTH AND THROAT WITH OR WITHOUT PRESENCE OF ULCERS  *URINARY PROBLEMS  *BOWEL PROBLEMS  UNUSUAL RASH Items with * indicate a potential emergency and should be followed up as soon as possible.  Feel free to call the clinic you have any questions or concerns. The clinic phone number is [phone].  Please show the [facility] at check-in to the Emergency Department and triage nurse.  Paclitaxel injection What is this medicine? PACLITAXEL (PAK li TAX el) is a chemotherapy drug. It targets fast dividing cells, like cancer cells, and causes these cells to die. This medicine is used to treat ovarian cancer, breast cancer, and other cancers. This medicine may be used for other purposes; ask your health care provider or pharmacist if you have questions. COMMON BRAND NAME(S): Onxol, Taxol What should I tell my health care provider before I take this medicine? They need to know if you have any of these conditions: -blood disorders -irregular heartbeat -infection (especially a virus infection such as chickenpox, cold sores, or herpes) -liver disease -previous or ongoing radiation therapy -an unusual or allergic reaction to paclitaxel,  alcohol, polyoxyethylated castor oil, other chemotherapy agents, other medicines, foods, dyes, or preservatives -pregnant or trying to get pregnant -breast-feeding How should I use this medicine? This drug is given as an infusion into a vein. It is administered in a hospital or clinic by a specially trained health care professional. Talk to your pediatrician regarding the use of this medicine in children. Special care may be needed. Overdosage: If you think you have taken too much of this medicine contact a poison control center or emergency room at once. NOTE: This medicine is only for you. Do not share this medicine with others. What if I miss a dose? It is important not to miss your dose. Call your doctor or health care professional if you are unable to keep an appointment. What may interact with this medicine? Do not take this medicine with any of the following medications: -disulfiram -metronidazole This medicine may also interact with the following medications: -cyclosporine -diazepam -ketoconazole -medicines to increase blood counts like filgrastim, pegfilgrastim, sargramostim -other chemotherapy drugs like cisplatin, doxorubicin, epirubicin, etoposide, teniposide, vincristine -quinidine -testosterone -vaccines -verapamil Talk to your doctor or health care professional before taking any of these medicines: -acetaminophen -aspirin -ibuprofen -ketoprofen -naproxen This list may not describe all possible interactions. Give your health care provider a list of all the medicines, herbs, non-prescription drugs, or dietary supplements you use. Also tell them if you smoke, drink alcohol, or use illegal drugs. Some items may interact with your medicine. What should I watch for while using this medicine? Your condition will be monitored carefully while you are receiving this medicine. You will need important blood work done while you are taking this  medicine. This drug may make you feel  generally unwell. This is not uncommon, as chemotherapy can affect healthy cells as well as cancer cells. Report any side effects. Continue your course of treatment even though you feel ill unless your doctor tells you to stop. In some cases, you may be given additional medicines to help with side effects. Follow all directions for their use. Call your doctor or health care professional for advice if you get a fever, chills or sore throat, or other symptoms of a cold or flu. Do not treat yourself. This drug decreases your body's ability to fight infections. Try to avoid being around people who are sick. This medicine may increase your risk to bruise or bleed. Call your doctor or health care professional if you notice any unusual bleeding. Be careful brushing and flossing your teeth or using a toothpick because you may get an infection or bleed more easily. If you have any dental work done, tell your dentist you are receiving this medicine. Avoid taking products that contain aspirin, acetaminophen, ibuprofen, naproxen, or ketoprofen unless instructed by your doctor. These medicines may hide a fever. Do not become pregnant while taking this medicine. Women should inform their doctor if they wish to become pregnant or think they might be pregnant. There is a potential for serious side effects to an unborn child. Talk to your health care professional or pharmacist for more information. Do not breast-feed an infant while taking this medicine. Men are advised not to father a child while receiving this medicine. What side effects may I notice from receiving this medicine? Side effects that you should report to your doctor or health care professional as soon as possible: -allergic reactions like skin rash, itching or hives, swelling of the face, lips, or tongue -low blood counts - This drug may decrease the number of white blood cells, red blood cells and platelets. You may be at increased risk for infections and  bleeding. -signs of infection - fever or chills, cough, sore throat, pain or difficulty passing urine -signs of decreased platelets or bleeding - bruising, pinpoint red spots on the skin, black, tarry stools, nosebleeds -signs of decreased red blood cells - unusually weak or tired, fainting spells, lightheadedness -breathing problems -chest pain -high or low blood pressure -mouth sores -nausea and vomiting -pain, swelling, redness or irritation at the injection site -pain, tingling, numbness in the hands or feet -slow or irregular heartbeat -swelling of the ankle, feet, hands Side effects that usually do not require medical attention (report to your doctor or health care professional if they continue or are bothersome): -bone pain -complete hair loss including hair on your head, underarms, pubic hair, eyebrows, and eyelashes -changes in the color of fingernails -diarrhea -loosening of the fingernails -loss of appetite -muscle or joint pain -red flush to skin -sweating This list may not describe all possible side effects. Call your doctor for medical advice about side effects. You may report side effects to FDA at 1-800-FDA-1088. Where should I keep my medicine? This drug is given in a hospital or clinic and will not be stored at home. NOTE: This sheet is a summary. It may not cover all possible information. If you have questions about this medicine, talk to your doctor, pharmacist, or health care provider.  2015, [employer]. (2013-02-01 16:41:21)  Carboplatin injection What is this medicine? CARBOPLATIN (KAR boe pla tin) is a chemotherapy drug. It targets fast dividing cells, like cancer cells, and causes these cells to die.  This medicine is used to treat ovarian cancer and many other cancers. This medicine may be used for other purposes; ask your health care provider or pharmacist if you have questions. COMMON BRAND NAME(S): Paraplatin What should I tell my health care  provider before I take this medicine? They need to know if you have any of these conditions: -blood disorders -hearing problems -kidney disease -recent or ongoing radiation therapy -an unusual or allergic reaction to carboplatin, cisplatin, other chemotherapy, other medicines, foods, dyes, or preservatives -pregnant or trying to get pregnant -breast-feeding How should I use this medicine? This drug is usually given as an infusion into a vein. It is administered in a hospital or clinic by a specially trained health care professional. Talk to your pediatrician regarding the use of this medicine in children. Special care may be needed. Overdosage: If you think you have taken too much of this medicine contact a poison control center or emergency room at once. NOTE: This medicine is only for you. Do not share this medicine with others. What if I miss a dose? It is important not to miss a dose. Call your doctor or health care professional if you are unable to keep an appointment. What may interact with this medicine? -medicines for seizures -medicines to increase blood counts like filgrastim, pegfilgrastim, sargramostim -some antibiotics like amikacin, gentamicin, neomycin, streptomycin, tobramycin -vaccines Talk to your doctor or health care professional before taking any of these medicines: -acetaminophen -aspirin -ibuprofen -ketoprofen -naproxen This list may not describe all possible interactions. Give your health care provider a list of all the medicines, herbs, non-prescription drugs, or dietary supplements you use. Also tell them if you smoke, drink alcohol, or use illegal drugs. Some items may interact with your medicine. What should I watch for while using this medicine? Your condition will be monitored carefully while you are receiving this medicine. You will need important blood work done while you are taking this medicine. This drug may make you feel generally unwell. This is not  uncommon, as chemotherapy can affect healthy cells as well as cancer cells. Report any side effects. Continue your course of treatment even though you feel ill unless your doctor tells you to stop. In some cases, you may be given additional medicines to help with side effects. Follow all directions for their use. Call your doctor or health care professional for advice if you get a fever, chills or sore throat, or other symptoms of a cold or flu. Do not treat yourself. This drug decreases your body's ability to fight infections. Try to avoid being around people who are sick. This medicine may increase your risk to bruise or bleed. Call your doctor or health care professional if you notice any unusual bleeding. Be careful brushing and flossing your teeth or using a toothpick because you may get an infection or bleed more easily. If you have any dental work done, tell your dentist you are receiving this medicine. Avoid taking products that contain aspirin, acetaminophen, ibuprofen, naproxen, or ketoprofen unless instructed by your doctor. These medicines may hide a fever. Do not become pregnant while taking this medicine. Women should inform their doctor if they wish to become pregnant or think they might be pregnant. There is a potential for serious side effects to an unborn child. Talk to your health care professional or pharmacist for more information. Do not breast-feed an infant while taking this medicine. What side effects may I notice from receiving this medicine? Side effects that you should  report to your doctor or health care professional as soon as possible: -allergic reactions like skin rash, itching or hives, swelling of the face, lips, or tongue -signs of infection - fever or chills, cough, sore throat, pain or difficulty passing urine -signs of decreased platelets or bleeding - bruising, pinpoint red spots on the skin, black, tarry stools, nosebleeds -signs of decreased red blood cells -  unusually weak or tired, fainting spells, lightheadedness -breathing problems -changes in hearing -changes in vision -chest pain -high blood pressure -low blood counts - This drug may decrease the number of white blood cells, red blood cells and platelets. You may be at increased risk for infections and bleeding. -nausea and vomiting -pain, swelling, redness or irritation at the injection site -pain, tingling, numbness in the hands or feet -problems with balance, talking, walking -trouble passing urine or change in the amount of urine Side effects that usually do not require medical attention (report to your doctor or health care professional if they continue or are bothersome): -hair loss -loss of appetite -metallic taste in the mouth or changes in taste This list may not describe all possible side effects. Call your doctor for medical advice about side effects. You may report side effects to FDA at 1-800-FDA-1088. Where should I keep my medicine? This drug is given in a hospital or clinic and will not be stored at home. NOTE: This sheet is a summary. It may not cover all possible information. If you have questions about this medicine, talk to your doctor, pharmacist, or health care provider.  2015, [employer] Standard. (2008-03-15 14:38:05)

## 2015-09-23 ENCOUNTER — Encounter: Payer: Self-pay | Admitting: Radiation Oncology

## 2015-09-23 NOTE — Progress Notes (Signed)
  Radiation Oncology         [phone] ________________________________  Name: Valerie Figueroa MRN: [id number]  Date: 09/23/2015  DOB: [birth year]  End of Treatment Note  Diagnosis:  FIGO Stage IVB (T2a2, N1, M1) squamous cell carcinoma cervix   Indication for treatment:  Severe vaginal bleeding       Radiation treatment dates:   08/31/15 - 09/04/15  Site/dose:   Pelvis 9 Gy in 3 fxs  Beams/energy:   15 x beams, 4 field box setup  Narrative: The patient tolerated radiation treatment relatively well.   Bleeding improved significantly during the course of treatment.  Plan: The patient has completed radiation treatment. The patient will return to radiation oncology clinic for routine followup in one month. I advised them to call or return sooner if they have any questions or concerns related to their recovery or treatment.  -----------------------------------  Blair Promise, PhD, MD

## 2015-09-24 ENCOUNTER — Encounter: Payer: Self-pay | Admitting: Nurse Practitioner

## 2015-09-24 DIAGNOSIS — T7840XA Allergy, unspecified, initial encounter: Secondary | ICD-10-CM | POA: Insufficient documentation

## 2015-09-24 NOTE — Assessment & Plan Note (Signed)
Patient presented to the [facility] today to receive her first cycle of carboplatin/paclitaxel chemotherapy regimen.  However, patient developed a fairly significant hypersensitivity reaction after receiving only 1-2 ML's of the paclitaxel chemotherapy.  Hypersensitivity reaction was managed per protocol; and patient was able to return to her baseline.  However, the decision was made to hold any further chemotherapy today.  Also, the plan is for the patient to initiate Avastin shortly.  Patient is scheduled to return on 09/28/2015 for labs, visit, and chemotherapy.

## 2015-09-24 NOTE — Progress Notes (Signed)
SYMPTOM MANAGEMENT CLINIC   HPI: Valerie Figueroa 59 y.o. female diagnosed with cervical cancer; with lung metastasis.  Presented to the [facility] today to initiate carboplatin/paclitaxel chemotherapy regimen.  Also, plans to initiate Avastin therapy shortly.   Pt presented to the [facility] today to receive her first cycle of carboplatin/paclitaxel chemotherapy.  Patient had received only 1-2 ML's of the paclitaxel; when she developed a hypersensitivity reaction which consisted of shortness of breath, flushing, hypertension, and feeling as if her throat was closing.  She denied any numbness or tingling to her tongue.  Chemotherapy infusion was immediately held; and patient received Solu-Medrol 125 mg IV per protocol.  Confirmed the patient had artery received premedications of dexamethasone, Benadryl, and Pepcid.  O2 was placed via nasal cannula at 2 L; and O2 sat remained stable.  On exam.-Patient's generalized flushing resolved completely following the discontinuation of the chemotherapy.  Bilateral breath sounds did reveal wheezing to all lung fields.  Patient was given an albuterol nebulizer treatment; which did somewhat improve all wheezing.  Patient in no obvious respiratory distress; in managing oral secretions with no difficulty.  All symptoms did resolve; and patient returned to baseline.  After reviewing all findings with Dr. Arnetha Figueroa decision was made to hold any further chemotherapy today.  Also, will prescribe albuterol inhaler for patient to use at home.   HPI  ROS  Past Medical History  Diagnosis Date  . Anemia   . Cervical cancer [facility])     Past Surgical History  Procedure Laterality Date  . Ovarian cyst removal Left 01-17-1985  . Carpal tunnel release Bilateral   . Tubal ligation      has Postmenopausal bleeding probable cervical cancer; Cervix cancer ([place]); Iron deficiency anemia due to chronic blood loss; Breast mass, right; Multiple  pulmonary nodules determined by computed tomography of lung; Continuous tobacco abuse; Need for prophylactic vaccination and inoculation against influenza; and Hypersensitivity reaction on her problem list.    has No Known Allergies.    Medication List       This list is accurate as of: 09/22/15 11:59 PM.  Always use your most recent med list.               acetaminophen 500 MG tablet  Commonly known as:  TYLENOL  Take 1,000 mg by mouth every 8 (eight) hours as needed.     albuterol 108 (90 BASE) MCG/ACT inhaler  Commonly known as:  PROVENTIL HFA;VENTOLIN HFA  Inhale 2 puffs into the lungs every 6 (six) hours as needed for wheezing or shortness of breath.     CENTRUM ULTRA WOMENS Tabs  Take 1 tablet by mouth every morning.     dexamethasone 4 MG tablet  Commonly known as:  DECADRON  Take 5 tablets with food 12 hrs and 6 hrs prior to Taxol chemotherapy.     ferrous sulfate 325 (65 FE) MG tablet  Take 1 tablet (325 mg total) by mouth 3 (three) times daily with meals.     HEMOCYTE-PLUS 106-1 MG Tabs  Take 1 tablet by mouth daily.     LORazepam 0.5 MG tablet  Commonly known as:  ATIVAN  Place 1 tablet under the tongue or swallow every 6 hours as needed for nausea. Will make drowsy.     megestrol 40 MG tablet  Commonly known as:  MEGACE  Take 3 tablets (120 mg total) by mouth daily.     ondansetron 8 MG tablet  Commonly known as:  ZOFRAN  Take 1 tablet (8 mg total) by mouth every 8 (eight) hours as needed for nausea or vomiting.     polyethylene glycol packet  Commonly known as:  MIRALAX / GLYCOLAX  Take 17 g by mouth daily.     traMADol 50 MG tablet  Commonly known as:  ULTRAM  Take 1 tablet (50 mg total) by mouth every 8 (eight) hours as needed.         PHYSICAL EXAMINATION  Oncology Vitals 09/22/2015 09/22/2015 09/22/2015 09/22/2015 09/22/2015 09/22/2015 09/22/2015  Height - - - - - - -  Weight - - - - - - -  Weight (lbs) - - - - - - -  BMI (kg/m2) - - - - - - -    Temp 97.9 - - - - 97 98  Pulse 108 94 120 116 - 74 92  Resp 20 18 20 22  - 18 19  SpO2 100 100 97 100 90 100 100  BSA (m2) - - - - - - -   BP Readings from Last 3 Encounters:  09/22/15 148/58  09/14/15 152/68  09/11/15 139/62    Physical Exam  Constitutional: She is oriented to person, place, and time and well-developed, well-nourished, and in no distress.  HENT:  Head: Normocephalic and atraumatic.  Mouth/Throat: Oropharynx is clear and moist. No oropharyngeal exudate.  Eyes: Conjunctivae and EOM are normal. Pupils are equal, round, and reactive to light. Right eye exhibits no discharge. Left eye exhibits no discharge. No scleral icterus.  Neck: Normal range of motion. Neck supple. No JVD present. No tracheal deviation present. No thyromegaly present.  Cardiovascular: Normal rate, regular rhythm, normal heart sounds and intact distal pulses.   Pulmonary/Chest: Effort normal. No stridor. No respiratory distress. She has wheezes. She has no rales. She exhibits no tenderness.  Patient in no obvious shortness of breath on exam; but was noted to have wheezing to all lung fields.  Nebulizer treatment did improve wheezing.  Patient managing oral secretions with no difficulty.  Abdominal: Soft. Bowel sounds are normal. She exhibits no distension and no mass. There is no tenderness. There is no rebound and no guarding.  Musculoskeletal: Normal range of motion. She exhibits no edema or tenderness.  Lymphadenopathy:    She has no cervical adenopathy.  Neurological: She is alert and oriented to person, place, and time.  Skin: Skin is warm and dry. No rash noted. No erythema.  Psychiatric: Affect normal.  Nursing note and vitals reviewed.   LABORATORY DATA:. Appointment on 09/22/2015  Component Date Value Ref Range Status  . WBC 09/22/2015 6.5  3.9 - 10.3 10e3/uL Final  . NEUT# 09/22/2015 6.0  1.5 - 6.5 10e3/uL Final  . HGB 09/22/2015 12.0  11.6 - 15.9 g/dL Final  . HCT 09/22/2015 36.9   34.8 - 46.6 % Final  . Platelets 09/22/2015 365  145 - 400 10e3/uL Final  . MCV 09/22/2015 85.4  79.5 - 101.0 fL Final  . MCH 09/22/2015 27.8  25.1 - 34.0 pg Final  . MCHC 09/22/2015 32.5  31.5 - 36.0 g/dL Final  . RBC 09/22/2015 4.32  3.70 - 5.45 10e6/uL Final  . RDW 09/22/2015 20.3* 11.2 - 14.5 % Final  . lymph# 09/22/2015 0.5* 0.9 - 3.3 10e3/uL Final  . MONO# 09/22/2015 0.0* 0.1 - 0.9 10e3/uL Final  . Eosinophils Absolute 09/22/2015 0.0  0.0 - 0.5 10e3/uL Final  . Basophils Absolute 09/22/2015 0.0  0.0 - 0.1 10e3/uL Final  . NEUT% 09/22/2015  92.0* 38.4 - 76.8 % Final  . LYMPH% 09/22/2015 7.1* 14.0 - 49.7 % Final  . MONO% 09/22/2015 0.7  0.0 - 14.0 % Final  . EOS% 09/22/2015 0.0  0.0 - 7.0 % Final  . BASO% 09/22/2015 0.2  0.0 - 2.0 % Final  . Sodium 09/22/2015 134* 136 - 145 mEq/L Final  . Potassium 09/22/2015 4.2  3.5 - 5.1 mEq/L Final  . Chloride 09/22/2015 105  98 - 109 mEq/L Final  . CO2 09/22/2015 17* 22 - 29 mEq/L Final  . Glucose 09/22/2015 153* 70 - 140 mg/dl Final   Glucose reference range is for nonfasting patients. Fasting glucose reference range is 70- 100.  Marland Kitchen BUN 09/22/2015 12.7  7.0 - 26.0 mg/dL Final  . Creatinine 09/22/2015 0.8  0.6 - 1.1 mg/dL Final  . Total Bilirubin 09/22/2015 <0.30  0.20 - 1.20 mg/dL Final  . Alkaline Phosphatase 09/22/2015 92  40 - 150 U/L Final  . AST 09/22/2015 19  5 - 34 U/L Final  . ALT 09/22/2015 40  0 - 55 U/L Final  . Total Protein 09/22/2015 8.3  6.4 - 8.3 g/dL Final  . Albumin 09/22/2015 3.7  3.5 - 5.0 g/dL Final  . Calcium 09/22/2015 9.9  8.4 - 10.4 mg/dL Final  . Anion Gap 09/22/2015 12* 3 - 11 mEq/L Final  . EGFR 09/22/2015 82* >90 ml/min/1.73 m2 Final   eGFR is calculated using the CKD-EPI Creatinine Equation (2009)     RADIOGRAPHIC STUDIES: No results found.  ASSESSMENT/PLAN:    Hypersensitivity reaction Pt presented to the [facility] today to receive her first cycle of carboplatin/paclitaxel chemotherapy.  Patient had  received only 1-2 ML's of the paclitaxel; when she developed a hypersensitivity reaction which consisted of shortness of breath, flushing, hypertension, and feeling as if her throat was closing.  She denied any numbness or tingling to her tongue.  Chemotherapy infusion was immediately held; and patient received Solu-Medrol 125 mg IV per protocol.  Confirmed the patient had artery received premedications of dexamethasone, Benadryl, and Pepcid.  O2 was placed via nasal cannula at 2 L; and O2 sat remained stable.  On exam.-Patient's generalized flushing resolved completely following the discontinuation of the chemotherapy.  Bilateral breath sounds did reveal wheezing to all lung fields.  Patient was given an albuterol nebulizer treatment; which did somewhat improve all wheezing.  Patient in no obvious respiratory distress; in managing oral secretions with no difficulty.  All symptoms did resolve; and patient returned to baseline.  After reviewing all findings with Dr. Arnetha Figueroa decision was made to hold any further chemotherapy today.  Also, will prescribe albuterol inhaler for patient to use at home.   Cervix cancer [facility]) Patient presented to the [facility] today to receive her first cycle of carboplatin/paclitaxel chemotherapy regimen.  However, patient developed a fairly significant hypersensitivity reaction after receiving only 1-2 ML's of the paclitaxel chemotherapy.  Hypersensitivity reaction was managed per protocol; and patient was able to return to her baseline.  However, the decision was made to hold any further chemotherapy today.  Also, the plan is for the patient to initiate Avastin shortly.  Patient is scheduled to return on 09/28/2015 for labs, visit, and chemotherapy.  Patient stated understanding of all instructions; and was in agreement with this plan of care. The patient knows to call the clinic with any problems, questions or concerns.   Review/collaboration with Dr.  Marko Plume regarding all aspects of patient's visit today.   Total time spent with patient  was 25 minutes;  with greater than 75 percent of that time spent in face to face counseling regarding patient's symptoms,  and coordination of care and follow up.  Disclaimer:This dictation was prepared with Dragon/digital dictation along with [employer]. Any transcriptional errors that result from this process are unintentional.  Drue Second, NP 09/24/2015

## 2015-09-24 NOTE — Assessment & Plan Note (Signed)
Pt presented to the [facility] today to receive her first cycle of carboplatin/paclitaxel chemotherapy.  Patient had received only 1-2 ML's of the paclitaxel; when she developed a hypersensitivity reaction which consisted of shortness of breath, flushing, hypertension, and feeling as if her throat was closing.  She denied any numbness or tingling to her tongue.  Chemotherapy infusion was immediately held; and patient received Solu-Medrol 125 mg IV per protocol.  Confirmed the patient had artery received premedications of dexamethasone, Benadryl, and Pepcid.  O2 was placed via nasal cannula at 2 L; and O2 sat remained stable.  On exam.-Patient's generalized flushing resolved completely following the discontinuation of the chemotherapy.  Bilateral breath sounds did reveal wheezing to all lung fields.  Patient was given an albuterol nebulizer treatment; which did somewhat improve all wheezing.  Patient in no obvious respiratory distress; in managing oral secretions with no difficulty.  All symptoms did resolve; and patient returned to baseline.  After reviewing all findings with Dr. Arnetha Courser decision was made to hold any further chemotherapy today.  Also, will prescribe albuterol inhaler for patient to use at home.

## 2015-09-26 ENCOUNTER — Telehealth: Payer: Self-pay

## 2015-09-26 NOTE — Telephone Encounter (Signed)
Valerie Figueroa states that she has been doing fine since her reaction to the Taxol chemotherapy on 09-22-15. Told her that Dr. Marko Plume will discuss treatment plan with her at visit 09-28-15. She may not receive any treatment on 09-28-15 as this was previously set up for D8 of treatment. Pt appreciated the follow up call.

## 2015-09-27 ENCOUNTER — Other Ambulatory Visit: Payer: Self-pay | Admitting: Oncology

## 2015-09-28 ENCOUNTER — Other Ambulatory Visit (HOSPITAL_BASED_OUTPATIENT_CLINIC_OR_DEPARTMENT_OTHER): Payer: BLUE CROSS/BLUE SHIELD

## 2015-09-28 ENCOUNTER — Ambulatory Visit: Payer: BLUE CROSS/BLUE SHIELD

## 2015-09-28 ENCOUNTER — Telehealth: Payer: Self-pay | Admitting: Oncology

## 2015-09-28 ENCOUNTER — Ambulatory Visit (HOSPITAL_BASED_OUTPATIENT_CLINIC_OR_DEPARTMENT_OTHER): Payer: BLUE CROSS/BLUE SHIELD | Admitting: Oncology

## 2015-09-28 ENCOUNTER — Encounter: Payer: Self-pay | Admitting: Oncology

## 2015-09-28 VITALS — BP 167/68 | HR 82 | Temp 98.1°F | Resp 18 | Ht 64.0 in | Wt 137.4 lb

## 2015-09-28 DIAGNOSIS — Z720 Tobacco use: Secondary | ICD-10-CM

## 2015-09-28 DIAGNOSIS — C539 Malignant neoplasm of cervix uteri, unspecified: Secondary | ICD-10-CM

## 2015-09-28 DIAGNOSIS — R918 Other nonspecific abnormal finding of lung field: Secondary | ICD-10-CM

## 2015-09-28 DIAGNOSIS — T7840XD Allergy, unspecified, subsequent encounter: Secondary | ICD-10-CM

## 2015-09-28 LAB — COMPREHENSIVE METABOLIC PANEL (CC13)
ALBUMIN: 3.5 g/dL (ref 3.5–5.0)
ALK PHOS: 97 U/L (ref 40–150)
ALT: 47 U/L (ref 0–55)
ANION GAP: 8 meq/L (ref 3–11)
AST: 23 U/L (ref 5–34)
BUN: 15.9 mg/dL (ref 7.0–26.0)
CO2: 21 mEq/L — ABNORMAL LOW (ref 22–29)
CREATININE: 0.7 mg/dL (ref 0.6–1.1)
Calcium: 10 mg/dL (ref 8.4–10.4)
Chloride: 104 mEq/L (ref 98–109)
Glucose: 91 mg/dl (ref 70–140)
Potassium: 4 mEq/L (ref 3.5–5.1)
Sodium: 133 mEq/L — ABNORMAL LOW (ref 136–145)
TOTAL PROTEIN: 7.4 g/dL (ref 6.4–8.3)

## 2015-09-28 LAB — CBC WITH DIFFERENTIAL/PLATELET
BASO%: 0.2 % (ref 0.0–2.0)
Basophils Absolute: 0 10*3/uL (ref 0.0–0.1)
EOS ABS: 0.3 10*3/uL (ref 0.0–0.5)
EOS%: 2.6 % (ref 0.0–7.0)
HEMATOCRIT: 38.1 % (ref 34.8–46.6)
HEMOGLOBIN: 12.5 g/dL (ref 11.6–15.9)
LYMPH#: 1.7 10*3/uL (ref 0.9–3.3)
LYMPH%: 15.2 % (ref 14.0–49.7)
MCH: 28.2 pg (ref 25.1–34.0)
MCHC: 32.8 g/dL (ref 31.5–36.0)
MCV: 86 fL (ref 79.5–101.0)
MONO#: 1.4 10*3/uL — AB (ref 0.1–0.9)
MONO%: 12.8 % (ref 0.0–14.0)
NEUT%: 69.2 % (ref 38.4–76.8)
NEUTROS ABS: 7.6 10*3/uL — AB (ref 1.5–6.5)
PLATELETS: 308 10*3/uL (ref 145–400)
RBC: 4.43 10*6/uL (ref 3.70–5.45)
RDW: 18.5 % — AB (ref 11.2–14.5)
WBC: 11 10*3/uL — AB (ref 3.9–10.3)

## 2015-09-28 NOTE — Telephone Encounter (Signed)
Appointments made and avs printed for patient,email to maggie and michelle about 10/13 chemo and  i will call the patient,karen to change dr Sondra Come to 10/26 8:30

## 2015-09-28 NOTE — Progress Notes (Signed)
OFFICE PROGRESS NOTE   September 28, 2015   [place], [place], MD, Gery Pray  INTERVAL HISTORY:  Patient is seen, alone for visit, in continuing attention to metastatic cervical cancer, with complication of acute, severe taxol reaction with attempted cycle 1 carbo taxol on 09-22-15.  The taxol reaction happened after only ~ 2 ml of taxol had infused, despite full oral and IV premedication. Carboplatin had not been given and reaction was too severe to attempt further taxol, tho symptoms resolved with emergent interventions in [place] infusion area and patient was stable for DC home. She had no further symptoms of the reaction after leaving [place]. She has had no other different problems. She has no vaginal bleeding now. She denies increased SOB or cough, has not needed inhaler. Appetite is reasonable. She generally is sleepy early evening, then wakens ~ 0300 after ~ 8 hrs sleep, this pattern similar to previous work schedule. Bowels and bladder ok. She is eating and drinking fluids. Peripheral IV access was not difficult for attempted chemo on 09-22-15.   No PAC, however peripheral IV access not easy and she may need PAC, would need to adjust start of avastin if PAC needed. Flu vaccine done 09-01-15    ONCOLOGIC HISTORY Patient has had no regular medical care in years, including no PAP in >15 years. She had no bleeding since menopause until several hours of heavy vaginal bleeding in 04-2015, which resolved other than intermittent spotting until extremely heavy vaginal bleeding again 08-08-15. She was seen in [facility] ED then, with Hgb 8.0; she was given premarin and continued on oral megace x 2 weeks. She was seen by Dr Mallory Shirk on 08-16-15 , with finding of large fungating cervical mass with parametrial involvement. Cervical biopsy 08-17-15 ([id number]) showed moderately to poorly differentiated squamous cell carcinoma. She had CT CAP in [facility] system on 08-21-15, with  1.7 cm oval mass upper outer right breast, multiple prominent mediastinal lymph nodes and multiiple bilateral pulmonary nodules including a 2.7 x 1.8 cm nodule in RLL., retroperitoneal and external iliac adenopathy, 4.2 x 5.2 x 6.1 cm mass involving cervix and lower uterine segment, 1 cm sclerotic lesion in lateral left fifth rib, liver ok and no ureteral obstruction. She saw Dr Alycia Rossetti on 08-23-15, who agreed with diagnosis of cervical cancer at least IIIB, possibly IV if pulmonary mets. PET pending, recommendation if stage IV to use short course RT to control bleeding then carbo taxol +/- avastin; if PET shows pelvis only then radiation with sensitizing CDDP. Patient saw Dr Sondra Come in consultation on 08-30-15, with vaginal packing required for heavy bleeding. She had urgent radiation thru 09-04-15. PET 09-06-15 had uptake in cervical mass, periaortic and iliac nodes, left supraclavicular node, left subpectoral and axillary nodes, and in right breast mass. Biposy of right breast 09-08-15 benign. Carboplatin taxol was attempted on 09-22-15, with acute, severe taxol reaction after ~ 2 ml taxol had infused, this despite full oral and IV premedication for taxol.     Review of systems as above, also: No fever or symptoms of infection. No pain. No LE swelling. Remainder of 10 point Review of Systems negative.  Objective:  Vital signs in last 24 hours:  BP 167/68 mmHg  Pulse 82  Temp(Src) 98.1 F (36.7 C) (Oral)  Resp 18  Ht 5' 4"  (1.626 m)  Wt 137 lb 6.4 oz (62.324 kg)  BMI 23.57 kg/m2  SpO2 100%  LMP 08/25/2015 Weight up 4 lbs Alert, oriented and appropriate.  Ambulatory without assistance.  No alopecia  HEENT:PERRL, sclerae not icteric. Oral mucosa moist without lesions, posterior pharynx clear.  Neck supple. No JVD.  Lymphatics:no cervical and no change supraclavicular adenopathy Resp: rather diminished BS thruout otherwise clear to auscultation bilaterally and no dullness to percussion  bilaterally Cardio: regular rate and rhythm. No gallop. GI: soft, nontender, not distended, no mass or organomegaly. Normally active bowel sounds.  Musculoskeletal/ Extremities: without pitting edema, cords, tenderness Neuro: no peripheral neuropathy. Otherwise nonfocal. PSYCH appropriate mood and affect Skin without rash, ecchymosis, petechiae   Lab Results:  Results for orders placed or performed in visit on 09/28/15  CBC with Differential  Result Value Ref Range   WBC 11.0 (H) 3.9 - 10.3 10e3/uL   NEUT# 7.6 (H) 1.5 - 6.5 10e3/uL   HGB 12.5 11.6 - 15.9 g/dL   HCT 38.1 34.8 - 46.6 %   Platelets 308 145 - 400 10e3/uL   MCV 86.0 79.5 - 101.0 fL   MCH 28.2 25.1 - 34.0 pg   MCHC 32.8 31.5 - 36.0 g/dL   RBC 4.43 3.70 - 5.45 10e6/uL   RDW 18.5 (H) 11.2 - 14.5 %   lymph# 1.7 0.9 - 3.3 10e3/uL   MONO# 1.4 (H) 0.1 - 0.9 10e3/uL   Eosinophils Absolute 0.3 0.0 - 0.5 10e3/uL   Basophils Absolute 0.0 0.0 - 0.1 10e3/uL   NEUT% 69.2 38.4 - 76.8 %   LYMPH% 15.2 14.0 - 49.7 %   MONO% 12.8 0.0 - 14.0 %   EOS% 2.6 0.0 - 7.0 %   BASO% 0.2 0.0 - 2.0 %  Comprehensive metabolic panel (Cmet) - [facility]  Result Value Ref Range   Sodium 133 (L) 136 - 145 mEq/L   Potassium 4.0 3.5 - 5.1 mEq/L   Chloride 104 98 - 109 mEq/L   CO2 21 (L) 22 - 29 mEq/L   Glucose 91 70 - 140 mg/dl   BUN 15.9 7.0 - 26.0 mg/dL   Creatinine 0.7 0.6 - 1.1 mg/dL   Total Bilirubin <0.30 0.20 - 1.20 mg/dL   Alkaline Phosphatase 97 40 - 150 U/L   AST 23 5 - 34 U/L   ALT 47 0 - 55 U/L   Total Protein 7.4 6.4 - 8.3 g/dL   Albumin 3.5 3.5 - 5.0 g/dL   Calcium 10.0 8.4 - 10.4 mg/dL   Anion Gap 8 3 - 11 mEq/L   EGFR >90 >90 ml/min/1.73 m2     Studies/Results:  No results found.  Medications: I have reviewed the patient's current medications.  DISCUSSION: acute anaphylactic reaction to first ml's of taxol despite full premedication, such that we will not attempt that drug again. I have recommended that we change regimen to  every other week CDDP gemzar. She has previously had teaching for CDDP, which RN has reviewed now, and she has had teaching for gemzar now. She understands that this chemo will be given in attempt to control and hopefully shrink cancer for as long as possible, but that this is not a situation of potential cure. She understands necessary oral prehydration for CDDP. Verbal consent for treatment given She prefers early AM appointments as possible.  Assessment/Plan:  1. 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 Bleeding resolved since short course palliative radiation. With anaphylaxis to taxol, will change regimen to CDDP gemzar, begin next week if possible. May consider addition of avastin, but will begin with chemo only. I  will see her with lab 7-10 days after first chemo 2.acute anaphylactic reaction to taxol 09-22-15 despite full premedication 3.long and ongoing tobacco, which she is now actively tryiing to stop. Multiple bilateral pulmonary nodules likely too small for PET to clarify.  4..iron deficiency anemia with recent heavy vaginal bleeding: Transfused 2 units PRBCs + 567m feraheme on 09-11-15. Will change oral iron to Hemocyte/ ferrous fumarate and follow.Hemoglobin back in normal range now 5..right breast mass upper outer quadrant: biopsy with benign findings, 6 month follow up diagnostic mammogram + UKorearecommended  6. Never colonoscopy 7.post carpal tunnel release 8.sclerotic rib lesion on CT no correlate on PET 9.CAD by CT 10.cholelithiasis by CT 11.abnormal enhancement right renal collecting system by CT 12.peripheral venous access easier than anticipated, and [facility] 13.[place] [facility] She understands that medical oncology care could also transfer there if she prefers. 14.flu vaccine given 09-01-15  All  questions answered. Chemo orders changed, preauthorization requested. Note neulasta already approved if needed. Verbal consent given. CC Dr GAlycia Rossetti Time spent 30 min including >50% counseling and coordination of care.   Kori Colin P, MD   09/28/2015, 8:09 PM

## 2015-09-29 ENCOUNTER — Telehealth: Payer: Self-pay | Admitting: Oncology

## 2015-09-29 NOTE — Telephone Encounter (Signed)
Called patient and she is aware of her 10/14 chemo

## 2015-10-01 DIAGNOSIS — C539 Malignant neoplasm of cervix uteri, unspecified: Secondary | ICD-10-CM | POA: Insufficient documentation

## 2015-10-05 ENCOUNTER — Ambulatory Visit: Payer: BLUE CROSS/BLUE SHIELD | Admitting: Oncology

## 2015-10-05 ENCOUNTER — Other Ambulatory Visit: Payer: BLUE CROSS/BLUE SHIELD

## 2015-10-05 ENCOUNTER — Encounter: Payer: Self-pay | Admitting: *Deleted

## 2015-10-05 NOTE — Progress Notes (Signed)
[place] Psychosocial Distress Screening Clinical Social Work  Clinical Social Work was referred by distress screening protocol.  The patient scored a 5 on the Psychosocial Distress Thermometer which indicates moderate distress. Clinical Social Worker contacted patient by phone to assess for distress and other psychosocial needs. Mrs. Esteves-Herrera shared she recently had a reaction to taxol and she will be starting on a new chemo this week.  She shared the most distressing cause throughout treatment is the "unknown" and "not knowing what to expect".  CSW validated patient's feelings and provided brief emotional support and education.  The patient reported her spouse is her main support and he has "been with her every step of the way".  She previously indicated on her distress screen that work was a major source of distress.  She has since taken [facility] and is not working at this time. CSW encouraged patient to attend GYN Cancer Support Group.  ONCBCN DISTRESS SCREENING 09/07/2015 08/30/2015  Screening Type Initial Screening Initial Screening  Distress experienced in past week (1-10) 4 5  Practical problem type Work/school Work/school  Family Problem type (No Data)   Emotional problem type  Adjusting to illness  Spiritual/Religous concerns type (No Data)   Information Concerns Type (No Data)   Physical Problem type Pain   Physician notified of physical symptoms Yes   Referral to clinical psychology No   Referral to clinical social work No     Clinical Social Worker follow up needed: No.  If yes, follow up plan:  Polo Riley, MSW, LCSW, OSW-C Clinical Social Worker [place] [phone]

## 2015-10-06 ENCOUNTER — Ambulatory Visit (HOSPITAL_BASED_OUTPATIENT_CLINIC_OR_DEPARTMENT_OTHER): Payer: BLUE CROSS/BLUE SHIELD

## 2015-10-06 ENCOUNTER — Encounter: Payer: Self-pay | Admitting: Oncology

## 2015-10-06 ENCOUNTER — Other Ambulatory Visit (HOSPITAL_BASED_OUTPATIENT_CLINIC_OR_DEPARTMENT_OTHER): Payer: BLUE CROSS/BLUE SHIELD

## 2015-10-06 VITALS — BP 155/68 | HR 91 | Temp 98.9°F | Resp 16

## 2015-10-06 DIAGNOSIS — C539 Malignant neoplasm of cervix uteri, unspecified: Secondary | ICD-10-CM

## 2015-10-06 DIAGNOSIS — Z5111 Encounter for antineoplastic chemotherapy: Secondary | ICD-10-CM

## 2015-10-06 DIAGNOSIS — C531 Malignant neoplasm of exocervix: Secondary | ICD-10-CM

## 2015-10-06 LAB — COMPREHENSIVE METABOLIC PANEL (CC13)
ALBUMIN: 3.6 g/dL (ref 3.5–5.0)
ALK PHOS: 110 U/L (ref 40–150)
ALT: 40 U/L (ref 0–55)
ANION GAP: 11 meq/L (ref 3–11)
AST: 19 U/L (ref 5–34)
BUN: 11.3 mg/dL (ref 7.0–26.0)
CALCIUM: 9.6 mg/dL (ref 8.4–10.4)
CHLORIDE: 100 meq/L (ref 98–109)
CO2: 20 mEq/L — ABNORMAL LOW (ref 22–29)
Creatinine: 0.7 mg/dL (ref 0.6–1.1)
Glucose: 93 mg/dl (ref 70–140)
POTASSIUM: 3.4 meq/L — AB (ref 3.5–5.1)
Sodium: 132 mEq/L — ABNORMAL LOW (ref 136–145)
Total Bilirubin: <0.3 mg/dL (ref 0.20–1.20)
Total Protein: 7.8 g/dL (ref 6.4–8.3)

## 2015-10-06 LAB — CBC WITH DIFFERENTIAL/PLATELET
BASO%: 0.5 % (ref 0.0–2.0)
BASOS ABS: 0.1 10*3/uL (ref 0.0–0.1)
EOS ABS: 0.1 10*3/uL (ref 0.0–0.5)
EOS%: 1.3 % (ref 0.0–7.0)
HEMATOCRIT: 36.1 % (ref 34.8–46.6)
HEMOGLOBIN: 12.1 g/dL (ref 11.6–15.9)
LYMPH#: 1.3 10*3/uL (ref 0.9–3.3)
LYMPH%: 11.7 % — ABNORMAL LOW (ref 14.0–49.7)
MCH: 28.7 pg (ref 25.1–34.0)
MCHC: 33.5 g/dL (ref 31.5–36.0)
MCV: 85.7 fL (ref 79.5–101.0)
MONO#: 0.9 10*3/uL (ref 0.1–0.9)
MONO%: 8.6 % (ref 0.0–14.0)
NEUT#: 8.4 10*3/uL — ABNORMAL HIGH (ref 1.5–6.5)
NEUT%: 77.9 % — ABNORMAL HIGH (ref 38.4–76.8)
Platelets: 278 10*3/uL (ref 145–400)
RBC: 4.21 10*6/uL (ref 3.70–5.45)
RDW: 20 % — AB (ref 11.2–14.5)
WBC: 10.8 10*3/uL — ABNORMAL HIGH (ref 3.9–10.3)

## 2015-10-06 MED ORDER — PALONOSETRON HCL INJECTION 0.25 MG/5ML
INTRAVENOUS | Status: AC
  Filled 2015-10-06: qty 5

## 2015-10-06 MED ORDER — POTASSIUM CHLORIDE 2 MEQ/ML IV SOLN
Freq: Once | INTRAVENOUS | Status: AC
  Administered 2015-10-06: 09:00:00 via INTRAVENOUS
  Filled 2015-10-06: qty 10

## 2015-10-06 MED ORDER — SODIUM CHLORIDE 0.9 % IV SOLN
Freq: Once | INTRAVENOUS | Status: AC
  Administered 2015-10-06: 09:00:00 via INTRAVENOUS

## 2015-10-06 MED ORDER — GEMCITABINE HCL CHEMO INJECTION 1 GM/26.3ML
600.0000 mg/m2 | Freq: Once | INTRAVENOUS | Status: AC
  Administered 2015-10-06: 1026 mg via INTRAVENOUS
  Filled 2015-10-06: qty 26.98

## 2015-10-06 MED ORDER — CISPLATIN CHEMO INJECTION 100MG/100ML
30.0000 mg/m2 | Freq: Once | INTRAVENOUS | Status: AC
  Administered 2015-10-06: 50 mg via INTRAVENOUS
  Filled 2015-10-06: qty 50

## 2015-10-06 MED ORDER — SODIUM CHLORIDE 0.9 % IV SOLN
Freq: Once | INTRAVENOUS | Status: AC
  Administered 2015-10-06: 11:00:00 via INTRAVENOUS
  Filled 2015-10-06: qty 5

## 2015-10-06 MED ORDER — PALONOSETRON HCL INJECTION 0.25 MG/5ML
0.2500 mg | Freq: Once | INTRAVENOUS | Status: AC
  Administered 2015-10-06: 0.25 mg via INTRAVENOUS

## 2015-10-06 NOTE — Progress Notes (Signed)
I sent forms back to address on cover page and mailed the patient a copy. I also faxed [phone]

## 2015-10-06 NOTE — Patient Instructions (Signed)
[facility] Discharge Instructions for Patients Receiving Chemotherapy  Today you received the following chemotherapy agents gemzar/cisplatin  To help prevent nausea and vomiting after your treatment, we encourage you to take your nausea medication as directed   If you develop nausea and vomiting that is not controlled by your nausea medication, call the clinic.   BELOW ARE SYMPTOMS THAT SHOULD BE REPORTED IMMEDIATELY:  *FEVER GREATER THAN 100.5 F  *CHILLS WITH OR WITHOUT FEVER  NAUSEA AND VOMITING THAT IS NOT CONTROLLED WITH YOUR NAUSEA MEDICATION  *UNUSUAL SHORTNESS OF BREATH  *UNUSUAL BRUISING OR BLEEDING  TENDERNESS IN MOUTH AND THROAT WITH OR WITHOUT PRESENCE OF ULCERS  *URINARY PROBLEMS  *BOWEL PROBLEMS  UNUSUAL RASH Items with * indicate a potential emergency and should be followed up as soon as possible.  Gemcitabine injection What is this medicine? GEMCITABINE (jem SIT a been) is a chemotherapy drug. This medicine is used to treat many types of cancer like breast cancer, lung cancer, pancreatic cancer, and ovarian cancer. This medicine may be used for other purposes; ask your health care provider or pharmacist if you have questions. What should I tell my health care provider before I take this medicine? They need to know if you have any of these conditions: -blood disorders -infection -kidney disease -liver disease -recent or ongoing radiation therapy -an unusual or allergic reaction to gemcitabine, other chemotherapy, other medicines, foods, dyes, or preservatives -pregnant or trying to get pregnant -breast-feeding How should I use this medicine? This drug is given as an infusion into a vein. It is administered in a hospital or clinic by a specially trained health care professional. Talk to your pediatrician regarding the use of this medicine in children. Special care may be needed. Overdosage: If you think you have taken too much of this  medicine contact a poison control center or emergency room at once. NOTE: This medicine is only for you. Do not share this medicine with others. What if I miss a dose? It is important not to miss your dose. Call your doctor or health care professional if you are unable to keep an appointment. What may interact with this medicine? -medicines to increase blood counts like filgrastim, pegfilgrastim, sargramostim -some other chemotherapy drugs like cisplatin -vaccines Talk to your doctor or health care professional before taking any of these medicines: -acetaminophen -aspirin -ibuprofen -ketoprofen -naproxen This list may not describe all possible interactions. Give your health care provider a list of all the medicines, herbs, non-prescription drugs, or dietary supplements you use. Also tell them if you smoke, drink alcohol, or use illegal drugs. Some items may interact with your medicine. What should I watch for while using this medicine? Visit your doctor for checks on your progress. This drug may make you feel generally unwell. This is not uncommon, as chemotherapy can affect healthy cells as well as cancer cells. Report any side effects. Continue your course of treatment even though you feel ill unless your doctor tells you to stop. In some cases, you may be given additional medicines to help with side effects. Follow all directions for their use. Call your doctor or health care professional for advice if you get a fever, chills or sore throat, or other symptoms of a cold or flu. Do not treat yourself. This drug decreases your body's ability to fight infections. Try to avoid being around people who are sick. This medicine may increase your risk to bruise or bleed. Call your doctor or health care professional if  you notice any unusual bleeding. Be careful brushing and flossing your teeth or using a toothpick because you may get an infection or bleed more easily. If you have any dental work done,  tell your dentist you are receiving this medicine. Avoid taking products that contain aspirin, acetaminophen, ibuprofen, naproxen, or ketoprofen unless instructed by your doctor. These medicines may hide a fever. Women should inform their doctor if they wish to become pregnant or think they might be pregnant. There is a potential for serious side effects to an unborn child. Talk to your health care professional or pharmacist for more information. Do not breast-feed an infant while taking this medicine. What side effects may I notice from receiving this medicine? Side effects that you should report to your doctor or health care professional as soon as possible: -allergic reactions like skin rash, itching or hives, swelling of the face, lips, or tongue -low blood counts - this medicine may decrease the number of white blood cells, red blood cells and platelets. You may be at increased risk for infections and bleeding. -signs of infection - fever or chills, cough, sore throat, pain or difficulty passing urine -signs of decreased platelets or bleeding - bruising, pinpoint red spots on the skin, black, tarry stools, blood in the urine -signs of decreased red blood cells - unusually weak or tired, fainting spells, lightheadedness -breathing problems -chest pain -mouth sores -nausea and vomiting -pain, swelling, redness at site where injected -pain, tingling, numbness in the hands or feet -stomach pain -swelling of ankles, feet, hands -unusual bleeding Side effects that usually do not require medical attention (report to your doctor or health care professional if they continue or are bothersome): -constipation -diarrhea -hair loss -loss of appetite -stomach upset This list may not describe all possible side effects. Call your doctor for medical advice about side effects. You may report side effects to FDA at 1-800-FDA-1088. Where should I keep my medicine? This drug is given in a hospital or  clinic and will not be stored at home. NOTE: This sheet is a summary. It may not cover all possible information. If you have questions about this medicine, talk to your doctor, pharmacist, or health care provider.    2016, Elsevier/Gold Standard. (2008-04-19 18:45:54)   Feel free to call the clinic you have any questions or concerns. The clinic phone number is [phone].  Cisplatin injection What is this medicine? CISPLATIN (SIS pla tin) is a chemotherapy drug. It targets fast dividing cells, like cancer cells, and causes these cells to die. This medicine is used to treat many types of cancer like bladder, ovarian, and testicular cancers. This medicine may be used for other purposes; ask your health care provider or pharmacist if you have questions. What should I tell my health care provider before I take this medicine? They need to know if you have any of these conditions: -blood disorders -hearing problems -kidney disease -recent or ongoing radiation therapy -an unusual or allergic reaction to cisplatin, carboplatin, other chemotherapy, other medicines, foods, dyes, or preservatives -pregnant or trying to get pregnant -breast-feeding How should I use this medicine? This drug is given as an infusion into a vein. It is administered in a hospital or clinic by a specially trained health care professional. Talk to your pediatrician regarding the use of this medicine in children. Special care may be needed. Overdosage: If you think you have taken too much of this medicine contact a poison control center or emergency room at once. NOTE:  This medicine is only for you. Do not share this medicine with others. What if I miss a dose? It is important not to miss a dose. Call your doctor or health care professional if you are unable to keep an appointment. What may interact with this medicine? -dofetilide -foscarnet -medicines for seizures -medicines to increase blood counts like  filgrastim, pegfilgrastim, sargramostim -probenecid -pyridoxine used with altretamine -rituximab -some antibiotics like amikacin, gentamicin, neomycin, polymyxin B, streptomycin, tobramycin -sulfinpyrazone -vaccines -zalcitabine Talk to your doctor or health care professional before taking any of these medicines: -acetaminophen -aspirin -ibuprofen -ketoprofen -naproxen This list may not describe all possible interactions. Give your health care provider a list of all the medicines, herbs, non-prescription drugs, or dietary supplements you use. Also tell them if you smoke, drink alcohol, or use illegal drugs. Some items may interact with your medicine. What should I watch for while using this medicine? Your condition will be monitored carefully while you are receiving this medicine. You will need important blood work done while you are taking this medicine. This drug may make you feel generally unwell. This is not uncommon, as chemotherapy can affect healthy cells as well as cancer cells. Report any side effects. Continue your course of treatment even though you feel ill unless your doctor tells you to stop. In some cases, you may be given additional medicines to help with side effects. Follow all directions for their use. Call your doctor or health care professional for advice if you get a fever, chills or sore throat, or other symptoms of a cold or flu. Do not treat yourself. This drug decreases your body's ability to fight infections. Try to avoid being around people who are sick. This medicine may increase your risk to bruise or bleed. Call your doctor or health care professional if you notice any unusual bleeding. Be careful brushing and flossing your teeth or using a toothpick because you may get an infection or bleed more easily. If you have any dental work done, tell your dentist you are receiving this medicine. Avoid taking products that contain aspirin, acetaminophen, ibuprofen,  naproxen, or ketoprofen unless instructed by your doctor. These medicines may hide a fever. Do not become pregnant while taking this medicine. Women should inform their doctor if they wish to become pregnant or think they might be pregnant. There is a potential for serious side effects to an unborn child. Talk to your health care professional or pharmacist for more information. Do not breast-feed an infant while taking this medicine. Drink fluids as directed while you are taking this medicine. This will help protect your kidneys. Call your doctor or health care professional if you get diarrhea. Do not treat yourself. What side effects may I notice from receiving this medicine? Side effects that you should report to your doctor or health care professional as soon as possible: -allergic reactions like skin rash, itching or hives, swelling of the face, lips, or tongue -signs of infection - fever or chills, cough, sore throat, pain or difficulty passing urine -signs of decreased platelets or bleeding - bruising, pinpoint red spots on the skin, black, tarry stools, nosebleeds -signs of decreased red blood cells - unusually weak or tired, fainting spells, lightheadedness -breathing problems -changes in hearing -gout pain -low blood counts - This drug may decrease the number of white blood cells, red blood cells and platelets. You may be at increased risk for infections and bleeding. -nausea and vomiting -pain, swelling, redness or irritation at the injection site -  pain, tingling, numbness in the hands or feet -problems with balance, movement -trouble passing urine or change in the amount of urine Side effects that usually do not require medical attention (report to your doctor or health care professional if they continue or are bothersome): -changes in vision -loss of appetite -metallic taste in the mouth or changes in taste This list may not describe all possible side effects. Call your doctor for  medical advice about side effects. You may report side effects to FDA at 1-800-FDA-1088. Where should I keep my medicine? This drug is given in a hospital or clinic and will not be stored at home. NOTE: This sheet is a summary. It may not cover all possible information. If you have questions about this medicine, talk to your doctor, pharmacist, or health care provider.    2016, Elsevier/Gold Standard. (2008-03-15 14:40:54)

## 2015-10-09 ENCOUNTER — Telehealth: Payer: Self-pay

## 2015-10-09 NOTE — Telephone Encounter (Signed)
Valerie Figueroa is doing very well. She is eating and drinking fluids very well.  Taking in at least 64 oz daily.  Bowels moving well. She knows to call [phone] prior to next visit if any questions or concerns.

## 2015-10-09 NOTE — Telephone Encounter (Signed)
-----   Message from Arty Baumgartner, RN sent at 10/06/2015  8:32 AM EDT ----- Regarding: first time chemo/[facility] First time gemzar/cisplatin.  Dr. Marko Plume.  [phone]

## 2015-10-10 ENCOUNTER — Other Ambulatory Visit: Payer: Self-pay | Admitting: Oncology

## 2015-10-12 ENCOUNTER — Encounter: Payer: Self-pay | Admitting: Oncology

## 2015-10-12 ENCOUNTER — Ambulatory Visit: Payer: BLUE CROSS/BLUE SHIELD | Admitting: Radiation Oncology

## 2015-10-12 ENCOUNTER — Telehealth: Payer: Self-pay | Admitting: Oncology

## 2015-10-12 ENCOUNTER — Ambulatory Visit (HOSPITAL_BASED_OUTPATIENT_CLINIC_OR_DEPARTMENT_OTHER): Payer: BLUE CROSS/BLUE SHIELD | Admitting: Oncology

## 2015-10-12 ENCOUNTER — Other Ambulatory Visit (HOSPITAL_BASED_OUTPATIENT_CLINIC_OR_DEPARTMENT_OTHER): Payer: BLUE CROSS/BLUE SHIELD

## 2015-10-12 VITALS — BP 154/63 | HR 91 | Temp 98.2°F | Resp 18 | Ht 64.0 in | Wt 141.4 lb

## 2015-10-12 DIAGNOSIS — C539 Malignant neoplasm of cervix uteri, unspecified: Secondary | ICD-10-CM

## 2015-10-12 DIAGNOSIS — I2510 Atherosclerotic heart disease of native coronary artery without angina pectoris: Secondary | ICD-10-CM | POA: Diagnosis not present

## 2015-10-12 DIAGNOSIS — C778 Secondary and unspecified malignant neoplasm of lymph nodes of multiple regions: Secondary | ICD-10-CM

## 2015-10-12 DIAGNOSIS — D509 Iron deficiency anemia, unspecified: Secondary | ICD-10-CM

## 2015-10-12 DIAGNOSIS — Z720 Tobacco use: Secondary | ICD-10-CM

## 2015-10-12 DIAGNOSIS — K8020 Calculus of gallbladder without cholecystitis without obstruction: Secondary | ICD-10-CM

## 2015-10-12 LAB — CBC WITH DIFFERENTIAL/PLATELET
BASO%: 0.3 % (ref 0.0–2.0)
Basophils Absolute: 0 10*3/uL (ref 0.0–0.1)
EOS ABS: 0.1 10*3/uL (ref 0.0–0.5)
EOS%: 1.5 % (ref 0.0–7.0)
HCT: 33.3 % — ABNORMAL LOW (ref 34.8–46.6)
HEMOGLOBIN: 11.2 g/dL — AB (ref 11.6–15.9)
LYMPH#: 0.8 10*3/uL — AB (ref 0.9–3.3)
LYMPH%: 19.9 % (ref 14.0–49.7)
MCH: 28.6 pg (ref 25.1–34.0)
MCHC: 33.5 g/dL (ref 31.5–36.0)
MCV: 85.5 fL (ref 79.5–101.0)
MONO#: 0.6 10*3/uL (ref 0.1–0.9)
MONO%: 14.1 % — ABNORMAL HIGH (ref 0.0–14.0)
NEUT%: 64.2 % (ref 38.4–76.8)
NEUTROS ABS: 2.6 10*3/uL (ref 1.5–6.5)
PLATELETS: 257 10*3/uL (ref 145–400)
RBC: 3.9 10*6/uL (ref 3.70–5.45)
RDW: 19.3 % — AB (ref 11.2–14.5)
WBC: 4 10*3/uL (ref 3.9–10.3)

## 2015-10-12 LAB — COMPREHENSIVE METABOLIC PANEL (CC13)
ALT: 60 U/L — AB (ref 0–55)
ANION GAP: 7 meq/L (ref 3–11)
AST: 33 U/L (ref 5–34)
Albumin: 3.4 g/dL — ABNORMAL LOW (ref 3.5–5.0)
Alkaline Phosphatase: 93 U/L (ref 40–150)
BUN: 13.2 mg/dL (ref 7.0–26.0)
CALCIUM: 10.3 mg/dL (ref 8.4–10.4)
CO2: 27 meq/L (ref 22–29)
CREATININE: 0.7 mg/dL (ref 0.6–1.1)
Chloride: 102 mEq/L (ref 98–109)
Glucose: 85 mg/dl (ref 70–140)
Potassium: 4.3 mEq/L (ref 3.5–5.1)
Sodium: 135 mEq/L — ABNORMAL LOW (ref 136–145)
TOTAL PROTEIN: 7.4 g/dL (ref 6.4–8.3)

## 2015-10-12 NOTE — Telephone Encounter (Signed)
Gave patient avs report and appointments for October and November.

## 2015-10-12 NOTE — Progress Notes (Signed)
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 She had acute, severe anaphylactic reaction to attempted taxol such that regimen was changed to CDDP gemzar, cycle 1 given 10-06-15 and planned every other week.  Patient tolerated first CDDP gemzar chemotherapy generally well, with just some manageable fatigue. She had no problems with prehydration for treatment and continues to push po fluids. She has had no nausea, no taste alterations, and bowels are moving regularly with miralax. She has had no vaginal bleeding since initial radiation, no other bleeding. She denies increased SOB. She has no peripheral neuropathy. She did not seem to have fever or aches from gemzar.  Tho we were considering avastin with carbo taxol, have not made a decision on that with CDDP gemzar, particularly with length of infusions.  No PAC Flu vaccine done 09-01-15  ONCOLOGIC HISTORY Patient has had no regular medical care in years, including no PAP in >15 years. She had no bleeding since menopause until several hours of heavy vaginal bleeding in 04-2015, which resolved other than intermittent spotting until extremely heavy vaginal bleeding again 08-08-15. She was seen in [facility] ED then, with Hgb 8.0; she was given premarin and continued on oral megace x 2 weeks. She was seen by Dr Mallory Shirk on 08-16-15 , with finding of large fungating cervical mass with parametrial involvement. Cervical biopsy 08-17-15 ([id number]) showed moderately to poorly differentiated squamous cell carcinoma. She had CT CAP in [facility] system on 08-21-15, with 1.7 cm oval mass upper outer right breast, multiple prominent mediastinal lymph nodes and multiiple  bilateral pulmonary nodules including a 2.7 x 1.8 cm nodule in RLL., retroperitoneal and external iliac adenopathy, 4.2 x 5.2 x 6.1 cm mass involving cervix and lower uterine segment, 1 cm sclerotic lesion in lateral left fifth rib, liver ok and no ureteral obstruction. She saw Dr Alycia Rossetti on 08-23-15, who agreed with diagnosis of cervical cancer at least IIIB, possibly IV if pulmonary mets. PET pending, recommendation if stage IV to use short course RT to control bleeding then carbo taxol +/- avastin; if PET shows pelvis only then radiation with sensitizing CDDP. Patient saw Dr Sondra Come in consultation on 08-30-15, with vaginal packing required for heavy bleeding. She had urgent radiation thru 09-04-15. PET 09-06-15 had uptake in cervical mass, periaortic and iliac nodes, left supraclavicular node, left subpectoral and axillary nodes, and in right breast mass. Biposy of right breast 09-08-15 benign. Carboplatin taxol was attempted on 09-22-15, with acute, severe taxol reaction after ~ 2 ml taxol had infused, this despite full oral and IV premedication for taxol. Regimen was changed to CDDP gemzar, cycle 1 given 10-06-15.    Review of systems as above, also: No increased SOB or cough. Only pain is intermittent pelvic cramping, which is not severe. Voiding well. No LE swelling. Tolerating oral iron without difficulty Remainder of 10 point Review of Systems negative.  Objective:  Vital signs in last 24 hours:  BP 154/63 mmHg  Pulse 91  Temp(Src) 98.2 F (36.8 C) (Oral)  Resp 18  Ht '5\' 4"'$  (1.626 m)  Wt 141 lb 6.4 oz (64.139 kg)  BMI 24.26 kg/m2  SpO2 100% Weight up 4 lbs, which may be with better hydration Alert, oriented and appropriate. Ambulatory without difficulty.  No alopecia  HEENT:PERRL, sclerae not icteric. Oral mucosa moist without lesions, posterior pharynx clear.  Neck supple. No JVD.  Lymphatics:no cervical,supraclavicular, axillary or inguinal adenopathy Resp: somewhat diminished BS  bilaterally otherwise clear to auscultation bilaterally and no dullness to percussion bilaterally Cardio: regular rate and rhythm. No gallop. GI: soft, nontender, not distended, no mass or organomegaly. Normally active bowel sounds.  Musculoskeletal/ Extremities: without pitting edema, cords, tenderness Neuro: no peripheral neuropathy. Otherwise nonfocal. PSYCH appropriate mood and affect Skin without rash, ecchymosis, petechiae  Lab Results:  Results for orders placed or performed in visit on 10/12/15  CBC with Differential  Result Value Ref Range   WBC 4.0 3.9 - 10.3 10e3/uL   NEUT# 2.6 1.5 - 6.5 10e3/uL   HGB 11.2 (L) 11.6 - 15.9 g/dL   HCT 33.3 (L) 34.8 - 46.6 %   Platelets 257 145 - 400 10e3/uL   MCV 85.5 79.5 - 101.0 fL   MCH 28.6 25.1 - 34.0 pg   MCHC 33.5 31.5 - 36.0 g/dL   RBC 3.90 3.70 - 5.45 10e6/uL   RDW 19.3 (H) 11.2 - 14.5 %   lymph# 0.8 (L) 0.9 - 3.3 10e3/uL   MONO# 0.6 0.1 - 0.9 10e3/uL   Eosinophils Absolute 0.1 0.0 - 0.5 10e3/uL   Basophils Absolute 0.0 0.0 - 0.1 10e3/uL   NEUT% 64.2 38.4 - 76.8 %   LYMPH% 19.9 14.0 - 49.7 %   MONO% 14.1 (H) 0.0 - 14.0 %   EOS% 1.5 0.0 - 7.0 %   BASO% 0.3 0.0 - 2.0 %   CMET available after visit today normal with the exception of NA 135, ALT 60, alb 3.4  Studies/Results:  No results found.  Medications: I have reviewed the patient's current medications. Continue oral ferrous fumarate  DISCUSSION: patient is in agreement with continuing treatment as planned, every other week. She will see Dr Sondra Come on 10-26, so will have CBC CMET Mg that day and we will let her know if ok for treatment with cycle 2 CDDP gemzar on 10-19-15. I will see her 11-10 with lab and Rx same day if possible. She has preferred treatment in [place].  Assessment/Plan:  1. 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 Bleeding resolved since short course  palliative radiation. With anaphylaxis to taxol,  regimen changed to CDDP gemzar, begun 10-06-15. Avastin may give minimal, short term additional benefit to chemo, with additional infusion time and other possible complications, not added yet. 2.acute anaphylactic reaction to taxol 09-22-15 despite full premedication 3.long and ongoing tobacco, which she is now actively tryiing to stop.  4..iron deficiency anemia by iron studies Sept, with recent heavy vaginal bleeding: Transfused 2 units PRBCs + '510mg'$  feraheme on 09-11-15. Will change oral iron to Hemocyte/ ferrous fumarate and follow.Hemoglobin back in normal range now 5..right breast mass upper outer quadrant: biopsy with benign findings, 6 month follow up diagnostic mammogram + [place] recommended  6. Never colonoscopy 7.post carpal tunnel release 8.sclerotic rib lesion on CT no correlate on PET 9.CAD by CT 10.cholelithiasis by CT 11.abnormal enhancement right renal collecting system by CT 12.peripheral venous access easier than anticipated, and she prefers this to [facility] 13.Lives in [place]. May be able to do gCSF if needed at [facility] She understands that medical oncology care could also transfer there if she prefers. 14.flu vaccine given 09-01-15   All questions answered. Chemo orders confirmed.Message to RN to follow  up labs on 10-26 and let patient know results. Time spent 25 min including >50% counseling and coordination of care.    LIVESAY,LENNIS P, MD   10/12/2015, 10:35 AM

## 2015-10-15 ENCOUNTER — Other Ambulatory Visit: Payer: Self-pay | Admitting: Oncology

## 2015-10-18 ENCOUNTER — Encounter: Payer: Self-pay | Admitting: Gynecologic Oncology

## 2015-10-18 ENCOUNTER — Telehealth: Payer: Self-pay | Admitting: *Deleted

## 2015-10-18 ENCOUNTER — Other Ambulatory Visit: Payer: Self-pay | Admitting: Oncology

## 2015-10-18 ENCOUNTER — Ambulatory Visit: Payer: BLUE CROSS/BLUE SHIELD | Admitting: Radiation Oncology

## 2015-10-18 ENCOUNTER — Other Ambulatory Visit: Payer: Self-pay | Admitting: Gynecologic Oncology

## 2015-10-18 ENCOUNTER — Other Ambulatory Visit (HOSPITAL_BASED_OUTPATIENT_CLINIC_OR_DEPARTMENT_OTHER): Payer: BLUE CROSS/BLUE SHIELD

## 2015-10-18 DIAGNOSIS — C539 Malignant neoplasm of cervix uteri, unspecified: Secondary | ICD-10-CM

## 2015-10-18 DIAGNOSIS — R918 Other nonspecific abnormal finding of lung field: Secondary | ICD-10-CM

## 2015-10-18 LAB — MAGNESIUM (CC13): Magnesium: 2.1 mg/dl (ref 1.5–2.5)

## 2015-10-18 LAB — COMPREHENSIVE METABOLIC PANEL (CC13)
ALBUMIN: 3.6 g/dL (ref 3.5–5.0)
ALK PHOS: 94 U/L (ref 40–150)
ALT: 39 U/L (ref 0–55)
AST: 23 U/L (ref 5–34)
Anion Gap: 10 mEq/L (ref 3–11)
BUN: 14.4 mg/dL (ref 7.0–26.0)
CO2: 23 meq/L (ref 22–29)
Calcium: 9.8 mg/dL (ref 8.4–10.4)
Chloride: 100 mEq/L (ref 98–109)
Creatinine: 0.7 mg/dL (ref 0.6–1.1)
EGFR: >90 mL/min/{1.73_m2} (ref >90)
GLUCOSE: 91 mg/dL (ref 70–140)
POTASSIUM: 4 meq/L (ref 3.5–5.1)
SODIUM: 134 meq/L — AB (ref 136–145)
TOTAL PROTEIN: 7.4 g/dL (ref 6.4–8.3)

## 2015-10-18 LAB — CBC WITH DIFFERENTIAL/PLATELET
BASO%: 0.1 % (ref 0.0–2.0)
BASOS ABS: 0 10*3/uL (ref 0.0–0.1)
EOS ABS: 0.1 10*3/uL (ref 0.0–0.5)
EOS%: 1.8 % (ref 0.0–7.0)
HEMATOCRIT: 34.6 % — AB (ref 34.8–46.6)
HEMOGLOBIN: 11.2 g/dL — AB (ref 11.6–15.9)
LYMPH#: 1.3 10*3/uL (ref 0.9–3.3)
LYMPH%: 18.5 % (ref 14.0–49.7)
MCH: 28.4 pg (ref 25.1–34.0)
MCHC: 32.4 g/dL (ref 31.5–36.0)
MCV: 87.6 fL (ref 79.5–101.0)
MONO#: 0.9 10*3/uL (ref 0.1–0.9)
MONO%: 12 % (ref 0.0–14.0)
NEUT%: 67.6 % (ref 38.4–76.8)
NEUTROS ABS: 4.9 10*3/uL (ref 1.5–6.5)
Platelets: 270 10*3/uL (ref 145–400)
RBC: 3.95 10*6/uL (ref 3.70–5.45)
RDW: 17.5 % — AB (ref 11.2–14.5)
WBC: 7.3 10*3/uL (ref 3.9–10.3)

## 2015-10-18 NOTE — Telephone Encounter (Signed)
Pt notified labs good for treatment 10/27

## 2015-10-18 NOTE — Telephone Encounter (Signed)
-----   Message from Gordy Levan, MD sent at 10/12/2015 10:33 AM EDT ----- Will have labs 10-26 when she comes to see Dr Sondra Come. Need to let her know if ok for Rx with CDDP gem on 10-27

## 2015-10-18 NOTE — Telephone Encounter (Signed)
Notified pt of future appointment . Pt is scheduled at [facility] Oct 24, 2015  For a CT scan  @ 1:45. Pt was advised to arrive 15 min early and nothing to eat or drink 4 hours prior to her scan. Pt agreed with appointment details and instructions

## 2015-10-18 NOTE — Progress Notes (Signed)
Gynecologic Oncology Multi-Disciplinary Disposition Conference Note  Date of the Conference: 10/16/15  Patient Name: Valerie Figueroa  Referring Provider: Dr. Mallory Shirk Primary GYN Oncologist: Dr. Nancy Marus  Stage/Disposition:  Stage IIIB Cervical Cancer.  Disposition is to repeat chest CT now to evaluate axillary and supraclavicular regions for metastatic disease.  If negative, plan for curative intent radiation with cisplatin.  Repeat PET scan in six weeks after completion of radiation to evaluate chest disease and pelvic response.   This Multidisciplinary conference took place involving physicians from [place], [place], Radiation Oncology, Pathology, Radiology along with the Gynecologic Oncology Nurse Practitioner and RN.  Comprehensive assessment of the patient's malignancy, staging, need for surgery, chemotherapy, radiation therapy, and need for further testing were reviewed. Supportive measures, both inpatient and following discharge were also discussed. The recommended plan of care is documented. Greater than 35 minutes were spent correlating and coordinating this patient's care.

## 2015-10-19 ENCOUNTER — Encounter: Payer: Self-pay | Admitting: Radiation Oncology

## 2015-10-19 ENCOUNTER — Ambulatory Visit (HOSPITAL_BASED_OUTPATIENT_CLINIC_OR_DEPARTMENT_OTHER): Payer: BLUE CROSS/BLUE SHIELD

## 2015-10-19 VITALS — BP 160/75 | HR 89 | Temp 98.2°F | Resp 20

## 2015-10-19 DIAGNOSIS — Z5111 Encounter for antineoplastic chemotherapy: Secondary | ICD-10-CM

## 2015-10-19 DIAGNOSIS — C778 Secondary and unspecified malignant neoplasm of lymph nodes of multiple regions: Secondary | ICD-10-CM | POA: Diagnosis not present

## 2015-10-19 DIAGNOSIS — C531 Malignant neoplasm of exocervix: Secondary | ICD-10-CM

## 2015-10-19 DIAGNOSIS — C539 Malignant neoplasm of cervix uteri, unspecified: Secondary | ICD-10-CM | POA: Diagnosis not present

## 2015-10-19 MED ORDER — PALONOSETRON HCL INJECTION 0.25 MG/5ML
INTRAVENOUS | Status: AC
  Filled 2015-10-19: qty 5

## 2015-10-19 MED ORDER — SODIUM CHLORIDE 0.9 % IV SOLN
600.0000 mg/m2 | Freq: Once | INTRAVENOUS | Status: AC
  Administered 2015-10-19: 1026 mg via INTRAVENOUS
  Filled 2015-10-19: qty 26.98

## 2015-10-19 MED ORDER — SODIUM CHLORIDE 0.9 % IV SOLN
Freq: Once | INTRAVENOUS | Status: AC
  Administered 2015-10-19: 11:00:00 via INTRAVENOUS
  Filled 2015-10-19: qty 5

## 2015-10-19 MED ORDER — PALONOSETRON HCL INJECTION 0.25 MG/5ML
0.2500 mg | Freq: Once | INTRAVENOUS | Status: AC
  Administered 2015-10-19: 0.25 mg via INTRAVENOUS

## 2015-10-19 MED ORDER — SODIUM CHLORIDE 0.9 % IV SOLN
30.0000 mg/m2 | Freq: Once | INTRAVENOUS | Status: AC
  Administered 2015-10-19: 50 mg via INTRAVENOUS
  Filled 2015-10-19: qty 50

## 2015-10-19 MED ORDER — SODIUM CHLORIDE 0.9 % IV SOLN
Freq: Once | INTRAVENOUS | Status: AC
  Administered 2015-10-19: 11:00:00 via INTRAVENOUS

## 2015-10-19 MED ORDER — POTASSIUM CHLORIDE 2 MEQ/ML IV SOLN
Freq: Once | INTRAVENOUS | Status: AC
  Administered 2015-10-19: 10:00:00 via INTRAVENOUS
  Filled 2015-10-19: qty 10

## 2015-10-19 NOTE — Progress Notes (Signed)
  Radiation Oncology         [phone] ________________________________  Name: Valerie Figueroa MRN: [id number]  Date: 10/19/2015  DOB: 12-14-1956  Progress  Note  Diagnosis: FIGO Stage IVB (T2a2, N1, M1) squamous cell carcinoma cervix, possibly stage IIIB  NARRATIVE: This evening I discussed the gynecologic oncology conference recommendations concerning the patient's management with the patient.  We discussed the unusual findings of axillary metastasis on her PET scan. Consensus from the conference was that the patient proceed with a chest CT scan for further evaluation of her axillary adenopathy. Depending on results of this study and if inconclusive documentation of stage IV disease then the patient will proceed with a curative course of radiation along with radiosensitizing chemotherapy.  Next week the patient will follow up with me concerning her short course of pelvic radiation therapy (for bleeding issues) and I will review the patient's films to give her a better understanding of the controversy at hand. -----------------------------------  Blair Promise, PhD, MD

## 2015-10-19 NOTE — Patient Instructions (Signed)
[facility] Discharge Instructions for Patients Receiving Chemotherapy  Today you received the following chemotherapy agents gemzar/cisplatin  To help prevent nausea and vomiting after your treatment, we encourage you to take your nausea medication as directed   If you develop nausea and vomiting that is not controlled by your nausea medication, call the clinic.   BELOW ARE SYMPTOMS THAT SHOULD BE REPORTED IMMEDIATELY:  *FEVER GREATER THAN 100.5 F  *CHILLS WITH OR WITHOUT FEVER  NAUSEA AND VOMITING THAT IS NOT CONTROLLED WITH YOUR NAUSEA MEDICATION  *UNUSUAL SHORTNESS OF BREATH  *UNUSUAL BRUISING OR BLEEDING  TENDERNESS IN MOUTH AND THROAT WITH OR WITHOUT PRESENCE OF ULCERS  *URINARY PROBLEMS  *BOWEL PROBLEMS  UNUSUAL RASH Items with * indicate a potential emergency and should be followed up as soon as possible.  Gemcitabine injection What is this medicine? GEMCITABINE (jem SIT a been) is a chemotherapy drug. This medicine is used to treat many types of cancer like breast cancer, lung cancer, pancreatic cancer, and ovarian cancer. This medicine may be used for other purposes; ask your health care provider or pharmacist if you have questions. What should I tell my health care provider before I take this medicine? They need to know if you have any of these conditions: -blood disorders -infection -kidney disease -liver disease -recent or ongoing radiation therapy -an unusual or allergic reaction to gemcitabine, other chemotherapy, other medicines, foods, dyes, or preservatives -pregnant or trying to get pregnant -breast-feeding How should I use this medicine? This drug is given as an infusion into a vein. It is administered in a hospital or clinic by a specially trained health care professional. Talk to your pediatrician regarding the use of this medicine in children. Special care may be needed. Overdosage: If you think you have taken too much of this  medicine contact a poison control center or emergency room at once. NOTE: This medicine is only for you. Do not share this medicine with others. What if I miss a dose? It is important not to miss your dose. Call your doctor or health care professional if you are unable to keep an appointment. What may interact with this medicine? -medicines to increase blood counts like filgrastim, pegfilgrastim, sargramostim -some other chemotherapy drugs like cisplatin -vaccines Talk to your doctor or health care professional before taking any of these medicines: -acetaminophen -aspirin -ibuprofen -ketoprofen -naproxen This list may not describe all possible interactions. Give your health care provider a list of all the medicines, herbs, non-prescription drugs, or dietary supplements you use. Also tell them if you smoke, drink alcohol, or use illegal drugs. Some items may interact with your medicine. What should I watch for while using this medicine? Visit your doctor for checks on your progress. This drug may make you feel generally unwell. This is not uncommon, as chemotherapy can affect healthy cells as well as cancer cells. Report any side effects. Continue your course of treatment even though you feel ill unless your doctor tells you to stop. In some cases, you may be given additional medicines to help with side effects. Follow all directions for their use. Call your doctor or health care professional for advice if you get a fever, chills or sore throat, or other symptoms of a cold or flu. Do not treat yourself. This drug decreases your body's ability to fight infections. Try to avoid being around people who are sick. This medicine may increase your risk to bruise or bleed. Call your doctor or health care professional if  you notice any unusual bleeding. Be careful brushing and flossing your teeth or using a toothpick because you may get an infection or bleed more easily. If you have any dental work done,  tell your dentist you are receiving this medicine. Avoid taking products that contain aspirin, acetaminophen, ibuprofen, naproxen, or ketoprofen unless instructed by your doctor. These medicines may hide a fever. Women should inform their doctor if they wish to become pregnant or think they might be pregnant. There is a potential for serious side effects to an unborn child. Talk to your health care professional or pharmacist for more information. Do not breast-feed an infant while taking this medicine. What side effects may I notice from receiving this medicine? Side effects that you should report to your doctor or health care professional as soon as possible: -allergic reactions like skin rash, itching or hives, swelling of the face, lips, or tongue -low blood counts - this medicine may decrease the number of white blood cells, red blood cells and platelets. You may be at increased risk for infections and bleeding. -signs of infection - fever or chills, cough, sore throat, pain or difficulty passing urine -signs of decreased platelets or bleeding - bruising, pinpoint red spots on the skin, black, tarry stools, blood in the urine -signs of decreased red blood cells - unusually weak or tired, fainting spells, lightheadedness -breathing problems -chest pain -mouth sores -nausea and vomiting -pain, swelling, redness at site where injected -pain, tingling, numbness in the hands or feet -stomach pain -swelling of ankles, feet, hands -unusual bleeding Side effects that usually do not require medical attention (report to your doctor or health care professional if they continue or are bothersome): -constipation -diarrhea -hair loss -loss of appetite -stomach upset This list may not describe all possible side effects. Call your doctor for medical advice about side effects. You may report side effects to FDA at 1-800-FDA-1088. Where should I keep my medicine? This drug is given in a hospital or  clinic and will not be stored at home. NOTE: This sheet is a summary. It may not cover all possible information. If you have questions about this medicine, talk to your doctor, pharmacist, or health care provider.    2016, Elsevier/Gold Standard. (2008-04-19 18:45:54)   Feel free to call the clinic you have any questions or concerns. The clinic phone number is [phone].  Cisplatin injection What is this medicine? CISPLATIN (SIS pla tin) is a chemotherapy drug. It targets fast dividing cells, like cancer cells, and causes these cells to die. This medicine is used to treat many types of cancer like bladder, ovarian, and testicular cancers. This medicine may be used for other purposes; ask your health care provider or pharmacist if you have questions. What should I tell my health care provider before I take this medicine? They need to know if you have any of these conditions: -blood disorders -hearing problems -kidney disease -recent or ongoing radiation therapy -an unusual or allergic reaction to cisplatin, carboplatin, other chemotherapy, other medicines, foods, dyes, or preservatives -pregnant or trying to get pregnant -breast-feeding How should I use this medicine? This drug is given as an infusion into a vein. It is administered in a hospital or clinic by a specially trained health care professional. Talk to your pediatrician regarding the use of this medicine in children. Special care may be needed. Overdosage: If you think you have taken too much of this medicine contact a poison control center or emergency room at once. NOTE:  This medicine is only for you. Do not share this medicine with others. What if I miss a dose? It is important not to miss a dose. Call your doctor or health care professional if you are unable to keep an appointment. What may interact with this medicine? -dofetilide -foscarnet -medicines for seizures -medicines to increase blood counts like  filgrastim, pegfilgrastim, sargramostim -probenecid -pyridoxine used with altretamine -rituximab -some antibiotics like amikacin, gentamicin, neomycin, polymyxin B, streptomycin, tobramycin -sulfinpyrazone -vaccines -zalcitabine Talk to your doctor or health care professional before taking any of these medicines: -acetaminophen -aspirin -ibuprofen -ketoprofen -naproxen This list may not describe all possible interactions. Give your health care provider a list of all the medicines, herbs, non-prescription drugs, or dietary supplements you use. Also tell them if you smoke, drink alcohol, or use illegal drugs. Some items may interact with your medicine. What should I watch for while using this medicine? Your condition will be monitored carefully while you are receiving this medicine. You will need important blood work done while you are taking this medicine. This drug may make you feel generally unwell. This is not uncommon, as chemotherapy can affect healthy cells as well as cancer cells. Report any side effects. Continue your course of treatment even though you feel ill unless your doctor tells you to stop. In some cases, you may be given additional medicines to help with side effects. Follow all directions for their use. Call your doctor or health care professional for advice if you get a fever, chills or sore throat, or other symptoms of a cold or flu. Do not treat yourself. This drug decreases your body's ability to fight infections. Try to avoid being around people who are sick. This medicine may increase your risk to bruise or bleed. Call your doctor or health care professional if you notice any unusual bleeding. Be careful brushing and flossing your teeth or using a toothpick because you may get an infection or bleed more easily. If you have any dental work done, tell your dentist you are receiving this medicine. Avoid taking products that contain aspirin, acetaminophen, ibuprofen,  naproxen, or ketoprofen unless instructed by your doctor. These medicines may hide a fever. Do not become pregnant while taking this medicine. Women should inform their doctor if they wish to become pregnant or think they might be pregnant. There is a potential for serious side effects to an unborn child. Talk to your health care professional or pharmacist for more information. Do not breast-feed an infant while taking this medicine. Drink fluids as directed while you are taking this medicine. This will help protect your kidneys. Call your doctor or health care professional if you get diarrhea. Do not treat yourself. What side effects may I notice from receiving this medicine? Side effects that you should report to your doctor or health care professional as soon as possible: -allergic reactions like skin rash, itching or hives, swelling of the face, lips, or tongue -signs of infection - fever or chills, cough, sore throat, pain or difficulty passing urine -signs of decreased platelets or bleeding - bruising, pinpoint red spots on the skin, black, tarry stools, nosebleeds -signs of decreased red blood cells - unusually weak or tired, fainting spells, lightheadedness -breathing problems -changes in hearing -gout pain -low blood counts - This drug may decrease the number of white blood cells, red blood cells and platelets. You may be at increased risk for infections and bleeding. -nausea and vomiting -pain, swelling, redness or irritation at the injection site -  pain, tingling, numbness in the hands or feet -problems with balance, movement -trouble passing urine or change in the amount of urine Side effects that usually do not require medical attention (report to your doctor or health care professional if they continue or are bothersome): -changes in vision -loss of appetite -metallic taste in the mouth or changes in taste This list may not describe all possible side effects. Call your doctor for  medical advice about side effects. You may report side effects to FDA at 1-800-FDA-1088. Where should I keep my medicine? This drug is given in a hospital or clinic and will not be stored at home. NOTE: This sheet is a summary. It may not cover all possible information. If you have questions about this medicine, talk to your doctor, pharmacist, or health care provider.    2016, Elsevier/Gold Standard. (2008-03-15 14:40:54)

## 2015-10-23 ENCOUNTER — Encounter: Payer: Self-pay | Admitting: Oncology

## 2015-10-24 ENCOUNTER — Ambulatory Visit (HOSPITAL_COMMUNITY)
Admission: RE | Admit: 2015-10-24 | Discharge: 2015-10-24 | Disposition: A | Discharge status: Home / Self Care | Payer: BLUE CROSS/BLUE SHIELD | Source: Ambulatory Visit | Attending: Gynecologic Oncology | Admitting: Gynecologic Oncology

## 2015-10-24 DIAGNOSIS — R918 Other nonspecific abnormal finding of lung field: Secondary | ICD-10-CM | POA: Insufficient documentation

## 2015-10-24 DIAGNOSIS — C539 Malignant neoplasm of cervix uteri, unspecified: Secondary | ICD-10-CM | POA: Insufficient documentation

## 2015-10-24 MED ORDER — IOHEXOL 300 MG/ML  SOLN
75.0000 mL | Freq: Once | INTRAMUSCULAR | Status: AC | PRN
  Administered 2015-10-24: 75 mL via INTRAVENOUS

## 2015-10-25 ENCOUNTER — Encounter: Payer: Self-pay | Admitting: Radiation Oncology

## 2015-10-25 ENCOUNTER — Ambulatory Visit
Admission: RE | Admit: 2015-10-25 | Discharge: 2015-10-25 | Disposition: A | Discharge status: Home / Self Care | Payer: BLUE CROSS/BLUE SHIELD | Source: Ambulatory Visit | Attending: Radiation Oncology | Admitting: Radiation Oncology

## 2015-10-25 VITALS — BP 149/63 | HR 91 | Temp 98.0°F | Resp 20 | Ht 64.0 in | Wt 146.3 lb

## 2015-10-25 DIAGNOSIS — C539 Malignant neoplasm of cervix uteri, unspecified: Secondary | ICD-10-CM

## 2015-10-25 NOTE — Progress Notes (Signed)
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  Follow up s/p. Rad UXL:[id number] 08/31/15-09/04/15 9 Gy 3 fx for severe vaginal bleeding. Spotting dark brown/red 10/17/15-10/20/15, this has resolved. She continues to wear a pad just in case she has any more vaginal bleeding. Denies severe back pain, experiencing only mild cramping. Denies dysuria. Regular bowel movements and good appetite. Last chemotherapy infusion 10/19/15. Her next due Chemo infusion 11/02/15 with follow up Dr. Marko Plume. CT chest done 10/24/15, results in. Denies pain, energy level down from chemotherapy.   Patient's case was presented at the multidisciplinary GYN/ONC conference last week and the controversy concerning her axillary nodes and supraclavicular nodes were discussed. It was recommended that the patient proceed with a chest CT scan which was performed late last week. This chest CT scan was more consistent with inflammatory/reactive lymphadenopathy in the axillary region and therefore appears that the patient has IIIB disease and not stage IV. It was recommended that the patient proceed with a curative course of radiation and radiosensitive chemotherapy.  Patient is here to discuss her chest x-ray findings and discuss further recommendations for treatment.  ALLERGIES:  is allergic to paclitaxel.  Meds: Current Outpatient Prescriptions  Medication Sig Dispense Refill  . acetaminophen (TYLENOL) 500 MG tablet Take 1,000 mg by mouth every 8 (eight) hours as needed.    Marland Kitchen albuterol (PROVENTIL HFA;VENTOLIN HFA) 108 (90 BASE) MCG/ACT inhaler Inhale 2 puffs into the  lungs every 6 (six) hours as needed for wheezing or shortness of breath. 1 Inhaler 2  . Fe Fum-FA-B Cmp-C-Zn-Mg-Mn-Cu (CENTRATEX) 106-1 MG CAPS Take 1 capsule by mouth daily.    Marland Kitchen LORazepam (ATIVAN) 0.5 MG tablet Place 1 tablet under the tongue or swallow every 6 hours as needed for nausea. Will make drowsy. 20 tablet 0  . Multiple Vitamins-Minerals (CENTRUM ULTRA WOMENS) TABS Take 1 tablet by mouth every morning.    . ondansetron (ZOFRAN) 8 MG tablet Take 1 tablet (8 mg total) by mouth every 8 (eight) hours as needed for nausea or vomiting. 30 tablet 1  . polyethylene glycol (MIRALAX / GLYCOLAX) packet Take 17 g by mouth daily.    . traMADol (ULTRAM) 50 MG tablet Take 1 tablet (50 mg total) by mouth every 8 (eight) hours as needed. 20 tablet 0   No current facility-administered medications for this encounter.    Physical Findings: The patient is in no acute distress. Patient is alert and oriented.  height is '5\' 4"'$  (1.626 m) and weight is 146 lb 4.8 oz (66.361 kg). Her oral temperature is 98 F (36.7 C). Her blood pressure is 149/63 and her pulse is 91. Her respiration is 20 and oxygen saturation is 99%. Marland Kitchen  Speculum exam was performed there is no active bleeding. By manual exam the cervical mass appears to be smaller compared to my previous exam.   Lab Findings: Lab Results  Component Value Date   WBC 7.3 10/18/2015   HGB 11.2* 10/18/2015   HCT 34.6* 10/18/2015   MCV 87.6 10/18/2015   PLT 270 10/18/2015    Radiographic Findings: Ct Chest W Contrast  10/24/2015  CLINICAL  DATA:  Cervical carcinoma. Followup hypermetabolic left axillary and supraclavicular lymph nodes seen on recent PET-CT. EXAM: CT CHEST WITH CONTRAST TECHNIQUE: Multidetector CT imaging of the chest was performed during intravenous contrast administration. CONTRAST:  69m OMNIPAQUE IOHEXOL 300 MG/ML  SOLN COMPARISON:  PET-CT on 09/06/2015 FINDINGS: Mediastinum/Lymph Nodes: No masses or pathologically enlarged lymph nodes  identified. Sub-cm left axillary lymph nodes are minimally decreased in prominence since previous study, suggesting resolving reactive or inflammatory etiology. Tiny sub-cm left supraclavicular lymph nodes remain stable and are not pathologically enlarged. No hilar or mediastinal lymphadenopathy identified. Lungs/Pleura: No pulmonary infiltrate or mass identified. No effusion present. Mild right lower lobe scarring is stable. Mild emphysema again noted. Musculoskeletal/Soft Tissues: No suspicious bone lesions or other significant chest wall abnormality. 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 Upper Abdomen: Cholelithiasis again noted, without evidence of cholecystitis. IMPRESSION: Minimally decreased prominence of sub-cm left axillary lymph nodes, and stable sub-cm left supraclavicular lymph nodes. This suggests a resolving reactive or inflammatory etiology, with metastatic disease considered less likely. No definite evidence of metastatic disease or other acute findings within the thorax. Electronically Signed   By: JEarle GellM.D.   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 The patient is a good candidate for curative course of  radiation and radiosensitive chemotherapy, including intracavitary implants. Patient has an excellent performance status. I discussed the course of curative radiation, side effects, and potential complications in detail. She wishes to proceed with this therapy.  Plan:  The patient has been scheduled for simulation and treatment planning, November 8th at 12:15 pm. Patient will likely receive radiosensitizing cisplatin-based chemotherapy during her external beam treatments -----------------------------------  JBlair Promise PhD, MD  This document serves as a record of services personally performed by JGery Pray MD. It was created on his behalf by  EArlyce Harman a trained medical scribe. The creation of this record is based on the scribe's personal observations and the provider's statements to them. This document has been checked and approved by the attending provider.

## 2015-10-25 NOTE — Progress Notes (Signed)
Follow up s/p  Tad NID:[id number] 08/31/15-09/04/15 9Gy 3 fx Spotting dark brown/red 10/17/15-10/20/15, has resolved, no no dysuria, regular bowel movements, last chemotherapy infusion 10/19/15, next due  Chemo infusion 11/02/15 with follow up Dr. Marko Plume, Ct chest done 10/24/15, results in, appetite  Good, no pain, energy level down from chmotherapy 8:24 AM BP 149/63 mmHg  Pulse 91  Temp(Src) 98 F (36.7 C) (Oral)  Resp 20  Ht '5\' 4"'$  (1.626 m)  Wt 146 lb 4.8 oz (66.361 kg)  BMI 25.10 kg/m2  SpO2 99%  Wt Readings from Last 3 Encounters:  10/25/15 146 lb 4.8 oz (66.361 kg)  10/12/15 141 lb 6.4 oz (64.139 kg)  09/28/15 137 lb 6.4 oz (62.324 kg)

## 2015-10-31 ENCOUNTER — Ambulatory Visit
Admission: RE | Admit: 2015-10-31 | Discharge: 2015-10-31 | Disposition: A | Discharge status: Home / Self Care | Payer: BLUE CROSS/BLUE SHIELD | Source: Ambulatory Visit | Attending: Radiation Oncology | Admitting: Radiation Oncology

## 2015-10-31 VITALS — BP 161/79 | HR 90 | Temp 98.4°F | Resp 18 | Ht 64.0 in | Wt 147.3 lb

## 2015-10-31 DIAGNOSIS — C539 Malignant neoplasm of cervix uteri, unspecified: Secondary | ICD-10-CM

## 2015-10-31 DIAGNOSIS — C538 Malignant neoplasm of overlapping sites of cervix uteri: Secondary | ICD-10-CM | POA: Diagnosis present

## 2015-10-31 DIAGNOSIS — Z510 Encounter for antineoplastic radiation therapy: Secondary | ICD-10-CM | POA: Diagnosis not present

## 2015-10-31 MED ORDER — SODIUM CHLORIDE 0.9 % IJ SOLN
10.0000 mL | Freq: Once | INTRAMUSCULAR | Status: AC
  Administered 2015-10-31: 10 mL via INTRAVENOUS

## 2015-10-31 NOTE — Progress Notes (Signed)
Patient is here for IV start for CT SIm.  She is not diabetic and denies having any allergies to IV contrast dye.  Her BUN was 14.4 and CR was 0.7 on labs from 10/18/15.  IV attempted in left AC.  #22 gauge IV started in left wrist.  Blood return noted and flushed with 10 cc normal saline.  Secured with Tegaderm and tape.

## 2015-10-31 NOTE — Progress Notes (Signed)
Left wrist IV removed intact.  Pressure and bandaid applied.

## 2015-11-01 ENCOUNTER — Other Ambulatory Visit: Payer: Self-pay | Admitting: Oncology

## 2015-11-01 ENCOUNTER — Telehealth: Payer: Self-pay | Admitting: Oncology

## 2015-11-01 DIAGNOSIS — C539 Malignant neoplasm of cervix uteri, unspecified: Secondary | ICD-10-CM

## 2015-11-01 NOTE — Telephone Encounter (Signed)
Appointments added per pof and a message was sent to [facility] to add 11/16 and patient will get a new avs at that time   anne

## 2015-11-02 ENCOUNTER — Encounter: Payer: Self-pay | Admitting: Oncology

## 2015-11-02 ENCOUNTER — Ambulatory Visit (HOSPITAL_BASED_OUTPATIENT_CLINIC_OR_DEPARTMENT_OTHER): Payer: BLUE CROSS/BLUE SHIELD

## 2015-11-02 ENCOUNTER — Ambulatory Visit (HOSPITAL_BASED_OUTPATIENT_CLINIC_OR_DEPARTMENT_OTHER): Payer: BLUE CROSS/BLUE SHIELD | Admitting: Oncology

## 2015-11-02 ENCOUNTER — Other Ambulatory Visit (HOSPITAL_BASED_OUTPATIENT_CLINIC_OR_DEPARTMENT_OTHER): Payer: BLUE CROSS/BLUE SHIELD

## 2015-11-02 VITALS — BP 163/73 | HR 81 | Temp 98.2°F | Resp 18 | Ht 64.0 in | Wt 149.1 lb

## 2015-11-02 DIAGNOSIS — Z5111 Encounter for antineoplastic chemotherapy: Secondary | ICD-10-CM

## 2015-11-02 DIAGNOSIS — C539 Malignant neoplasm of cervix uteri, unspecified: Secondary | ICD-10-CM

## 2015-11-02 DIAGNOSIS — N63 Unspecified lump in breast: Secondary | ICD-10-CM

## 2015-11-02 DIAGNOSIS — N6310 Unspecified lump in the right breast, unspecified quadrant: Secondary | ICD-10-CM

## 2015-11-02 DIAGNOSIS — C531 Malignant neoplasm of exocervix: Secondary | ICD-10-CM

## 2015-11-02 DIAGNOSIS — Z720 Tobacco use: Secondary | ICD-10-CM

## 2015-11-02 LAB — CBC WITH DIFFERENTIAL/PLATELET
BASO%: 0.2 % (ref 0.0–2.0)
Basophils Absolute: 0 10*3/uL (ref 0.0–0.1)
EOS%: 1.1 % (ref 0.0–7.0)
Eosinophils Absolute: 0.1 10*3/uL (ref 0.0–0.5)
HCT: 34.6 % — ABNORMAL LOW (ref 34.8–46.6)
HEMOGLOBIN: 11.4 g/dL — AB (ref 11.6–15.9)
LYMPH%: 21.6 % (ref 14.0–49.7)
MCH: 29.2 pg (ref 25.1–34.0)
MCHC: 32.9 g/dL (ref 31.5–36.0)
MCV: 88.7 fL (ref 79.5–101.0)
MONO#: 0.9 10*3/uL (ref 0.1–0.9)
MONO%: 14.1 % — AB (ref 0.0–14.0)
NEUT%: 63 % (ref 38.4–76.8)
NEUTROS ABS: 3.9 10*3/uL (ref 1.5–6.5)
PLATELETS: 175 10*3/uL (ref 145–400)
RBC: 3.9 10*6/uL (ref 3.70–5.45)
RDW: 18 % — AB (ref 11.2–14.5)
WBC: 6.2 10*3/uL (ref 3.9–10.3)
lymph#: 1.4 10*3/uL (ref 0.9–3.3)

## 2015-11-02 LAB — MAGNESIUM (CC13): Magnesium: 2.2 mg/dl (ref 1.5–2.5)

## 2015-11-02 LAB — COMPREHENSIVE METABOLIC PANEL (CC13)
ALBUMIN: 3.7 g/dL (ref 3.5–5.0)
ALT: 29 U/L (ref 0–55)
AST: 21 U/L (ref 5–34)
Alkaline Phosphatase: 85 U/L (ref 40–150)
Anion Gap: 11 mEq/L (ref 3–11)
BUN: 14.4 mg/dL (ref 7.0–26.0)
CO2: 21 meq/L — AB (ref 22–29)
CREATININE: 0.7 mg/dL (ref 0.6–1.1)
Calcium: 9.7 mg/dL (ref 8.4–10.4)
Chloride: 103 mEq/L (ref 98–109)
EGFR: >90 mL/min/{1.73_m2} (ref >90)
GLUCOSE: 80 mg/dL (ref 70–140)
Potassium: 4 mEq/L (ref 3.5–5.1)
SODIUM: 135 meq/L — AB (ref 136–145)
TOTAL PROTEIN: 7.2 g/dL (ref 6.4–8.3)

## 2015-11-02 MED ORDER — POTASSIUM CHLORIDE 2 MEQ/ML IV SOLN
Freq: Once | INTRAVENOUS | Status: AC
  Administered 2015-11-02: 09:00:00 via INTRAVENOUS
  Filled 2015-11-02: qty 10

## 2015-11-02 MED ORDER — PALONOSETRON HCL INJECTION 0.25 MG/5ML
INTRAVENOUS | Status: AC
  Filled 2015-11-02: qty 5

## 2015-11-02 MED ORDER — PALONOSETRON HCL INJECTION 0.25 MG/5ML
0.2500 mg | Freq: Once | INTRAVENOUS | Status: AC
Start: 2015-11-02 — End: 2015-11-02
  Administered 2015-11-02: 0.25 mg via INTRAVENOUS

## 2015-11-02 MED ORDER — SODIUM CHLORIDE 0.9 % IV SOLN
Freq: Once | INTRAVENOUS | Status: AC
  Administered 2015-11-02: 11:00:00 via INTRAVENOUS
  Filled 2015-11-02: qty 5

## 2015-11-02 MED ORDER — SODIUM CHLORIDE 0.9 % IV SOLN
Freq: Once | INTRAVENOUS | Status: AC
  Administered 2015-11-02: 09:00:00 via INTRAVENOUS

## 2015-11-02 MED ORDER — SODIUM CHLORIDE 0.9 % IV SOLN
50.0000 mg/m2 | Freq: Once | INTRAVENOUS | Status: AC
  Administered 2015-11-02: 84 mg via INTRAVENOUS
  Filled 2015-11-02: qty 84

## 2015-11-02 NOTE — Patient Instructions (Signed)
[facility] Discharge Instructions for Patients Receiving Chemotherapy  Today you received the following chemotherapy agents cisplatin  To help prevent nausea and vomiting after your treatment, we encourage you to take your nausea medication   If you develop nausea and vomiting that is not controlled by your nausea medication, call the clinic.   BELOW ARE SYMPTOMS THAT SHOULD BE REPORTED IMMEDIATELY:  *FEVER GREATER THAN 100.5 F  *CHILLS WITH OR WITHOUT FEVER  NAUSEA AND VOMITING THAT IS NOT CONTROLLED WITH YOUR NAUSEA MEDICATION  *UNUSUAL SHORTNESS OF BREATH  *UNUSUAL BRUISING OR BLEEDING  TENDERNESS IN MOUTH AND THROAT WITH OR WITHOUT PRESENCE OF ULCERS  *URINARY PROBLEMS  *BOWEL PROBLEMS  UNUSUAL RASH Items with * indicate a potential emergency and should be followed up as soon as possible.  Feel free to call the clinic you have any questions or concerns. The clinic phone number is [phone].  Please show the [facility] at check-in to the Emergency Department and triage nurse.

## 2015-11-02 NOTE — Progress Notes (Signed)
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 She saw Dr Sondra Come after repeat CT chest, with plan now to give definitive radiation with sensitizing CDDP. Radiation is planned 11-17 thru 12-05-15. Chemo today will be CDDP at 50 mg/m2 (no gemzar), then CDDP at sensitizing doses during radiation.  Simulation already done.  Patient has felt generally well, with no nausea, some fatigue last week better now, good appetite. Bowels are moving >=1x daily with miralax once daily, and we have discussed fact that she may need to hold miralax if stools increase during radiation, and may even need imodium if radiation diarrhea. She did oral prehydration for chemo today, voiding well. She had slight vaginal spotting Oct 25-28 and 11-5, none since. No other bleeding. No SOB or cough/ wheezing. No new or different pain. Peripheral IV access has been adequate.  Remainder of 10 point Review of Systems negative.  No PAC Flu vaccine done 09-01-15  ONCOLOGIC HISTORY Patient has had no regular medical care in years, including no PAP in >15 years. She had no bleeding since menopause until several hours of heavy vaginal bleeding in 04-2015, which resolved other than intermittent spotting until extremely heavy vaginal bleeding again 08-08-15. She was seen in [facility] ED then, with Hgb 8.0; she was given premarin and continued on oral megace x 2 weeks. She was seen by Dr Mallory Shirk on 08-16-15 , with finding of large fungating cervical mass with parametrial involvement. Cervical biopsy 08-17-15 ([id number]) showed moderately to poorly differentiated squamous cell carcinoma. She had CT CAP in [facility] on 08-21-15, with 1.7 cm oval mass upper outer right breast, multiple prominent  mediastinal lymph nodes and multiiple bilateral pulmonary nodules including a 2.7 x 1.8 cm nodule in RLL., retroperitoneal and external iliac adenopathy, 4.2 x 5.2 x 6.1 cm mass involving cervix and lower uterine segment, 1 cm sclerotic lesion in lateral left fifth rib, liver ok and no ureteral obstruction. She saw Dr Alycia Rossetti on 08-23-15, who agreed with diagnosis of cervical cancer at least IIIB, possibly IV if pulmonary mets. PET pending, recommendation if stage IV to use short course RT to control bleeding then carbo taxol +/- avastin; if PET shows pelvis only then radiation with sensitizing CDDP. Patient saw Dr Sondra Come in consultation on 08-30-15, with vaginal packing required for heavy bleeding. She had urgent radiation thru 09-04-15. PET 09-06-15 had uptake in cervical mass, periaortic and iliac nodes, left supraclavicular node, left subpectoral and axillary nodes, and in right breast mass. Biposy of right breast 09-08-15 benign. Carboplatin taxol was attempted on 09-22-15, with acute, severe taxol reaction after ~ 2 ml taxol had infused, this despite full oral and IV premedication for taxol. Regimen was changed to CDDP gemzar, cycle 1 given 10-06-15 and cycle 2 on 10-19-15. Case was reviewed at multidisciplinary conference, with repeat CT chest favoring reactive nodes over metastatic disease. Decision was made to give definitive radiation with sensitizing CDDP.       Objective:  Vital signs in last 24 hours:  BP 163/73 mmHg  Pulse 81  Temp(Src) 98.2 F (36.8 C) (Oral)  Resp 18  Ht _0  (1.626 m)  Wt 149 lb 1.6 oz (67.631 kg)  BMI 25.58 kg/m2  SpO2 98% Weight up 3 lbs in past week  Alert, oriented and appropriate. Ambulatory without difficulty, looks comfortable  HEENT:PERRL, sclerae not icteric. Oral mucosa moist without lesions, posterior pharynx clear.  Neck supple. No JVD.  Lymphatics:no cervical,supraclavicular or inguinal adenopathy Resp: clear to auscultation bilaterally and normal  percussion bilaterally Cardio: regular rate and rhythm. No gallop. GI: soft, nontender, not distended, no mass or organomegaly. Normally active bowel sounds. . Musculoskeletal/ Extremities: without pitting edema, cords, tenderness Neuro: no peripheral neuropathy. Otherwise nonfocal. Psych  Appropriate mood and affect Skin without rash, ecchymosis, petechiae   Lab Results:  Results for orders placed or performed in visit on 11/02/15  CBC with Differential  Result Value Ref Range   WBC 6.2 3.9 - 10.3 10e3/uL   NEUT# 3.9 1.5 - 6.5 10e3/uL   HGB 11.4 (L) 11.6 - 15.9 g/dL   HCT 34.6 (L) 34.8 - 46.6 %   Platelets 175 145 - 400 10e3/uL   MCV 88.7 79.5 - 101.0 fL   MCH 29.2 25.1 - 34.0 pg   MCHC 32.9 31.5 - 36.0 g/dL   RBC 3.90 3.70 - 5.45 10e6/uL   RDW 18.0 (H) 11.2 - 14.5 %   lymph# 1.4 0.9 - 3.3 10e3/uL   MONO# 0.9 0.1 - 0.9 10e3/uL   Eosinophils Absolute 0.1 0.0 - 0.5 10e3/uL   Basophils Absolute 0.0 0.0 - 0.1 10e3/uL   NEUT% 63.0 38.4 - 76.8 %   LYMPH% 21.6 14.0 - 49.7 %   MONO% 14.1 (H) 0.0 - 14.0 %   EOS% 1.1 0.0 - 7.0 %   BASO% 0.2 0.0 - 2.0 %  Comprehensive metabolic panel (Cmet) - [facility]  Result Value Ref Range   Sodium 135 (L) 136 - 145 mEq/L   Potassium 4.0 3.5 - 5.1 mEq/L   Chloride 103 98 - 109 mEq/L   CO2 21 (L) 22 - 29 mEq/L   Glucose 80 70 - 140 mg/dl   BUN 14.4 7.0 - 26.0 mg/dL   Creatinine 0.7 0.6 - 1.1 mg/dL   Total Bilirubin <0.30 0.20 - 1.20 mg/dL   Alkaline Phosphatase 85 40 - 150 U/L   AST 21 5 - 34 U/L   ALT 29 0 - 55 U/L   Total Protein 7.2 6.4 - 8.3 g/dL   Albumin 3.7 3.5 - 5.0 g/dL   Calcium 9.7 8.4 - 10.4 mg/dL   Anion Gap 11 3 - 11 mEq/L   EGFR >90 >90 ml/min/1.73 m2  Magnesium  Result Value Ref Range   Magnesium 2.2 1.5 - 2.5 mg/dl     Studies/Results: CT CHEST WITH CONTRAST 10-24-15  COMPARISON: PET-CT on 09/06/2015  FINDINGS: Mediastinum/Lymph Nodes: No masses or pathologically enlarged lymph nodes identified. Sub-cm left axillary  lymph nodes are minimally decreased in prominence since previous study, suggesting resolving reactive or inflammatory etiology. Tiny sub-cm left supraclavicular lymph nodes remain stable and are not pathologically enlarged. No hilar or mediastinal lymphadenopathy identified.  Lungs/Pleura: No pulmonary infiltrate or mass identified. No effusion present. Mild right lower lobe scarring is stable. Mild emphysema again noted.  Musculoskeletal/Soft Tissues: No suspicious bone lesions or other significant chest wall abnormality. 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  Upper Abdomen: Cholelithiasis again noted, without evidence of cholecystitis.  IMPRESSION: Minimally decreased prominence of sub-cm left axillary lymph nodes, and stable sub-cm left supraclavicular lymph nodes. This suggests a resolving reactive or inflammatory etiology, with metastatic disease considered less likely.  No definite evidence of metastatic disease  or other acute findings within the thorax.   Medications: I have reviewed the patient's current medications.  DISCUSSION: Patient understands rationale for change in plan of treatment and is in agreement with chemo as sensitizing CDDP. Due to scheduling, she will have CDDP today at 50 mg/m2, then treatment ~ 11-16, 11-28 and 12-5  Assessment/Plan: 1. Squamous cell carcinoma cervix: moderately to poorly differentiated, initially thought metastic to paraaortic nodes and left supraclavicular node by PET. Bleeding initially resolved with short course palliative radiation. After anaphylaxis to taxol, regimen changed to CDDP gemzar, given 10-06-15 and 10-19-15. Repeat chest imaging does not suggest metastatic disease, so that plan is now definitive radiation with sensitizing CDDP. She will have CDDP only today, then 11-16, 11-28, 12-5. 2.acute anaphylactic reaction to taxol 09-22-15  despite full premedication 3.long and ongoing tobacco, which she is now actively tryiing to stop.  4..iron deficiency anemia by iron studies Sept, with recent heavy vaginal bleeding: Transfused 2 units PRBCs + 56m feraheme on 09-11-15. Will change oral iron to Hemocyte/ ferrous fumarate and follow.Hemoglobin back in normal range now 5..right breast mass upper outer quadrant: biopsy with benign findings, 6 month follow up diagnostic mammogram + [place]commended  6. Never colonoscopy 7.post carpal tunnel release 8.sclerotic rib lesion on CT no correlate on PET 9.CAD by CT 10.cholelithiasis by CT 11.abnormal enhancement right renal collecting system by CT 12.peripheral [facility] easier than anticipated, [facility] 13.[place]. 14.flu vaccine given 09-01-15     All questions answered. Chemo orders changed and confirmed. Time spent 25 min including >50% counseling and coordination of care.   Araina Butrick P, MD   11/02/2015, 8:53 AM

## 2015-11-04 DIAGNOSIS — C539 Malignant neoplasm of cervix uteri, unspecified: Secondary | ICD-10-CM | POA: Insufficient documentation

## 2015-11-06 ENCOUNTER — Telehealth: Payer: Self-pay | Admitting: Oncology

## 2015-11-06 NOTE — Progress Notes (Signed)
  Radiation Oncology         [phone] ________________________________  Name: Valerie Figueroa MRN: [id number]  Date: 10/31/2015  DOB: [birth year]  SIMULATION AND TREATMENT PLANNING NOTE    ICD-9-CM ICD-10-CM   1. Malignant neoplasm of overlapping sites of cervix ([place]) 180.8 C53.8     DIAGNOSIS:  FIGO Stage IIIB squamous cell carcinoma cervix  NARRATIVE:  The patient was brought to the [facility].  Identity was confirmed.  All relevant records and images related to the planned course of therapy were reviewed.  The patient freely provided informed written consent to proceed with treatment after reviewing the details related to the planned course of therapy. The consent form was witnessed and verified by the simulation staff.  Then, the patient was set-up in a stable reproducible  supine position for radiation therapy.  CT images were obtained.  Surface markings were placed.  The CT images were loaded into the planning software.  Then the target and avoidance structures were contoured.  Treatment planning then occurred.  The radiation prescription was entered and confirmed.  Then, I designed and supervised the construction of a total of 1 medically necessary complex treatment devices.  I have requested : Intensity Modulated Radiotherapy (IMRT) is medically necessary for this case for the following reason:  Small bowel sparing and need for SIB..  I have ordered:dose calc.  PLAN:  The patient will receive 32.4 Gy in 18 fractions. In summing the patient's previous treatment (9 gray in 3 fractions directed at the pelvis this is a cumulative dose of 45 gray to the pelvis when making adjustments for BED and alpha/beta ratio.  After this is complete the patient will receive a boost to the periaortic area as well as to the PET positive nodes. She will also receive a sidewall boost in light of her stage III B disease. She will then proceed with intracavitary brachytherapy  treatments  ________________________________   Special treatment procedure note:  Patient will be receiving radiosensitizing chemotherapy during the course of her external beam treatments. Given the increased potential for toxicities as well as the necessity for close monitoring of the patient and the fact that she is also receiving brachytherapy, this constitutes a special treatment procedure -----------------------------------  Blair Promise, PhD, MD

## 2015-11-06 NOTE — Telephone Encounter (Signed)
Spoke with patient and she is aware of her 11/16 chemo and lab

## 2015-11-08 ENCOUNTER — Ambulatory Visit (HOSPITAL_BASED_OUTPATIENT_CLINIC_OR_DEPARTMENT_OTHER): Payer: BLUE CROSS/BLUE SHIELD

## 2015-11-08 ENCOUNTER — Other Ambulatory Visit (HOSPITAL_BASED_OUTPATIENT_CLINIC_OR_DEPARTMENT_OTHER): Payer: BLUE CROSS/BLUE SHIELD

## 2015-11-08 VITALS — BP 160/82 | HR 89 | Temp 97.9°F | Resp 20

## 2015-11-08 DIAGNOSIS — C539 Malignant neoplasm of cervix uteri, unspecified: Secondary | ICD-10-CM | POA: Diagnosis not present

## 2015-11-08 DIAGNOSIS — C531 Malignant neoplasm of exocervix: Secondary | ICD-10-CM

## 2015-11-08 DIAGNOSIS — Z510 Encounter for antineoplastic radiation therapy: Secondary | ICD-10-CM | POA: Diagnosis not present

## 2015-11-08 DIAGNOSIS — Z5111 Encounter for antineoplastic chemotherapy: Secondary | ICD-10-CM

## 2015-11-08 LAB — COMPREHENSIVE METABOLIC PANEL (CC13)
ALT: 29 U/L (ref 0–55)
AST: 18 U/L (ref 5–34)
Albumin: 3.7 g/dL (ref 3.5–5.0)
Alkaline Phosphatase: 85 U/L (ref 40–150)
Anion Gap: 10 mEq/L (ref 3–11)
BUN: 12.4 mg/dL (ref 7.0–26.0)
CALCIUM: 9.4 mg/dL (ref 8.4–10.4)
CHLORIDE: 99 meq/L (ref 98–109)
CO2: 23 meq/L (ref 22–29)
CREATININE: 0.7 mg/dL (ref 0.6–1.1)
EGFR: >90 mL/min/{1.73_m2} (ref >90)
Glucose: 83 mg/dl (ref 70–140)
Potassium: 4.2 mEq/L (ref 3.5–5.1)
Sodium: 132 mEq/L — ABNORMAL LOW (ref 136–145)
TOTAL PROTEIN: 7 g/dL (ref 6.4–8.3)

## 2015-11-08 LAB — CBC WITH DIFFERENTIAL/PLATELET
BASO%: 0 % (ref 0.0–2.0)
BASOS ABS: 0 10*3/uL (ref 0.0–0.1)
EOS%: 1.1 % (ref 0.0–7.0)
Eosinophils Absolute: 0.1 10*3/uL (ref 0.0–0.5)
HEMATOCRIT: 33.6 % — AB (ref 34.8–46.6)
HEMOGLOBIN: 11 g/dL — AB (ref 11.6–15.9)
LYMPH#: 1.7 10*3/uL (ref 0.9–3.3)
LYMPH%: 18 % (ref 14.0–49.7)
MCH: 29.3 pg (ref 25.1–34.0)
MCHC: 32.7 g/dL (ref 31.5–36.0)
MCV: 89.6 fL (ref 79.5–101.0)
MONO#: 1.2 10*3/uL — ABNORMAL HIGH (ref 0.1–0.9)
MONO%: 12.1 % (ref 0.0–14.0)
NEUT#: 6.5 10*3/uL (ref 1.5–6.5)
NEUT%: 68.8 % (ref 38.4–76.8)
Platelets: 325 10*3/uL (ref 145–400)
RBC: 3.75 10*6/uL (ref 3.70–5.45)
RDW: 18.1 % — AB (ref 11.2–14.5)
WBC: 9.5 10*3/uL (ref 3.9–10.3)

## 2015-11-08 LAB — MAGNESIUM (CC13): Magnesium: 2 mg/dl (ref 1.5–2.5)

## 2015-11-08 MED ORDER — POTASSIUM CHLORIDE 2 MEQ/ML IV SOLN
Freq: Once | INTRAVENOUS | Status: AC
  Administered 2015-11-08: 09:00:00 via INTRAVENOUS
  Filled 2015-11-08: qty 10

## 2015-11-08 MED ORDER — SODIUM CHLORIDE 0.9 % IV SOLN
Freq: Once | INTRAVENOUS | Status: AC
  Administered 2015-11-08: 09:00:00 via INTRAVENOUS

## 2015-11-08 MED ORDER — SODIUM CHLORIDE 0.9 % IV SOLN
Freq: Once | INTRAVENOUS | Status: AC
  Administered 2015-11-08: 11:00:00 via INTRAVENOUS
  Filled 2015-11-08: qty 5

## 2015-11-08 MED ORDER — PALONOSETRON HCL INJECTION 0.25 MG/5ML
INTRAVENOUS | Status: AC
  Filled 2015-11-08: qty 5

## 2015-11-08 MED ORDER — SODIUM CHLORIDE 0.9 % IV SOLN
40.0000 mg/m2 | Freq: Once | INTRAVENOUS | Status: AC
  Administered 2015-11-08: 70 mg via INTRAVENOUS
  Filled 2015-11-08: qty 70

## 2015-11-08 MED ORDER — PALONOSETRON HCL INJECTION 0.25 MG/5ML
0.2500 mg | Freq: Once | INTRAVENOUS | Status: AC
  Administered 2015-11-08: 0.25 mg via INTRAVENOUS

## 2015-11-08 NOTE — Patient Instructions (Signed)
[facility] Discharge Instructions for Patients Receiving Chemotherapy  Today you received the following chemotherapy agents cisplatin  To help prevent nausea and vomiting after your treatment, we encourage you to take your nausea medication   If you develop nausea and vomiting that is not controlled by your nausea medication, call the clinic.   BELOW ARE SYMPTOMS THAT SHOULD BE REPORTED IMMEDIATELY:  *FEVER GREATER THAN 100.5 F  *CHILLS WITH OR WITHOUT FEVER  NAUSEA AND VOMITING THAT IS NOT CONTROLLED WITH YOUR NAUSEA MEDICATION  *UNUSUAL SHORTNESS OF BREATH  *UNUSUAL BRUISING OR BLEEDING  TENDERNESS IN MOUTH AND THROAT WITH OR WITHOUT PRESENCE OF ULCERS  *URINARY PROBLEMS  *BOWEL PROBLEMS  UNUSUAL RASH Items with * indicate a potential emergency and should be followed up as soon as possible.  Feel free to call the clinic you have any questions or concerns. The clinic phone number is [phone].  Please show the [place] at check-in to the Emergency Department and triage nurse.

## 2015-11-09 ENCOUNTER — Ambulatory Visit
Admission: RE | Admit: 2015-11-09 | Discharge: 2015-11-09 | Disposition: A | Discharge status: Home / Self Care | Payer: BLUE CROSS/BLUE SHIELD | Source: Ambulatory Visit | Attending: Radiation Oncology | Admitting: Radiation Oncology

## 2015-11-09 DIAGNOSIS — C538 Malignant neoplasm of overlapping sites of cervix uteri: Secondary | ICD-10-CM

## 2015-11-09 DIAGNOSIS — Z510 Encounter for antineoplastic radiation therapy: Secondary | ICD-10-CM | POA: Diagnosis not present

## 2015-11-09 NOTE — Progress Notes (Signed)
  Radiation Oncology         [phone] ________________________________  Name: Valerie Figueroa MRN: [id number]  Date: 11/09/2015  DOB: 1956/08/21  Simulation Verification Note    ICD-9-CM ICD-10-CM   1. Malignant neoplasm of overlapping sites of cervix ([place]) 180.8 C53.8     Status: outpatient  NARRATIVE: The patient was brought to the treatment unit and placed in the planned treatment position. The clinical setup was verified. Then port films were obtained and uploaded to the radiation oncology medical record software.  The treatment beams were carefully compared against the planned radiation fields. The position location and shape of the radiation fields was reviewed. They targeted volume of tissue appears to be appropriately covered by the radiation beams. Organs at risk appear to be excluded as planned.  Based on my personal review, I approved the simulation verification. The patient's treatment will proceed as planned.  -----------------------------------  Blair Promise, PhD, MD

## 2015-11-10 ENCOUNTER — Ambulatory Visit
Admission: RE | Admit: 2015-11-10 | Discharge: 2015-11-10 | Disposition: A | Discharge status: Home / Self Care | Payer: BLUE CROSS/BLUE SHIELD | Source: Ambulatory Visit | Attending: Radiation Oncology | Admitting: Radiation Oncology

## 2015-11-10 DIAGNOSIS — Z510 Encounter for antineoplastic radiation therapy: Secondary | ICD-10-CM | POA: Diagnosis not present

## 2015-11-12 ENCOUNTER — Ambulatory Visit
Admission: RE | Admit: 2015-11-12 | Discharge: 2015-11-12 | Disposition: A | Discharge status: Home / Self Care | Payer: BLUE CROSS/BLUE SHIELD | Source: Ambulatory Visit | Attending: Radiation Oncology | Admitting: Radiation Oncology

## 2015-11-12 DIAGNOSIS — Z510 Encounter for antineoplastic radiation therapy: Secondary | ICD-10-CM | POA: Diagnosis not present

## 2015-11-13 ENCOUNTER — Ambulatory Visit
Admission: RE | Admit: 2015-11-13 | Discharge: 2015-11-13 | Disposition: A | Discharge status: Home / Self Care | Payer: BLUE CROSS/BLUE SHIELD | Source: Ambulatory Visit | Attending: Radiation Oncology | Admitting: Radiation Oncology

## 2015-11-13 DIAGNOSIS — Z510 Encounter for antineoplastic radiation therapy: Secondary | ICD-10-CM | POA: Diagnosis not present

## 2015-11-14 ENCOUNTER — Ambulatory Visit
Admission: RE | Admit: 2015-11-14 | Discharge: 2015-11-14 | Disposition: A | Discharge status: Home / Self Care | Payer: BLUE CROSS/BLUE SHIELD | Source: Ambulatory Visit | Attending: Radiation Oncology | Admitting: Radiation Oncology

## 2015-11-14 ENCOUNTER — Encounter: Payer: Self-pay | Admitting: Radiation Oncology

## 2015-11-14 VITALS — BP 130/77 | HR 82 | Temp 98.1°F | Resp 16 | Ht 64.0 in | Wt 150.5 lb

## 2015-11-14 DIAGNOSIS — Z510 Encounter for antineoplastic radiation therapy: Secondary | ICD-10-CM | POA: Diagnosis not present

## 2015-11-14 DIAGNOSIS — C538 Malignant neoplasm of overlapping sites of cervix uteri: Secondary | ICD-10-CM

## 2015-11-14 DIAGNOSIS — C539 Malignant neoplasm of cervix uteri, unspecified: Secondary | ICD-10-CM

## 2015-11-14 NOTE — Progress Notes (Signed)
  Radiation Oncology         [phone] ________________________________  Name: Valerie Figueroa MRN: [id number]  Date: 11/14/2015  DOB: 07-12-1956  Weekly Radiation Therapy Management    ICD-9-CM ICD-10-CM   1. Malignant neoplasm of overlapping sites of cervix ([place]) 180.8 C53.8      Current Dose: 9 Gy     Planned Dose:  32.4+ Gy  Narrative . . . . . . . . The patient presents for routine under treatment assessment.                                   The patient is without complaint.  She denies pain. She reports having one loose bowel movement this morning. She reports having nausea yesterday and took zofran which helped. She denies having vaginal/rectal bleeding. She reports occasional fatigue.                                 Set-up films were reviewed.                                 The chart was checked. Physical Findings. . .  height is '5\' 4"'$  (1.626 m) and weight is 150 lb 8 oz (68.266 kg). Her oral temperature is 98.1 F (36.7 C). Her blood pressure is 130/77 and her pulse is 82. Her respiration is 16. .Lungs are clear to auscultation bilaterally. Heart has regular rate and rhythm. Abdomen soft nontender with normal bowel sounds. Impression . . . . . . . The patient is tolerating radiation. Plan . . . . . . . . . . . . Continue treatment as planned.  ________________________________   Blair Promise, PhD, MD

## 2015-11-14 NOTE — Progress Notes (Signed)
Valerie Figueroa has completed 5 fractions to her pelvis/abdomen.  She denies pain.  She reports having one loose bowel movement this morning.  She reports having nausea yesterday and took zofran which helped.  She denies having vaginal/rectal bleeding.  She reports occasional fatigue.  BP 130/77 mmHg  Pulse 82  Temp(Src) 98.1 F (36.7 C) (Oral)  Resp 16  Ht '5\' 4"'$  (1.626 m)  Wt 150 lb 8 oz (68.266 kg)  BMI 25.82 kg/m2

## 2015-11-14 NOTE — Progress Notes (Signed)
Pt here for patient teaching.  Pt given Radiation and You booklet. Reviewed areas of pertinence such as diarrhea, fatigue and nausea and vomiting . Pt able to give teach back of to pat skin and use unscented/gentle soap,avoid applying anything to skin within 4 hours of treatment. Pt demonstrated understanding and verbalizes understanding of information given and will contact nursing with any questions or concerns.

## 2015-11-15 ENCOUNTER — Ambulatory Visit
Admission: RE | Admit: 2015-11-15 | Discharge: 2015-11-15 | Disposition: A | Discharge status: Home / Self Care | Payer: BLUE CROSS/BLUE SHIELD | Source: Ambulatory Visit | Attending: Radiation Oncology | Admitting: Radiation Oncology

## 2015-11-15 DIAGNOSIS — Z510 Encounter for antineoplastic radiation therapy: Secondary | ICD-10-CM | POA: Diagnosis not present

## 2015-11-15 NOTE — Progress Notes (Signed)
Patient was given a sitz bath as she has some vaginal irritation.  She was educated on how to use it.  She also mentioned that she had a small amount of vaginal bleeding yesterday that stopped after a few hours.  She has not had any this morning.

## 2015-11-19 ENCOUNTER — Other Ambulatory Visit: Payer: Self-pay | Admitting: Oncology

## 2015-11-20 ENCOUNTER — Ambulatory Visit: Payer: BLUE CROSS/BLUE SHIELD

## 2015-11-20 ENCOUNTER — Ambulatory Visit
Admission: RE | Admit: 2015-11-20 | Discharge: 2015-11-20 | Disposition: A | Discharge status: Home / Self Care | Payer: BLUE CROSS/BLUE SHIELD | Source: Ambulatory Visit | Attending: Radiation Oncology | Admitting: Radiation Oncology

## 2015-11-20 ENCOUNTER — Ambulatory Visit (HOSPITAL_BASED_OUTPATIENT_CLINIC_OR_DEPARTMENT_OTHER): Payer: BLUE CROSS/BLUE SHIELD

## 2015-11-20 ENCOUNTER — Ambulatory Visit (HOSPITAL_BASED_OUTPATIENT_CLINIC_OR_DEPARTMENT_OTHER): Payer: BLUE CROSS/BLUE SHIELD | Admitting: Oncology

## 2015-11-20 ENCOUNTER — Telehealth: Payer: Self-pay | Admitting: *Deleted

## 2015-11-20 ENCOUNTER — Other Ambulatory Visit (HOSPITAL_BASED_OUTPATIENT_CLINIC_OR_DEPARTMENT_OTHER): Payer: BLUE CROSS/BLUE SHIELD

## 2015-11-20 ENCOUNTER — Encounter: Payer: Self-pay | Admitting: Oncology

## 2015-11-20 VITALS — BP 158/72 | HR 76 | Temp 97.9°F | Resp 18 | Ht 64.0 in | Wt 152.5 lb

## 2015-11-20 DIAGNOSIS — Z5111 Encounter for antineoplastic chemotherapy: Secondary | ICD-10-CM | POA: Diagnosis not present

## 2015-11-20 DIAGNOSIS — C539 Malignant neoplasm of cervix uteri, unspecified: Secondary | ICD-10-CM

## 2015-11-20 DIAGNOSIS — K8020 Calculus of gallbladder without cholecystitis without obstruction: Secondary | ICD-10-CM | POA: Diagnosis not present

## 2015-11-20 DIAGNOSIS — Z720 Tobacco use: Secondary | ICD-10-CM | POA: Diagnosis not present

## 2015-11-20 DIAGNOSIS — C531 Malignant neoplasm of exocervix: Secondary | ICD-10-CM

## 2015-11-20 DIAGNOSIS — I2510 Atherosclerotic heart disease of native coronary artery without angina pectoris: Secondary | ICD-10-CM

## 2015-11-20 DIAGNOSIS — R918 Other nonspecific abnormal finding of lung field: Secondary | ICD-10-CM

## 2015-11-20 LAB — COMPREHENSIVE METABOLIC PANEL (CC13)
ALT: 17 U/L (ref 0–55)
ANION GAP: 10 meq/L (ref 3–11)
AST: 15 U/L (ref 5–34)
Albumin: 3.5 g/dL (ref 3.5–5.0)
Alkaline Phosphatase: 73 U/L (ref 40–150)
BUN: 12.3 mg/dL (ref 7.0–26.0)
CHLORIDE: 101 meq/L (ref 98–109)
CO2: 20 meq/L — AB (ref 22–29)
CREATININE: 0.7 mg/dL (ref 0.6–1.1)
Calcium: 9.5 mg/dL (ref 8.4–10.4)
EGFR: >90 mL/min/{1.73_m2} (ref >90)
GLUCOSE: 85 mg/dL (ref 70–140)
Potassium: 3.9 mEq/L (ref 3.5–5.1)
SODIUM: 132 meq/L — AB (ref 136–145)
Total Bilirubin: <0.3 mg/dL (ref 0.20–1.20)
Total Protein: 7.3 g/dL (ref 6.4–8.3)

## 2015-11-20 LAB — CBC WITH DIFFERENTIAL/PLATELET
BASO%: 0 % (ref 0.0–2.0)
Basophils Absolute: 0 10*3/uL (ref 0.0–0.1)
EOS%: 0.4 % (ref 0.0–7.0)
Eosinophils Absolute: 0 10*3/uL (ref 0.0–0.5)
HCT: 31.6 % — ABNORMAL LOW (ref 34.8–46.6)
HGB: 10.6 g/dL — ABNORMAL LOW (ref 11.6–15.9)
LYMPH%: 9.6 % — AB (ref 14.0–49.7)
MCH: 30.3 pg (ref 25.1–34.0)
MCHC: 33.5 g/dL (ref 31.5–36.0)
MCV: 90.3 fL (ref 79.5–101.0)
MONO#: 0.7 10*3/uL (ref 0.1–0.9)
MONO%: 13.3 % (ref 0.0–14.0)
NEUT#: 4.2 10*3/uL (ref 1.5–6.5)
NEUT%: 76.7 % (ref 38.4–76.8)
PLATELETS: 159 10*3/uL (ref 145–400)
RBC: 3.5 10*6/uL — AB (ref 3.70–5.45)
RDW: 19 % — ABNORMAL HIGH (ref 11.2–14.5)
WBC: 5.4 10*3/uL (ref 3.9–10.3)
lymph#: 0.5 10*3/uL — ABNORMAL LOW (ref 0.9–3.3)

## 2015-11-20 LAB — MAGNESIUM (CC13): MAGNESIUM: 2 mg/dL (ref 1.5–2.5)

## 2015-11-20 MED ORDER — PALONOSETRON HCL INJECTION 0.25 MG/5ML
INTRAVENOUS | Status: AC
  Filled 2015-11-20: qty 5

## 2015-11-20 MED ORDER — PALONOSETRON HCL INJECTION 0.25 MG/5ML
0.2500 mg | Freq: Once | INTRAVENOUS | Status: AC
  Administered 2015-11-20: 0.25 mg via INTRAVENOUS

## 2015-11-20 MED ORDER — DEXTROSE-NACL 5-0.45 % IV SOLN
Freq: Once | INTRAVENOUS | Status: AC
  Administered 2015-11-20: 10:00:00 via INTRAVENOUS
  Filled 2015-11-20: qty 10

## 2015-11-20 MED ORDER — SODIUM CHLORIDE 0.9 % IV SOLN
40.0000 mg/m2 | Freq: Once | INTRAVENOUS | Status: AC
  Administered 2015-11-20: 70 mg via INTRAVENOUS
  Filled 2015-11-20: qty 70

## 2015-11-20 MED ORDER — SODIUM CHLORIDE 0.9 % IV SOLN
Freq: Once | INTRAVENOUS | Status: AC
  Administered 2015-11-20: 09:00:00 via INTRAVENOUS

## 2015-11-20 MED ORDER — SODIUM CHLORIDE 0.9 % IV SOLN
Freq: Once | INTRAVENOUS | Status: AC
  Administered 2015-11-20: 11:00:00 via INTRAVENOUS
  Filled 2015-11-20: qty 5

## 2015-11-20 NOTE — Patient Instructions (Signed)
Because you get an IV nausea medicine called Aloxi with each of your chemo treatments, it is best if you use the every 6 hr as needed lorazepam/ Ativan for 2 days after chemo instead of using the every 8 hour as needed ondansetron/ zofran those days. After 2 days ok to use the every 8 hour as needed ondansetron/ zofran.  Chemo on Monday 11-28. On Monday, Tuesday and Wednesday you should use the every 6 hr lorazepam/ ativan if needed for nausea  Starting Thursday ok to use every 8 hr ondansetron/ zofran as needed for nausea.   If nausea is not controlled this way, call Dr Mariana Kaufman nurse and we will try another nausea medicine. The lorazepam/Ativan will make you sleepy and a little forgetful. You should not drive with this medicine.   Imodium: 2 tablets after each diarrhea stool up to 8 tablets in 24 hours. Call if diarrhea is not controlled with this imodium.

## 2015-11-20 NOTE — Telephone Encounter (Signed)
Per POF I have scheduled appts. Patient given caledndar. JMW

## 2015-11-20 NOTE — Patient Instructions (Signed)
[facility] Discharge Instructions for Patients Receiving Chemotherapy  Today you received the following chemotherapy agents cisplatin,gemzar  To help prevent nausea and vomiting after your treatment, we encourage you to take your nausea medication as prescribed.   If you develop nausea and vomiting that is not controlled by your nausea medication, call the clinic.   BELOW ARE SYMPTOMS THAT SHOULD BE REPORTED IMMEDIATELY:  *FEVER GREATER THAN 100.5 F  *CHILLS WITH OR WITHOUT FEVER  NAUSEA AND VOMITING THAT IS NOT CONTROLLED WITH YOUR NAUSEA MEDICATION  *UNUSUAL SHORTNESS OF BREATH  *UNUSUAL BRUISING OR BLEEDING  TENDERNESS IN MOUTH AND THROAT WITH OR WITHOUT PRESENCE OF ULCERS  *URINARY PROBLEMS  *BOWEL PROBLEMS  UNUSUAL RASH Items with * indicate a potential emergency and should be followed up as soon as possible.  Feel free to call the clinic you have any questions or concerns. The clinic phone number is [phone].  Please show the [facility] at check-in to the Emergency Department and triage nurse.

## 2015-11-20 NOTE — Progress Notes (Signed)
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 IMRT is planned thru 12-15-15, with #2 sensitizing CDDP today.  Patient had mild nausea early last week, resolved with prn zofran. She had loose stools x2 on 11-26, improved with imodium. She has not taken miralax since beginning RT, did have slightly loose stool this AM. She has had minimal vaginal spotting, no other bleeding, and continues oral ferrous fumarate. Peripheral IV access still adequate. She has done all of oral prehydration for treatment today. She denies SOB with exertion, cough or other respiratory symptoms. No significant peripheral neuropathy. No LE swelling. She is able to eat. No alopecia. Continues to smoke tho still trying to cut down (see below). No fever or symptoms of infection. Remainder of 10 point Review of Systems negative/ unchanged.  No PAC Flu vaccine done 09-01-15  ONCOLOGIC HISTORY Patient has had no regular medical care in years, including no PAP in >15 years. She had no bleeding since menopause until several hours of heavy vaginal bleeding in 04-2015, which resolved other than intermittent spotting until extremely heavy vaginal bleeding again 08-08-15. She was seen in [facility] ED then, with Hgb 8.0; she was given premarin and continued on oral megace x 2 weeks. She was seen by Dr Mallory Shirk on 08-16-15 , with finding of large fungating cervical mass with parametrial involvement. Cervical biopsy 08-17-15 ([id number]) showed moderately to poorly differentiated squamous cell carcinoma. She had CT CAP in [facility] system on 08-21-15, with 1.7 cm oval mass upper outer right breast, multiple prominent mediastinal lymph  nodes and multiiple bilateral pulmonary nodules including a 2.7 x 1.8 cm nodule in RLL., retroperitoneal and external iliac adenopathy, 4.2 x 5.2 x 6.1 cm mass involving cervix and lower uterine segment, 1 cm sclerotic lesion in lateral left fifth rib, liver ok and no ureteral obstruction. She saw Dr Alycia Rossetti on 08-23-15, who agreed with diagnosis of cervical cancer at least IIIB, possibly IV if pulmonary mets. PET pending, recommendation if stage IV to use short course RT to control bleeding then carbo taxol +/- avastin; if PET shows pelvis only then radiation with sensitizing CDDP. Patient saw Dr Sondra Come in consultation on 08-30-15, with vaginal packing required for heavy bleeding. She had urgent radiation thru 09-04-15. PET 09-06-15 had uptake in cervical mass, periaortic and iliac nodes, left supraclavicular node, left subpectoral and axillary nodes, and in right breast mass. Biposy of right breast 09-08-15 benign. Carboplatin taxol was attempted on 09-22-15, with acute, severe taxol reaction after ~ 2 ml taxol had infused, this despite full oral and IV premedication for taxol. Regimen was changed to CDDP gemzar, cycle 1 given 10-06-15 and cycle 2 on 10-19-15. Case was reviewed at multidisciplinary conference, with repeat CT chest favoring reactive nodes over metastatic disease. Decision was made to give definitive radiation with sensitizing CDDP.      Objective:  Vital signs in last 24 hours:  BP 158/72 mmHg  Pulse 76  Temp(Src) 97.9 F (36.6 C) (Oral)  Resp 18  Ht '5\' 4"'$  (1.626 m)  Wt 152 lb 8 oz (69.174 kg)  BMI 26.16 kg/m2  SpO2 100% Weight up 2 lbs Alert, oriented and appropriate. Ambulatory without difficulty.  No alopecia  HEENT:PERRL, sclerae not icteric. Oral mucosa moist without lesions, posterior pharynx clear.  Neck supple. No JVD.  Lymphatics:no supraclavicular adenopathy Resp: BS heard bilaterally, minimal expiratory wheeze lower fields posteriorly  and no dullness to percussion  bilaterally Cardio: regular rate and rhythm. No gallop. GI: soft, nontender, not distended, no mass or organomegaly. A few bowel sounds.  Musculoskeletal/ Extremities: LE without pitting edema, cords, tenderness Neuro: no peripheral neuropathy. Otherwise nonfocal Skin without rash, ecchymosis, petechiae   Lab Results:  Results for orders placed or performed in visit on 11/20/15  CBC with Differential  Result Value Ref Range   WBC 5.4 3.9 - 10.3 10e3/uL   NEUT# 4.2 1.5 - 6.5 10e3/uL   HGB 10.6 (L) 11.6 - 15.9 g/dL   HCT 31.6 (L) 34.8 - 46.6 %   Platelets 159 145 - 400 10e3/uL   MCV 90.3 79.5 - 101.0 fL   MCH 30.3 25.1 - 34.0 pg   MCHC 33.5 31.5 - 36.0 g/dL   RBC 3.50 (L) 3.70 - 5.45 10e6/uL   RDW 19.0 (H) 11.2 - 14.5 %   lymph# 0.5 (L) 0.9 - 3.3 10e3/uL   MONO# 0.7 0.1 - 0.9 10e3/uL   Eosinophils Absolute 0.0 0.0 - 0.5 10e3/uL   Basophils Absolute 0.0 0.0 - 0.1 10e3/uL   NEUT% 76.7 38.4 - 76.8 %   LYMPH% 9.6 (L) 14.0 - 49.7 %   MONO% 13.3 0.0 - 14.0 %   EOS% 0.4 0.0 - 7.0 %   BASO% 0.0 0.0 - 2.0 %   CMET and Mg resulted prior to chemo today, with Na 132, CO2 20, creat 0.7, K 3.9 and Mg 2.0  Studies/Results:  No results found.  Medications: I have reviewed the patient's current medications. Reviewed use of imodium with RT diarrhea (2 tablets after each loose stool, call if not controlled with 8 in 24 hrs).   DISCUSSION RT diarrhea discussed, imodium as above and recommended sports drinks or juice in preference to water if diarrhea. Discussed ongoing smoking, still just under 1/2 ppd, hardest time is in AMs. Declined nicotine patch. Discussed using AM appointments at [facility] to cut back further. Discussed slight anemia, related to chemo and RT. Only bleeding is the very slight vaginal spotting.   Assessment/Plan:  1. Squamous cell carcinoma cervix: moderately to poorly differentiated, initially thought metastic to paraaortic nodes and left supraclavicular node by PET.  Bleeding initially resolved with short course palliative radiation. After anaphylaxis to taxol, regimen changed to CDDP gemzar, given 10-06-15 and 10-19-15. As repeat chest imaging did not suggest metastatic disease, she is now receiving definitive radiation with sensitizing CDDP.  Will continue weekly CDDP thru 12-19, with IMRT scheduled thru 12-15-15. 2.acute anaphylactic reaction to taxol 09-22-15 despite full premedication 3.long and ongoing tobacco, which she is tryiing to stop. Discussed and encouraged again. 4..iron deficiency anemia by iron studies Sept, due to heavy vaginal bleeding: Transfused 2 units PRBCs + '510mg'$  feraheme on 09-11-15. Will change oral iron to Hemocyte/ ferrous fumarate and follow.Hemoglobin back in normal range now 5..right breast mass upper outer quadrant: biopsy with benign findings, 6 month follow up diagnostic mammogram + [place] recommended (March) 6. Never colonoscopy 7.post carpal tunnel release 8.sclerotic rib lesion on CT no correlate on PET 9.CAD by CT 10.cholelithiasis by CT 11.abnormal enhancement right renal collecting system by CT 12.peripheral venous access easier than anticipated, and she prefers this to [facility]. 13.Lives in [place], but has wanted treatment in [place], which will be easiest to coordinate with radiation.  14.flu vaccine given 09-01-15   All questions answered, CDDP orders confirmed. Time spent 25 min including >50% counseling and coordination of care.   LIVESAY,LENNIS P, MD   11/20/2015, 8:40 AM

## 2015-11-21 ENCOUNTER — Ambulatory Visit: Payer: BLUE CROSS/BLUE SHIELD

## 2015-11-21 ENCOUNTER — Telehealth: Payer: Self-pay | Admitting: Oncology

## 2015-11-21 ENCOUNTER — Encounter: Payer: Self-pay | Admitting: Radiation Oncology

## 2015-11-21 ENCOUNTER — Encounter: Payer: Self-pay | Admitting: Oncology

## 2015-11-21 ENCOUNTER — Ambulatory Visit
Admission: RE | Admit: 2015-11-21 | Discharge: 2015-11-21 | Disposition: A | Discharge status: Home / Self Care | Payer: BLUE CROSS/BLUE SHIELD | Source: Ambulatory Visit | Attending: Radiation Oncology | Admitting: Radiation Oncology

## 2015-11-21 VITALS — BP 144/71 | HR 79 | Resp 16 | Wt 153.6 lb

## 2015-11-21 DIAGNOSIS — Z510 Encounter for antineoplastic radiation therapy: Secondary | ICD-10-CM | POA: Diagnosis not present

## 2015-11-21 DIAGNOSIS — C538 Malignant neoplasm of overlapping sites of cervix uteri: Secondary | ICD-10-CM

## 2015-11-21 MED ORDER — LORAZEPAM 0.5 MG PO TABS
ORAL_TABLET | ORAL | Status: DC
Start: 2015-11-21 — End: 2016-09-05

## 2015-11-21 NOTE — Progress Notes (Signed)
  Radiation Oncology         [phone]) [phone] ________________________________  Name: SHARAYA BORUFF MRN: [id number]  Date: 11/21/2015  DOB: [birth year]  Weekly Radiation Therapy Management    ICD-9-CM ICD-10-CM   1. Malignant neoplasm of overlapping sites of cervix (HCC) 180.8 C53.8 LORazepam (ATIVAN) 0.5 MG tablet     Current Dose: 12.6 Gy     Planned Dose:  32.4+ Gy  Narrative . . . . . . . . The patient presents for routine under treatment assessment.                                   The patient is without complaint. . She is no some very mild intermittent nausea. She had diarrhea over the weekend this was controlled well with Imodium.                                 Set-up films were reviewed.                                 The chart was checked. Physical Findings. . .  weight is 153 lb 9.6 oz (69.673 kg). Her blood pressure is 144/71 and her pulse is 79. Her respiration is 16 and oxygen saturation is 100%. . The lungs are clear except for some very mild wheezing. The heart has a regular rhythm and rate. The abdomen is soft and nontender with normal bowel sounds. Impression . . . . . . . The patient is tolerating radiation. Plan . . . . . . . . . . . . Continue treatment as planned.  ________________________________   Blair Promise, PhD, MD

## 2015-11-21 NOTE — Progress Notes (Signed)
Faxed paperwork to Christella Scheuermann [phone] copy to medical records.

## 2015-11-21 NOTE — Telephone Encounter (Signed)
Called in to [facility] per Dr. Sondra Come for LORazepam (ATIVAN) 0.5 MG tablet -Place 1 tablet under the tongue or swallow every 6 hours as needed for nausea. Will make drowsy. Disp. 20 tablets. 0 refills.

## 2015-11-21 NOTE — Progress Notes (Signed)
Weight and vitals stable. Denies pain. Reports noting a scant amount of dark reddish brown blood when she wipes. Reports occasional nausea following radiation therapy for which she has an antiemetic prescribed. Reports she is now taking chemotherapy every week instead of every two weeks. Denies hematuria or dysuria. Reports diarrhea on Saturday which resolved with Imodium.   BP 144/71 mmHg  Pulse 79  Resp 16  Wt 153 lb 9.6 oz (69.673 kg)  SpO2 100% Wt Readings from Last 3 Encounters:  11/21/15 153 lb 9.6 oz (69.673 kg)  11/20/15 152 lb 8 oz (69.174 kg)  11/14/15 150 lb 8 oz (68.266 kg)

## 2015-11-22 ENCOUNTER — Ambulatory Visit
Admission: RE | Admit: 2015-11-22 | Discharge: 2015-11-22 | Disposition: A | Discharge status: Home / Self Care | Payer: BLUE CROSS/BLUE SHIELD | Source: Ambulatory Visit | Attending: Radiation Oncology | Admitting: Radiation Oncology

## 2015-11-22 ENCOUNTER — Telehealth: Payer: Self-pay | Admitting: Oncology

## 2015-11-22 DIAGNOSIS — Z510 Encounter for antineoplastic radiation therapy: Secondary | ICD-10-CM | POA: Diagnosis not present

## 2015-11-22 NOTE — Telephone Encounter (Signed)
Received a message to call Valerie Figueroa about her ativan.  Kathryne Hitch and she said when she first checked yesterday at [facility], they did not have her prescription.  She checked again this morning and they did have it.  She did not have any further questions or concerns.

## 2015-11-23 ENCOUNTER — Ambulatory Visit
Admission: RE | Admit: 2015-11-23 | Discharge: 2015-11-23 | Disposition: A | Discharge status: Home / Self Care | Payer: BLUE CROSS/BLUE SHIELD | Source: Ambulatory Visit | Attending: Radiation Oncology | Admitting: Radiation Oncology

## 2015-11-23 DIAGNOSIS — D649 Anemia, unspecified: Secondary | ICD-10-CM | POA: Diagnosis not present

## 2015-11-23 DIAGNOSIS — C539 Malignant neoplasm of cervix uteri, unspecified: Secondary | ICD-10-CM | POA: Insufficient documentation

## 2015-11-23 DIAGNOSIS — D759 Disease of blood and blood-forming organs, unspecified: Secondary | ICD-10-CM | POA: Insufficient documentation

## 2015-11-23 DIAGNOSIS — Z510 Encounter for antineoplastic radiation therapy: Secondary | ICD-10-CM | POA: Diagnosis present

## 2015-11-23 DIAGNOSIS — R110 Nausea: Secondary | ICD-10-CM | POA: Diagnosis not present

## 2015-11-24 ENCOUNTER — Ambulatory Visit
Admission: RE | Admit: 2015-11-24 | Discharge: 2015-11-24 | Disposition: A | Discharge status: Home / Self Care | Payer: BLUE CROSS/BLUE SHIELD | Source: Ambulatory Visit | Attending: Radiation Oncology | Admitting: Radiation Oncology

## 2015-11-24 ENCOUNTER — Other Ambulatory Visit: Payer: Self-pay | Admitting: Oncology

## 2015-11-24 DIAGNOSIS — Z510 Encounter for antineoplastic radiation therapy: Secondary | ICD-10-CM | POA: Diagnosis not present

## 2015-11-25 ENCOUNTER — Other Ambulatory Visit: Payer: Self-pay | Admitting: Oncology

## 2015-11-27 ENCOUNTER — Ambulatory Visit (HOSPITAL_BASED_OUTPATIENT_CLINIC_OR_DEPARTMENT_OTHER): Payer: BLUE CROSS/BLUE SHIELD | Admitting: Oncology

## 2015-11-27 ENCOUNTER — Ambulatory Visit (HOSPITAL_BASED_OUTPATIENT_CLINIC_OR_DEPARTMENT_OTHER): Payer: BLUE CROSS/BLUE SHIELD

## 2015-11-27 ENCOUNTER — Encounter: Payer: Self-pay | Admitting: Oncology

## 2015-11-27 ENCOUNTER — Ambulatory Visit
Admission: RE | Admit: 2015-11-27 | Discharge: 2015-11-27 | Disposition: A | Discharge status: Home / Self Care | Payer: BLUE CROSS/BLUE SHIELD | Source: Ambulatory Visit | Attending: Radiation Oncology | Admitting: Radiation Oncology

## 2015-11-27 ENCOUNTER — Other Ambulatory Visit (HOSPITAL_BASED_OUTPATIENT_CLINIC_OR_DEPARTMENT_OTHER): Payer: BLUE CROSS/BLUE SHIELD

## 2015-11-27 VITALS — BP 147/69 | HR 86 | Temp 97.9°F | Resp 20 | Ht 64.0 in | Wt 149.2 lb

## 2015-11-27 DIAGNOSIS — D509 Iron deficiency anemia, unspecified: Secondary | ICD-10-CM | POA: Diagnosis not present

## 2015-11-27 DIAGNOSIS — C531 Malignant neoplasm of exocervix: Secondary | ICD-10-CM | POA: Diagnosis not present

## 2015-11-27 DIAGNOSIS — C539 Malignant neoplasm of cervix uteri, unspecified: Secondary | ICD-10-CM

## 2015-11-27 DIAGNOSIS — K520 Gastroenteritis and colitis due to radiation: Secondary | ICD-10-CM

## 2015-11-27 DIAGNOSIS — Z716 Tobacco abuse counseling: Secondary | ICD-10-CM

## 2015-11-27 DIAGNOSIS — D500 Iron deficiency anemia secondary to blood loss (chronic): Secondary | ICD-10-CM

## 2015-11-27 DIAGNOSIS — R918 Other nonspecific abnormal finding of lung field: Secondary | ICD-10-CM

## 2015-11-27 DIAGNOSIS — N63 Unspecified lump in breast: Secondary | ICD-10-CM | POA: Diagnosis not present

## 2015-11-27 DIAGNOSIS — Z510 Encounter for antineoplastic radiation therapy: Secondary | ICD-10-CM | POA: Diagnosis not present

## 2015-11-27 DIAGNOSIS — Z720 Tobacco use: Secondary | ICD-10-CM | POA: Diagnosis not present

## 2015-11-27 DIAGNOSIS — Z5111 Encounter for antineoplastic chemotherapy: Secondary | ICD-10-CM | POA: Diagnosis not present

## 2015-11-27 LAB — MAGNESIUM: Magnesium: 2 mg/dl (ref 1.5–2.5)

## 2015-11-27 LAB — CBC WITH DIFFERENTIAL/PLATELET
BASO%: 0.2 % (ref 0.0–2.0)
BASOS ABS: 0 10*3/uL (ref 0.0–0.1)
EOS%: 0.9 % (ref 0.0–7.0)
Eosinophils Absolute: 0 10*3/uL (ref 0.0–0.5)
HEMATOCRIT: 33.3 % — AB (ref 34.8–46.6)
HGB: 11.1 g/dL — ABNORMAL LOW (ref 11.6–15.9)
LYMPH#: 0.3 10*3/uL — AB (ref 0.9–3.3)
LYMPH%: 8.1 % — AB (ref 14.0–49.7)
MCH: 30.1 pg (ref 25.1–34.0)
MCHC: 33.1 g/dL (ref 31.5–36.0)
MCV: 90.8 fL (ref 79.5–101.0)
MONO#: 0.5 10*3/uL (ref 0.1–0.9)
MONO%: 12.7 % (ref 0.0–14.0)
NEUT#: 3.2 10*3/uL (ref 1.5–6.5)
NEUT%: 78.1 % — AB (ref 38.4–76.8)
Platelets: 131 10*3/uL — ABNORMAL LOW (ref 145–400)
RBC: 3.67 10*6/uL — AB (ref 3.70–5.45)
RDW: 20.5 % — ABNORMAL HIGH (ref 11.2–14.5)
WBC: 4.1 10*3/uL (ref 3.9–10.3)

## 2015-11-27 LAB — COMPREHENSIVE METABOLIC PANEL
ALT: 31 U/L (ref 0–55)
AST: 21 U/L (ref 5–34)
Albumin: 3.9 g/dL (ref 3.5–5.0)
Alkaline Phosphatase: 72 U/L (ref 40–150)
Anion Gap: 10 mEq/L (ref 3–11)
BUN: 11.4 mg/dL (ref 7.0–26.0)
CHLORIDE: 106 meq/L (ref 98–109)
CO2: 20 meq/L — AB (ref 22–29)
CREATININE: 0.7 mg/dL (ref 0.6–1.1)
Calcium: 9.4 mg/dL (ref 8.4–10.4)
EGFR: >90 mL/min/{1.73_m2} (ref >90)
GLUCOSE: 80 mg/dL (ref 70–140)
Potassium: 3.8 mEq/L (ref 3.5–5.1)
SODIUM: 136 meq/L (ref 136–145)
TOTAL PROTEIN: 7.2 g/dL (ref 6.4–8.3)

## 2015-11-27 MED ORDER — SODIUM CHLORIDE 0.9 % IV SOLN
40.0000 mg/m2 | Freq: Once | INTRAVENOUS | Status: AC
  Administered 2015-11-27: 70 mg via INTRAVENOUS
  Filled 2015-11-27: qty 70

## 2015-11-27 MED ORDER — SODIUM CHLORIDE 0.9 % IV SOLN
Freq: Once | INTRAVENOUS | Status: AC
  Administered 2015-11-27: 12:00:00 via INTRAVENOUS
  Filled 2015-11-27: qty 5

## 2015-11-27 MED ORDER — DEXTROSE-NACL 5-0.45 % IV SOLN
Freq: Once | INTRAVENOUS | Status: AC
  Administered 2015-11-27: 10:00:00 via INTRAVENOUS
  Filled 2015-11-27: qty 10

## 2015-11-27 MED ORDER — PALONOSETRON HCL INJECTION 0.25 MG/5ML
0.2500 mg | Freq: Once | INTRAVENOUS | Status: AC
  Administered 2015-11-27: 0.25 mg via INTRAVENOUS

## 2015-11-27 MED ORDER — SODIUM CHLORIDE 0.9 % IV SOLN
Freq: Once | INTRAVENOUS | Status: AC
  Administered 2015-11-27: 10:00:00 via INTRAVENOUS

## 2015-11-27 NOTE — Patient Instructions (Signed)
[facility] Discharge Instructions for Patients Receiving Chemotherapy  Today you received the following chemotherapy agents: Cisplatin   To help prevent nausea and vomiting after your treatment, we encourage you to take your nausea medication as directed.    If you develop nausea and vomiting that is not controlled by your nausea medication, call the clinic.   BELOW ARE SYMPTOMS THAT SHOULD BE REPORTED IMMEDIATELY:  *FEVER GREATER THAN 100.5 F  *CHILLS WITH OR WITHOUT FEVER  NAUSEA AND VOMITING THAT IS NOT CONTROLLED WITH YOUR NAUSEA MEDICATION  *UNUSUAL SHORTNESS OF BREATH  *UNUSUAL BRUISING OR BLEEDING  TENDERNESS IN MOUTH AND THROAT WITH OR WITHOUT PRESENCE OF ULCERS  *URINARY PROBLEMS  *BOWEL PROBLEMS  UNUSUAL RASH Items with * indicate a potential emergency and should be followed up as soon as possible.  Feel free to call the clinic you have any questions or concerns. The clinic phone number is [phone].  Please show the CHEMO ALERT CARD at check-in to the Emergency Department and triage nurse.   

## 2015-11-27 NOTE — Progress Notes (Signed)
OFFICE PROGRESS NOTE   November 29, 2015   [place], [place], Angelyn Punt, MD, Gery Pray  INTERVAL HISTORY:  Patient is seen, alone for visit, as she continues treatment for apparent IIIB squamous cell carcinoma of cervix. Definitive IMRT is scheduled thru 12-19-15, with concomitant weekly CDDP for sensitization.  Patient is tolerating treatment well overall. She has had occasional loose stools, including once this AM, which is responding to prn imodium; last imodium prior to this AM was x2 on 11-25-15. She has had occasional nausea also, needed antiemetics x2 in last 2 days, with improvement; she is eating and is able to do full oral prehydration for CDDP. She is tolerating oral ferrous fumarate, has had no bleeding, and is not obviously more symptomatic from present mild anemia. She denies abdominal or pelvic pain. She still has intermittent slight vaginal spotting, no other bleeding. She has had no fever, no increased SOB, no LE swelling. Bladder ok. She has decreased cigarettes to 5 / 24 hours, has found that chewing gum is helpful. No peripheral neuropathy. Remainder of 10 point Review of Systems negative.   No PAC Flu vaccine 09-01-15  ONCOLOGIC HISTORY  Patient has had no regular medical care in years, including no PAP in >15 years. She had no bleeding since menopause until several hours of heavy vaginal bleeding in 04-2015, which resolved other than intermittent spotting until extremely heavy vaginal bleeding again 08-08-15. She was seen in [facility] ED then, with Hgb 8.0; she was given premarin and continued on oral megace x 2 weeks. She was seen by Dr Mallory Shirk on 08-16-15 , with finding of large fungating cervical mass with parametrial involvement. Cervical biopsy 08-17-15 ([id number]) showed moderately to poorly differentiated squamous cell carcinoma. She had CT CAP in [facility] system on 08-21-15, with 1.7 cm oval mass upper outer right breast, multiple prominent  mediastinal lymph nodes and multiiple bilateral pulmonary nodules including a 2.7 x 1.8 cm nodule in RLL., retroperitoneal and external iliac adenopathy, 4.2 x 5.2 x 6.1 cm mass involving cervix and lower uterine segment, 1 cm sclerotic lesion in lateral left fifth rib, liver ok and no ureteral obstruction. She saw Dr Alycia Rossetti on 08-23-15, who agreed with diagnosis of cervical cancer at least IIIB, possibly IV if pulmonary mets. PET pending, recommendation if stage IV to use short course RT to control bleeding then carbo taxol +/- avastin; if PET shows pelvis only then radiation with sensitizing CDDP. Patient saw Dr Sondra Come in consultation on 08-30-15, with vaginal packing required for heavy bleeding. She had urgent radiation thru 09-04-15. PET 09-06-15 had uptake in cervical mass, periaortic and iliac nodes, left supraclavicular node, left subpectoral and axillary nodes, and in right breast mass. Biposy of right breast 09-08-15 benign. Carboplatin taxol was attempted on 09-22-15, with acute, severe taxol reaction after ~ 2 ml taxol had infused, this despite full oral and IV premedication for taxol. Regimen was changed to CDDP gemzar, cycle 1 given 10-06-15 and cycle 2 on 10-19-15. Case was reviewed at multidisciplinary conference, with repeat CT chest favoring reactive nodes over metastatic disease. Decision was made to give definitive radiation with sensitizing CDDP.    Objective:  Vital signs in last 24 hours:  BP 147/69 mmHg  Pulse 86  Temp(Src) 97.9 F (36.6 C) (Oral)  Resp 20  Ht $R'5\' 4"'gU$  (1.626 m)  Wt 149 lb 3.2 oz (67.677 kg)  BMI 25.60 kg/m2  SpO2 100% Weight down 4 lbs Alert, oriented and appropriate. Ambulatory without difficulty. Respirations  not labored RA. Looks comfortable No alopecia  HEENT:PERRL, sclerae not icteric. Oral mucosa moist without lesions, posterior pharynx clear.  Neck supple. No JVD.  Lymphatics:no cervical,supraclavicular or inguinal adenopathy Resp: clear to auscultation  bilaterally and normal percussion bilaterally Cardio: regular rate and rhythm. No gallop. GI: soft, nontender, not distended, no mass or organomegaly. Normally active bowel sounds.  Musculoskeletal/ Extremities: without pitting edema, cords, tenderness Neuro: no significant peripheral neuropathy. Otherwise nonfocal. PSYCH appropriate mood and affect Skin without rash, ecchymosis, petechiae   Lab Results:  Results for orders placed or performed in visit on 11/27/15  CBC with Differential  Result Value Ref Range   WBC 4.1 3.9 - 10.3 10e3/uL   NEUT# 3.2 1.5 - 6.5 10e3/uL   HGB 11.1 (L) 11.6 - 15.9 g/dL   HCT 33.3 (L) 34.8 - 46.6 %   Platelets 131 (L) 145 - 400 10e3/uL   MCV 90.8 79.5 - 101.0 fL   MCH 30.1 25.1 - 34.0 pg   MCHC 33.1 31.5 - 36.0 g/dL   RBC 3.67 (L) 3.70 - 5.45 10e6/uL   RDW 20.5 (H) 11.2 - 14.5 %   lymph# 0.3 (L) 0.9 - 3.3 10e3/uL   MONO# 0.5 0.1 - 0.9 10e3/uL   Eosinophils Absolute 0.0 0.0 - 0.5 10e3/uL   Basophils Absolute 0.0 0.0 - 0.1 10e3/uL   NEUT% 78.1 (H) 38.4 - 76.8 %   LYMPH% 8.1 (L) 14.0 - 49.7 %   MONO% 12.7 0.0 - 14.0 %   EOS% 0.9 0.0 - 7.0 %   BASO% 0.2 0.0 - 2.0 %  Comprehensive metabolic panel  Result Value Ref Range   Sodium 136 136 - 145 mEq/L   Potassium 3.8 3.5 - 5.1 mEq/L   Chloride 106 98 - 109 mEq/L   CO2 20 (L) 22 - 29 mEq/L   Glucose 80 70 - 140 mg/dl   BUN 11.4 7.0 - 26.0 mg/dL   Creatinine 0.7 0.6 - 1.1 mg/dL   Total Bilirubin <0.30 0.20 - 1.20 mg/dL   Alkaline Phosphatase 72 40 - 150 U/L   AST 21 5 - 34 U/L   ALT 31 0 - 55 U/L   Total Protein 7.2 6.4 - 8.3 g/dL   Albumin 3.9 3.5 - 5.0 g/dL   Calcium 9.4 8.4 - 10.4 mg/dL   Anion Gap 10 3 - 11 mEq/L   EGFR >90 >90 ml/min/1.73 m2  Magnesium  Result Value Ref Range   Magnesium 2.0 1.5 - 2.5 mg/dl     Studies/Results:  No results found.  Medications: I have reviewed the patient's current medications.  DISCUSSION Reviewed present rationale and plan for weekly sensitizing  CDDP; patient in agreement with continuing as planned. Discussed radiation diarrhea and she knows to contact rad onc or med onc if imodium 8 tablets/ 24 hr does not control this, in which case she would need lomotil. She knows to push fluids, preferable juices or gatorade if diarrhea.   Smoking cessation discussed again. Note husband is current smoker, likely would be helpful if he would smoke only outside.  Assessment/Plan:  1. Squamous cell carcinoma cervix: moderately to poorly differentiated, initially thought metastic to paraaortic, chest and left supraclavicular nodes. Bleeding initially resolved with short course palliative radiation. After anaphylaxis to taxol, regimen changed to CDDP gemzar, given 10-06-15 and 10-19-15. With uncertainty about staging, repeat chest imaging done 10-24-15, which did not suggest metastatic disease She is now receiving definitive radiation with sensitizing CDDP. Will continue weekly CDDP thru 12-19,  with IMRT scheduled thru 12-15-15. 2.acute anaphylactic reaction to taxol 09-22-15 despite full premedication 3.long and ongoing tobacco, which she is tryiing to stop. Discussed and encouraged again. 4..iron deficiency anemia by iron studies Sept, due to heavy vaginal bleeding: Transfused 2 units PRBCs + 515m feraheme on 09-11-15. Will change oral iron to Hemocyte/ ferrous fumarate and follow.Hemoglobin improved today and in adequate range for the definitive RT. 5..right breast mass upper outer quadrant: biopsy with benign findings, 6 month follow up diagnostic mammogram + [place] ([place] 6. Never colonoscopy 7.post carpal tunnel release 8.sclerotic rib lesion on CT no correlate on PET 9.CAD by CT 10.cholelithiasis by CT 11.abnormal enhancement right renal collecting system by CT 12.peripheral venous access still adequate for chemo 13.[place]ives in [place] but has wanted treatment in [place] to coordinate with radiation. 14.flu  vaccine given 09-01-15   All questions answered and she knows to call if concerns prior to scheduled appointments.. Chemo orders confirmed. She will be treated on 12-04-15 as long as ANC >=1.5, plt >=100k, renal function ok and hydration done. I will see her with treatment on 12-11-15 (cycle 5) which will be last planned CDDP. Time spent 25 min including >50% counseling and coordination of care   Zaccai Chavarin P, MD   11/29/2015, 12:52 PM

## 2015-11-28 ENCOUNTER — Encounter: Payer: Self-pay | Admitting: Radiation Oncology

## 2015-11-28 ENCOUNTER — Ambulatory Visit
Admission: RE | Admit: 2015-11-28 | Discharge: 2015-11-28 | Disposition: A | Discharge status: Home / Self Care | Payer: BLUE CROSS/BLUE SHIELD | Source: Ambulatory Visit | Attending: Radiation Oncology | Admitting: Radiation Oncology

## 2015-11-28 VITALS — BP 156/67 | HR 79 | Temp 98.0°F | Resp 20 | Wt 152.1 lb

## 2015-11-28 DIAGNOSIS — Z510 Encounter for antineoplastic radiation therapy: Secondary | ICD-10-CM | POA: Diagnosis not present

## 2015-11-28 DIAGNOSIS — C539 Malignant neoplasm of cervix uteri, unspecified: Secondary | ICD-10-CM

## 2015-11-28 NOTE — Progress Notes (Signed)
  Radiation Oncology         [phone] ________________________________  Name: Valerie Figueroa MRN: [id number]  Date: 11/28/2015  DOB: September 23, 1956  Weekly Radiation Therapy Management    ICD-9-CM ICD-10-CM   1. Cervical cancer, FIGO stage IVB (HCC) 180.9 C53.9      Current Dose: 21.6 Gy     Planned Dose:  32.4+ Gy  Narrative . . . . . . . . The patient presents for routine under treatment assessment.                                   The patient has had some mild diarrhea controlled with 1-2 Imodium per day. She is also had some nausea control with her medications. Patient continues to receive radiosensitizing chemotherapy. She has very minimal vaginal bleeding at this time. She denies any pelvic pain.                                 Set-up films were reviewed.                                 The chart was checked. Physical Findings. . .  weight is 152 lb 1.6 oz (68.992 kg). Her oral temperature is 98 F (36.7 C). Her blood pressure is 156/67 and her pulse is 79. Her respiration is 20. . Weight essentially stable.  The lungs are clear. The heart has a regular rhythm and rate. The abdomen is soft and nontender with normal bowel sounds. Impression . . . . . . . The patient is tolerating radiation. Plan . . . . . . . . . . . . Continue treatment as planned.  ________________________________   Blair Promise, PhD, MD

## 2015-11-28 NOTE — Progress Notes (Signed)
wekly rad txs pelvis/abd, 12/18 co,pleetd,  had some reddish colored  spotting discharge on tissue this am has stopped, no nausea, diarrhea,  No pain, appetite good, takes imodium if diarrhea occurs again 8:41 AM BP 156/67 mmHg  Pulse 79  Temp(Src) 98 F (36.7 C) (Oral)  Resp 20  Wt 152 lb 1.6 oz (68.992 kg)  Wt Readings from Last 3 Encounters:  11/28/15 152 lb 1.6 oz (68.992 kg)  11/27/15 149 lb 3.2 oz (67.677 kg)  11/21/15 153 lb 9.6 oz (69.673 kg)

## 2015-11-29 ENCOUNTER — Ambulatory Visit
Admission: RE | Admit: 2015-11-29 | Discharge: 2015-11-29 | Disposition: A | Discharge status: Home / Self Care | Payer: BLUE CROSS/BLUE SHIELD | Source: Ambulatory Visit | Attending: Radiation Oncology | Admitting: Radiation Oncology

## 2015-11-29 DIAGNOSIS — K520 Gastroenteritis and colitis due to radiation: Secondary | ICD-10-CM | POA: Insufficient documentation

## 2015-11-29 DIAGNOSIS — Z510 Encounter for antineoplastic radiation therapy: Secondary | ICD-10-CM | POA: Diagnosis not present

## 2015-11-29 DIAGNOSIS — C531 Malignant neoplasm of exocervix: Secondary | ICD-10-CM | POA: Insufficient documentation

## 2015-11-29 DIAGNOSIS — Z716 Tobacco abuse counseling: Secondary | ICD-10-CM | POA: Insufficient documentation

## 2015-11-30 ENCOUNTER — Other Ambulatory Visit: Payer: Self-pay

## 2015-11-30 ENCOUNTER — Ambulatory Visit
Admission: RE | Admit: 2015-11-30 | Discharge: 2015-11-30 | Disposition: A | Discharge status: Home / Self Care | Payer: BLUE CROSS/BLUE SHIELD | Source: Ambulatory Visit | Attending: Radiation Oncology | Admitting: Radiation Oncology

## 2015-11-30 DIAGNOSIS — Z510 Encounter for antineoplastic radiation therapy: Secondary | ICD-10-CM | POA: Diagnosis not present

## 2015-11-30 DIAGNOSIS — C539 Malignant neoplasm of cervix uteri, unspecified: Secondary | ICD-10-CM

## 2015-12-01 ENCOUNTER — Ambulatory Visit
Admission: RE | Admit: 2015-12-01 | Discharge: 2015-12-01 | Disposition: A | Discharge status: Home / Self Care | Payer: BLUE CROSS/BLUE SHIELD | Source: Ambulatory Visit | Attending: Radiation Oncology | Admitting: Radiation Oncology

## 2015-12-01 DIAGNOSIS — Z510 Encounter for antineoplastic radiation therapy: Secondary | ICD-10-CM | POA: Diagnosis not present

## 2015-12-04 ENCOUNTER — Ambulatory Visit: Payer: BLUE CROSS/BLUE SHIELD

## 2015-12-04 ENCOUNTER — Ambulatory Visit
Admission: RE | Admit: 2015-12-04 | Discharge: 2015-12-04 | Disposition: A | Discharge status: Home / Self Care | Payer: BLUE CROSS/BLUE SHIELD | Source: Ambulatory Visit | Attending: Radiation Oncology | Admitting: Radiation Oncology

## 2015-12-04 ENCOUNTER — Other Ambulatory Visit (HOSPITAL_BASED_OUTPATIENT_CLINIC_OR_DEPARTMENT_OTHER): Payer: BLUE CROSS/BLUE SHIELD

## 2015-12-04 ENCOUNTER — Ambulatory Visit (HOSPITAL_BASED_OUTPATIENT_CLINIC_OR_DEPARTMENT_OTHER): Payer: BLUE CROSS/BLUE SHIELD

## 2015-12-04 VITALS — BP 147/59 | HR 89 | Temp 97.0°F | Resp 20

## 2015-12-04 DIAGNOSIS — C539 Malignant neoplasm of cervix uteri, unspecified: Secondary | ICD-10-CM

## 2015-12-04 DIAGNOSIS — Z510 Encounter for antineoplastic radiation therapy: Secondary | ICD-10-CM | POA: Diagnosis not present

## 2015-12-04 DIAGNOSIS — Z5111 Encounter for antineoplastic chemotherapy: Secondary | ICD-10-CM

## 2015-12-04 DIAGNOSIS — C531 Malignant neoplasm of exocervix: Secondary | ICD-10-CM

## 2015-12-04 LAB — COMPREHENSIVE METABOLIC PANEL
ALT: 35 U/L (ref 0–55)
ANION GAP: 11 meq/L (ref 3–11)
AST: 25 U/L (ref 5–34)
Albumin: 3.8 g/dL (ref 3.5–5.0)
Alkaline Phosphatase: 72 U/L (ref 40–150)
BUN: 11.6 mg/dL (ref 7.0–26.0)
CALCIUM: 9.3 mg/dL (ref 8.4–10.4)
CO2: 21 mEq/L — ABNORMAL LOW (ref 22–29)
CREATININE: 0.7 mg/dL (ref 0.6–1.1)
Chloride: 104 mEq/L (ref 98–109)
Glucose: 91 mg/dl (ref 70–140)
Potassium: 3.5 mEq/L (ref 3.5–5.1)
Sodium: 136 mEq/L (ref 136–145)
TOTAL PROTEIN: 7.1 g/dL (ref 6.4–8.3)

## 2015-12-04 LAB — CBC WITH DIFFERENTIAL/PLATELET
BASO%: 0 % (ref 0.0–2.0)
Basophils Absolute: 0 10*3/uL (ref 0.0–0.1)
EOS ABS: 0 10*3/uL (ref 0.0–0.5)
EOS%: 0.3 % (ref 0.0–7.0)
HEMATOCRIT: 29.6 % — AB (ref 34.8–46.6)
HGB: 10.2 g/dL — ABNORMAL LOW (ref 11.6–15.9)
LYMPH#: 0.2 10*3/uL — AB (ref 0.9–3.3)
LYMPH%: 4.4 % — AB (ref 14.0–49.7)
MCH: 31.5 pg (ref 25.1–34.0)
MCHC: 34.5 g/dL (ref 31.5–36.0)
MCV: 91.4 fL (ref 79.5–101.0)
MONO#: 0.4 10*3/uL (ref 0.1–0.9)
MONO%: 12.3 % (ref 0.0–14.0)
NEUT%: 83 % — AB (ref 38.4–76.8)
NEUTROS ABS: 2.8 10*3/uL (ref 1.5–6.5)
PLATELETS: 111 10*3/uL — AB (ref 145–400)
RBC: 3.24 10*6/uL — ABNORMAL LOW (ref 3.70–5.45)
RDW: 19.7 % — ABNORMAL HIGH (ref 11.2–14.5)
WBC: 3.4 10*3/uL — ABNORMAL LOW (ref 3.9–10.3)

## 2015-12-04 LAB — MAGNESIUM: MAGNESIUM: 2 mg/dL (ref 1.5–2.5)

## 2015-12-04 MED ORDER — SODIUM CHLORIDE 0.9 % IV SOLN
40.0000 mg/m2 | Freq: Once | INTRAVENOUS | Status: AC
  Administered 2015-12-04: 70 mg via INTRAVENOUS
  Filled 2015-12-04: qty 70

## 2015-12-04 MED ORDER — SODIUM CHLORIDE 0.9 % IV SOLN
Freq: Once | INTRAVENOUS | Status: AC
  Administered 2015-12-04: 11:00:00 via INTRAVENOUS
  Filled 2015-12-04: qty 5

## 2015-12-04 MED ORDER — SODIUM CHLORIDE 0.9 % IV SOLN
Freq: Once | INTRAVENOUS | Status: AC
  Administered 2015-12-04: 10:00:00 via INTRAVENOUS

## 2015-12-04 MED ORDER — PALONOSETRON HCL INJECTION 0.25 MG/5ML
INTRAVENOUS | Status: AC
  Filled 2015-12-04: qty 5

## 2015-12-04 MED ORDER — DEXTROSE-NACL 5-0.45 % IV SOLN
Freq: Once | INTRAVENOUS | Status: AC
  Administered 2015-12-04: 10:00:00 via INTRAVENOUS
  Filled 2015-12-04: qty 10

## 2015-12-04 MED ORDER — PALONOSETRON HCL INJECTION 0.25 MG/5ML
0.2500 mg | Freq: Once | INTRAVENOUS | Status: AC
  Administered 2015-12-04: 0.25 mg via INTRAVENOUS

## 2015-12-04 NOTE — Patient Instructions (Signed)
[facility] Discharge Instructions for Patients Receiving Chemotherapy  Today you received the following chemotherapy agents: Cisplatin   To help prevent nausea and vomiting after your treatment, we encourage you to take your nausea medication as directed.    If you develop nausea and vomiting that is not controlled by your nausea medication, call the clinic.   BELOW ARE SYMPTOMS THAT SHOULD BE REPORTED IMMEDIATELY:  *FEVER GREATER THAN 100.5 F  *CHILLS WITH OR WITHOUT FEVER  NAUSEA AND VOMITING THAT IS NOT CONTROLLED WITH YOUR NAUSEA MEDICATION  *UNUSUAL SHORTNESS OF BREATH  *UNUSUAL BRUISING OR BLEEDING  TENDERNESS IN MOUTH AND THROAT WITH OR WITHOUT PRESENCE OF ULCERS  *URINARY PROBLEMS  *BOWEL PROBLEMS  UNUSUAL RASH Items with * indicate a potential emergency and should be followed up as soon as possible.  Feel free to call the clinic you have any questions or concerns. The clinic phone number is [phone].  Please show the CHEMO ALERT CARD at check-in to the Emergency Department and triage nurse.   

## 2015-12-05 ENCOUNTER — Ambulatory Visit
Admission: RE | Admit: 2015-12-05 | Discharge: 2015-12-05 | Disposition: A | Discharge status: Home / Self Care | Payer: BLUE CROSS/BLUE SHIELD | Source: Ambulatory Visit | Attending: Radiation Oncology | Admitting: Radiation Oncology

## 2015-12-05 ENCOUNTER — Ambulatory Visit: Payer: BLUE CROSS/BLUE SHIELD

## 2015-12-05 ENCOUNTER — Encounter: Payer: Self-pay | Admitting: Radiation Oncology

## 2015-12-05 VITALS — BP 166/79 | HR 75 | Temp 98.0°F | Resp 16 | Ht 64.0 in | Wt 150.7 lb

## 2015-12-05 DIAGNOSIS — C539 Malignant neoplasm of cervix uteri, unspecified: Secondary | ICD-10-CM

## 2015-12-05 DIAGNOSIS — Z510 Encounter for antineoplastic radiation therapy: Secondary | ICD-10-CM | POA: Diagnosis not present

## 2015-12-05 NOTE — Progress Notes (Signed)
  Radiation Oncology         (336) [phone] ________________________________  Name: Valerie Figueroa MRN: [id number]  Date: 12/05/2015  DOB: Apr 27, 1956  Weekly Radiation Therapy Management    ICD-9-CM ICD-10-CM   1. Cervical cancer, FIGO stage IVB (HCC) 180.9 C53.9      Current Dose: 30.6 Gy     Planned Dose:  32.4+ Gy  Narrative . . . . . . . . The patient presents for routine under treatment assessment.                                   The patient is without complaint. Scant vaginal spotting. She denies any pelvic pain. Chemotherapy earlier this week and tolerated this well. No bladder issues or rectal irritation.                                 Set-up films were reviewed.                                 The chart was checked. Physical Findings. . .  height is '5\' 4"'$  (1.626 m) and weight is 150 lb 11.2 oz (68.357 kg). Her oral temperature is 98 F (36.7 C). Her blood pressure is 166/79 and her pulse is 75. Her respiration is 16. . Weight essentially stable.  No significant changes.  Lungs are clear. The heart has a regular rhythm and rate. The abdomen is soft and nontender with normal bowel sounds. Impression . . . . . . . The patient is tolerating radiation. Plan . . . . . . . . . . . . Continue treatment as planned.  ________________________________   Blair Promise, PhD, MD

## 2015-12-05 NOTE — Progress Notes (Signed)
Valerie Figueroa has completed 17 fractions.  She denies pain.  She reports having diarrhea 3-4 times daily and takes Imodium as needed.  She reports having vaginal spotting that is dark in color.  She denies having any bladder issues and skin changes.  She reports having fatigue.  She had chemotherapy yesterday.   BP 166/79 mmHg  Pulse 75  Temp(Src) 98 F (36.7 C) (Oral)  Resp 16  Ht '5\' 4"'$  (1.626 m)  Wt 150 lb 11.2 oz (68.357 kg)  BMI 25.85 kg/m2   Wt Readings from Last 3 Encounters:  12/05/15 150 lb 11.2 oz (68.357 kg)  11/28/15 152 lb 1.6 oz (68.992 kg)  11/27/15 149 lb 3.2 oz (67.677 kg)

## 2015-12-06 ENCOUNTER — Ambulatory Visit
Admission: RE | Admit: 2015-12-06 | Discharge: 2015-12-06 | Disposition: A | Discharge status: Home / Self Care | Payer: BLUE CROSS/BLUE SHIELD | Source: Ambulatory Visit | Attending: Radiation Oncology | Admitting: Radiation Oncology

## 2015-12-06 DIAGNOSIS — Z510 Encounter for antineoplastic radiation therapy: Secondary | ICD-10-CM | POA: Diagnosis not present

## 2015-12-07 ENCOUNTER — Ambulatory Visit
Admission: RE | Admit: 2015-12-07 | Discharge: 2015-12-07 | Disposition: A | Discharge status: Home / Self Care | Payer: BLUE CROSS/BLUE SHIELD | Source: Ambulatory Visit | Attending: Radiation Oncology | Admitting: Radiation Oncology

## 2015-12-07 DIAGNOSIS — Z510 Encounter for antineoplastic radiation therapy: Secondary | ICD-10-CM | POA: Diagnosis not present

## 2015-12-08 ENCOUNTER — Ambulatory Visit
Admission: RE | Admit: 2015-12-08 | Discharge: 2015-12-08 | Disposition: A | Discharge status: Home / Self Care | Payer: BLUE CROSS/BLUE SHIELD | Source: Ambulatory Visit | Attending: Radiation Oncology | Admitting: Radiation Oncology

## 2015-12-08 DIAGNOSIS — Z510 Encounter for antineoplastic radiation therapy: Secondary | ICD-10-CM | POA: Diagnosis not present

## 2015-12-10 ENCOUNTER — Other Ambulatory Visit: Payer: Self-pay | Admitting: Oncology

## 2015-12-10 DIAGNOSIS — C539 Malignant neoplasm of cervix uteri, unspecified: Secondary | ICD-10-CM

## 2015-12-11 ENCOUNTER — Ambulatory Visit
Admission: RE | Admit: 2015-12-11 | Discharge: 2015-12-11 | Disposition: A | Discharge status: Home / Self Care | Payer: BLUE CROSS/BLUE SHIELD | Source: Ambulatory Visit | Attending: Radiation Oncology | Admitting: Radiation Oncology

## 2015-12-11 ENCOUNTER — Other Ambulatory Visit (HOSPITAL_BASED_OUTPATIENT_CLINIC_OR_DEPARTMENT_OTHER): Payer: BLUE CROSS/BLUE SHIELD

## 2015-12-11 ENCOUNTER — Telehealth: Payer: Self-pay

## 2015-12-11 ENCOUNTER — Other Ambulatory Visit: Payer: Self-pay | Admitting: Radiation Oncology

## 2015-12-11 ENCOUNTER — Encounter: Payer: Self-pay | Admitting: Oncology

## 2015-12-11 ENCOUNTER — Ambulatory Visit (HOSPITAL_BASED_OUTPATIENT_CLINIC_OR_DEPARTMENT_OTHER): Payer: BLUE CROSS/BLUE SHIELD | Admitting: Oncology

## 2015-12-11 ENCOUNTER — Ambulatory Visit (HOSPITAL_COMMUNITY)
Admission: RE | Admit: 2015-12-11 | Discharge: 2015-12-11 | Disposition: A | Discharge status: Home / Self Care | Payer: BLUE CROSS/BLUE SHIELD | Source: Ambulatory Visit | Attending: Oncology | Admitting: Oncology

## 2015-12-11 ENCOUNTER — Ambulatory Visit: Payer: BLUE CROSS/BLUE SHIELD | Admitting: Nutrition

## 2015-12-11 ENCOUNTER — Telehealth: Payer: Self-pay | Admitting: Oncology

## 2015-12-11 ENCOUNTER — Ambulatory Visit (HOSPITAL_BASED_OUTPATIENT_CLINIC_OR_DEPARTMENT_OTHER): Payer: BLUE CROSS/BLUE SHIELD

## 2015-12-11 ENCOUNTER — Ambulatory Visit: Payer: BLUE CROSS/BLUE SHIELD

## 2015-12-11 VITALS — BP 134/73 | HR 72 | Temp 98.6°F | Resp 18

## 2015-12-11 VITALS — BP 150/56 | HR 90 | Temp 97.9°F | Resp 18 | Ht 64.0 in | Wt 147.1 lb

## 2015-12-11 DIAGNOSIS — C539 Malignant neoplasm of cervix uteri, unspecified: Secondary | ICD-10-CM

## 2015-12-11 DIAGNOSIS — Z510 Encounter for antineoplastic radiation therapy: Secondary | ICD-10-CM | POA: Diagnosis not present

## 2015-12-11 DIAGNOSIS — D500 Iron deficiency anemia secondary to blood loss (chronic): Secondary | ICD-10-CM | POA: Diagnosis not present

## 2015-12-11 DIAGNOSIS — Z720 Tobacco use: Secondary | ICD-10-CM

## 2015-12-11 DIAGNOSIS — T451X5A Adverse effect of antineoplastic and immunosuppressive drugs, initial encounter: Secondary | ICD-10-CM

## 2015-12-11 DIAGNOSIS — D72819 Decreased white blood cell count, unspecified: Secondary | ICD-10-CM

## 2015-12-11 DIAGNOSIS — D701 Agranulocytosis secondary to cancer chemotherapy: Secondary | ICD-10-CM

## 2015-12-11 DIAGNOSIS — K520 Gastroenteritis and colitis due to radiation: Secondary | ICD-10-CM | POA: Diagnosis not present

## 2015-12-11 LAB — COMPREHENSIVE METABOLIC PANEL
ALBUMIN: 3.7 g/dL (ref 3.5–5.0)
ALK PHOS: 75 U/L (ref 40–150)
ALT: 46 U/L (ref 0–55)
AST: 33 U/L (ref 5–34)
Anion Gap: 10 mEq/L (ref 3–11)
BUN: 12 mg/dL (ref 7.0–26.0)
CO2: 21 mEq/L — ABNORMAL LOW (ref 22–29)
CREATININE: 0.7 mg/dL (ref 0.6–1.1)
Calcium: 9.2 mg/dL (ref 8.4–10.4)
Chloride: 103 mEq/L (ref 98–109)
EGFR: 87 mL/min/{1.73_m2} — ABNORMAL LOW (ref >90)
GLUCOSE: 95 mg/dL (ref 70–140)
Potassium: 4 mEq/L (ref 3.5–5.1)
SODIUM: 133 meq/L — AB (ref 136–145)
TOTAL PROTEIN: 6.8 g/dL (ref 6.4–8.3)

## 2015-12-11 LAB — CBC WITH DIFFERENTIAL/PLATELET
BASO%: 0 % (ref 0.0–2.0)
BASOS ABS: 0 10*3/uL (ref 0.0–0.1)
EOS%: 0 % (ref 0.0–7.0)
Eosinophils Absolute: 0 10*3/uL (ref 0.0–0.5)
HCT: 25.3 % — ABNORMAL LOW (ref 34.8–46.6)
HEMOGLOBIN: 8.5 g/dL — AB (ref 11.6–15.9)
LYMPH#: 0.3 10*3/uL — AB (ref 0.9–3.3)
LYMPH%: 18.4 % (ref 14.0–49.7)
MCH: 31.1 pg (ref 25.1–34.0)
MCHC: 33.6 g/dL (ref 31.5–36.0)
MCV: 92.7 fL (ref 79.5–101.0)
MONO#: 0.4 10*3/uL (ref 0.1–0.9)
MONO%: 20.1 % — ABNORMAL HIGH (ref 0.0–14.0)
NEUT%: 61.5 % (ref 38.4–76.8)
NEUTROS ABS: 1.1 10*3/uL — AB (ref 1.5–6.5)
NRBC: 1 % — AB (ref 0–0)
Platelets: 136 10*3/uL — ABNORMAL LOW (ref 145–400)
RBC: 2.73 10*6/uL — ABNORMAL LOW (ref 3.70–5.45)
RDW: 20.8 % — AB (ref 11.2–14.5)
WBC: 1.8 10*3/uL — ABNORMAL LOW (ref 3.9–10.3)

## 2015-12-11 LAB — MAGNESIUM: Magnesium: 2.1 mg/dl (ref 1.5–2.5)

## 2015-12-11 LAB — PREPARE RBC (CROSSMATCH)

## 2015-12-11 MED ORDER — SODIUM CHLORIDE 0.9 % IV SOLN
250.0000 mL | Freq: Once | INTRAVENOUS | Status: AC
Start: 2015-12-11 — End: 2015-12-11
  Administered 2015-12-11: 250 mL via INTRAVENOUS

## 2015-12-11 MED ORDER — ACETAMINOPHEN 325 MG PO TABS
ORAL_TABLET | ORAL | Status: AC
  Filled 2015-12-11: qty 2

## 2015-12-11 MED ORDER — ACETAMINOPHEN 325 MG PO TABS
325.0000 mg | ORAL_TABLET | Freq: Once | ORAL | Status: AC
  Administered 2015-12-11: 325 mg via ORAL

## 2015-12-11 NOTE — Telephone Encounter (Signed)
Called and made sure she had zofran refills available.

## 2015-12-11 NOTE — Patient Instructions (Signed)
Blood Transfusion Information WHAT IS A BLOOD TRANSFUSION? A transfusion is the replacement of blood or some of its parts. Blood is made up of multiple cells which provide different functions.  Red blood cells carry oxygen and are used for blood loss replacement.  White blood cells fight against infection.  Platelets control bleeding.  Plasma helps clot blood.  Other blood products are available for specialized needs, such as hemophilia or other clotting disorders. BEFORE THE TRANSFUSION  Who gives blood for transfusions?   You may be able to donate blood to be used at a later date on yourself (autologous donation).  Relatives can be asked to donate blood. This is generally not any safer than if you have received blood from a stranger. The same precautions are taken to ensure safety when a relative's blood is donated.  Healthy volunteers who are fully evaluated to make sure their blood is safe. This is blood bank blood. Transfusion therapy is the safest it has ever been in the practice of medicine. Before blood is taken from a donor, a complete history is taken to make sure that person has no history of diseases nor engages in risky social behavior (examples are intravenous drug use or sexual activity with multiple partners). The donor's travel history is screened to minimize risk of transmitting infections, such as malaria. The donated blood is tested for signs of infectious diseases, such as HIV and hepatitis. The blood is then tested to be sure it is compatible with you in order to minimize the chance of a transfusion reaction. If you or a relative donates blood, this is often done in anticipation of surgery and is not appropriate for emergency situations. It takes many days to process the donated blood. RISKS AND COMPLICATIONS Although transfusion therapy is very safe and saves many lives, the main dangers of transfusion include:   Getting an infectious disease.  Developing a  transfusion reaction. This is an allergic reaction to something in the blood you were given. Every precaution is taken to prevent this. The decision to have a blood transfusion has been considered carefully by your caregiver before blood is given. Blood is not given unless the benefits outweigh the risks. AFTER THE TRANSFUSION  Right after receiving a blood transfusion, you will usually feel much better and more energetic. This is especially true if your red blood cells have gotten low (anemic). The transfusion raises the level of the red blood cells which carry oxygen, and this usually causes an energy increase.  The nurse administering the transfusion will monitor you carefully for complications. HOME CARE INSTRUCTIONS  No special instructions are needed after a transfusion. You may find your energy is better. Speak with your caregiver about any limitations on activity for underlying diseases you may have. SEEK MEDICAL CARE IF:   Your condition is not improving after your transfusion.  You develop redness or irritation at the intravenous (IV) site. SEEK IMMEDIATE MEDICAL CARE IF:  Any of the following symptoms occur over the next 12 hours:  Shaking chills.  You have a temperature by mouth above 102 F (38.9 C), not controlled by medicine.  Chest, back, or muscle pain.  People around you feel you are not acting correctly or are confused.  Shortness of breath or difficulty breathing.  Dizziness and fainting.  You get a rash or develop hives.  You have a decrease in urine output.  Your urine turns a dark color or changes to pink, red, or brown. Any of the following   symptoms occur over the next 10 days:  You have a temperature by mouth above 102 F (38.9 C), not controlled by medicine.  Shortness of breath.  Weakness after normal activity.  The white part of the eye turns yellow (jaundice).  You have a decrease in the amount of urine or are urinating less often.  Your  urine turns a dark color or changes to pink, red, or brown. Document Released: 12/06/2000 Document Revised: 03/02/2012 Document Reviewed: 07/25/2008 ExitCare Patient Information 2015 [employer]. This information is not intended to replace advice given to you by your health care provider. Make sure you discuss any questions you have with your health care provider.  Ferumoxytol injection What is this medicine? FERUMOXYTOL is an iron complex. Iron is used to make healthy red blood cells, which carry oxygen and nutrients throughout the body. This medicine is used to treat iron deficiency anemia in people with chronic kidney disease. This medicine may be used for other purposes; ask your health care provider or pharmacist if you have questions. COMMON BRAND NAME(S): Feraheme What should I tell my health care provider before I take this medicine? They need to know if you have any of these conditions: -anemia not caused by low iron levels -high levels of iron in the blood -magnetic resonance imaging (MRI) test scheduled -an unusual or allergic reaction to iron, other medicines, foods, dyes, or preservatives -pregnant or trying to get pregnant -breast-feeding How should I use this medicine? This medicine is for injection into a vein. It is given by a health care professional in a hospital or clinic setting. Talk to your pediatrician regarding the use of this medicine in children. Special care may be needed. Overdosage: If you think you've taken too much of this medicine contact a poison control center or emergency room at once. Overdosage: If you think you have taken too much of this medicine contact a poison control center or emergency room at once. NOTE: This medicine is only for you. Do not share this medicine with others. What if I miss a dose? It is important not to miss your dose. Call your doctor or health care professional if you are unable to keep an appointment. What may interact with  this medicine? This medicine may interact with the following medications: -other iron products This list may not describe all possible interactions. Give your health care provider a list of all the medicines, herbs, non-prescription drugs, or dietary supplements you use. Also tell them if you smoke, drink alcohol, or use illegal drugs. Some items may interact with your medicine. What should I watch for while using this medicine? Visit your doctor or healthcare professional regularly. Tell your doctor or healthcare professional if your symptoms do not start to get better or if they get worse. You may need blood work done while you are taking this medicine. You may need to follow a special diet. Talk to your doctor. Foods that contain iron include: whole grains/cereals, dried fruits, beans, or peas, leafy green vegetables, and organ meats (liver, kidney). What side effects may I notice from receiving this medicine? Side effects that you should report to your doctor or health care professional as soon as possible: -allergic reactions like skin rash, itching or hives, swelling of the face, lips, or tongue -breathing problems -changes in blood pressure -feeling faint or lightheaded, falls -fever or chills -flushing, sweating, or hot feelings -swelling of the ankles or feet Side effects that usually do not require medical attention (Report these   to your doctor or health care professional if they continue or are bothersome.): -diarrhea -headache -nausea, vomiting -stomach pain This list may not describe all possible side effects. Call your doctor for medical advice about side effects. You may report side effects to FDA at 1-800-FDA-1088. Where should I keep my medicine? This drug is given in a hospital or clinic and will not be stored at home. NOTE: This sheet is a summary. It may not cover all possible information. If you have questions about this medicine, talk to your doctor, pharmacist, or  health care provider.  2015, Elsevier/Gold Standard. (2012-07-24 15:23:36)   

## 2015-12-11 NOTE — Telephone Encounter (Signed)
Appointments made and avs pritned

## 2015-12-11 NOTE — Progress Notes (Signed)
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 Counts are too low today for #6 weekly CDDP, including ANC 1.1 and hemoglobin 8.5. (chemo synopsis count not correct due to careplan change).  Per my communication with radiation oncology now, will transfuse 1 unit PRBCs to improve response to radiation.  Patient is fatigued, tho not SOB at rest and no cardiac symptoms. She has intermittent diarrhea consistent with radiation colitis, imodium helpful prn including this AM. She has had more nausea, used antiemetics at least x3 thru weekend and vomited x1 yesterday; she cannot tolerate usual diet, did keep down mashed potatoes and chicken soup, is drinking water. [place] nutritionist gave information following visit today, as husband does cooking. She has "barely any" vaginal spotting, no other bleeding. She is decreasing smoking, chewing gum helpful. She denies LE swelling, increased SOB, fever or symptoms of infection. No increased peripheral neuropathy. Has sitz bath available if irritated from diarrhea, not significant now. No skin irritation in radiation field otherwise. Remainder of 10 point Review of System negative/ unchanged.   No PAC Flu vaccine 09-01-15  ONCOLOGIC HISTORY Patient has had no regular medical care in years, including no PAP in >15 years. She had no bleeding since menopause until several hours of heavy vaginal bleeding in 04-2015, which resolved other than intermittent spotting until extremely heavy vaginal bleeding again 08-08-15. She was seen in [facility] ED then, with Hgb 8.0; she was given premarin and continued on oral megace x 2 weeks. She was seen by Dr Mallory Shirk on 08-16-15 , with finding of large fungating cervical mass with  parametrial involvement. Cervical biopsy 08-17-15 ([id number]) showed moderately to poorly differentiated squamous cell carcinoma. She had CT CAP in [facility] system on 08-21-15, with 1.7 cm oval mass upper outer right breast, multiple prominent mediastinal lymph nodes and multiiple bilateral pulmonary nodules including a 2.7 x 1.8 cm nodule in RLL., retroperitoneal and external iliac adenopathy, 4.2 x 5.2 x 6.1 cm mass involving cervix and lower uterine segment, 1 cm sclerotic lesion in lateral left fifth rib, liver ok and no ureteral obstruction. She saw Dr Alycia Rossetti on 08-23-15, who agreed with diagnosis of cervical cancer at least IIIB, possibly IV if pulmonary mets. PET pending, recommendation if stage IV to use short course RT to control bleeding then carbo taxol +/- avastin; if PET shows pelvis only then radiation with sensitizing CDDP. Patient saw Dr Sondra Come in consultation on 08-30-15, with vaginal packing required for heavy bleeding. She had urgent radiation thru 09-04-15. PET 09-06-15 had uptake in cervical mass, periaortic and iliac nodes, left supraclavicular node, left subpectoral and axillary nodes, and in right breast mass. Biposy of right breast 09-08-15 benign. Carboplatin taxol was attempted on 09-22-15, with acute, severe taxol reaction after ~ 2 ml taxol had infused, this despite full oral and IV premedication for taxol. Regimen was changed to CDDP gemzar, cycle 1 given 10-06-15 and cycle 2 on 10-19-15. Case was reviewed at multidisciplinary conference, with repeat CT chest favoring reactive nodes over metastatic disease. Decision was made to give definitive radiation with sensitizing CDDP.    Objective:  Vital signs in last 24 hours:  BP 150/56 mmHg  Pulse 90  Temp(Src) 97.9 F (36.6 C) (Oral)  Resp 18  Ht '5\' 4"'$  (1.626  m)  Wt 147 lb 1.6 oz (66.724 kg)  BMI 25.24 kg/m2  SpO2 100% Weight down 4 lbs. Alert, oriented and appropriate. Ambulatory without assistance .  No  alopecia  HEENT:PERRL, sclerae not icteric. Oral mucosa moist without lesions, posterior pharynx clear.  Neck supple. No JVD.  Lymphatics:no cervical,supraclavicular or inguinal adenopathy Resp: clear to auscultation bilaterally and normal percussion bilaterally Cardio: regular rate and rhythm. No gallop. GI: soft, nontender, not distended, no mass or organomegaly. Normally active bowel sounds. Surgical incision not remarkable. Musculoskeletal/ Extremities: without pitting edema, cords, tenderness Neuro: no peripheral neuropathy. Otherwise nonfocal Skin without rash, ecchymosis, petechiae   Lab Results:  Results for orders placed or performed in visit on 12/11/15  CBC with Differential  Result Value Ref Range   WBC 1.8 (L) 3.9 - 10.3 10e3/uL   NEUT# 1.1 (L) 1.5 - 6.5 10e3/uL   HGB 8.5 (L) 11.6 - 15.9 g/dL   HCT 25.3 (L) 34.8 - 46.6 %   Platelets 136 (L) 145 - 400 10e3/uL   MCV 92.7 79.5 - 101.0 fL   MCH 31.1 25.1 - 34.0 pg   MCHC 33.6 31.5 - 36.0 g/dL   RBC 2.73 (L) 3.70 - 5.45 10e6/uL   RDW 20.8 (H) 11.2 - 14.5 %   lymph# 0.3 (L) 0.9 - 3.3 10e3/uL   MONO# 0.4 0.1 - 0.9 10e3/uL   Eosinophils Absolute 0.0 0.0 - 0.5 10e3/uL   Basophils Absolute 0.0 0.0 - 0.1 10e3/uL   NEUT% 61.5 38.4 - 76.8 %   LYMPH% 18.4 14.0 - 49.7 %   MONO% 20.1 (H) 0.0 - 14.0 %   EOS% 0.0 0.0 - 7.0 %   BASO% 0.0 0.0 - 2.0 %   nRBC 1 (H) 0 - 0 %     Studies/Results:  No results found.  Medications: I have reviewed the patient's current medications. Refill antiemetics. Since no aloxi with chemo today, ok to use zofran as needed, this the most helpful for her.  DISCUSSION  Not quite neutropenic tho [place] too low for CDDP now. Neulasta is only gCSF preparation approved for her now, so I will recheck CBC later this week, may need granix if that can be approved. Radiation to pelvis + cumulative chemo is causing cytopenias.  Prefer hemoglobin >=~ 10 during radiation for this indication. Patient agrees to 1  unit PRBCs now and will recheck CBC as above.  Discussed diet in setting of treatment nausea and diarrhea, with written information on bland diet given by [facility] nutritionist to share with husband. Push po fluids. WIll give additional IVF + IV antiemetics with PRBCs today.  Assessment/Plan:  1. Squamous cell carcinoma cervix: moderately to poorly differentiated, initially thought metastic to paraaortic, chest and left supraclavicular nodes. Bleeding initially resolved with short course palliative radiation, then began chemotherapy. After anaphylaxis to taxol, regimen changed to CDDP gemzar, given 10-06-15 and 10-19-15. Repeat chest imaging 10-24-15 did not suggest metastatic disease, such that she has proceeded with definitive radiation with sensitizing CDDP. Counts too low for cycle 6 sensitizing CDDP. IMRT to complete 12-30. PRBCs, follow labs,, and I will see her again 01-08-16. 2.acute anaphylactic reaction to taxol 09-22-15 despite full premedication 3.long and ongoing tobacco, which she is tryiing to stop. Discussed and encouraged again. 4..iron deficiency anemia by iron studies Sept, due to heavy vaginal bleeding: Transfused 2 units PRBCs + '510mg'$  feraheme on 09-11-15. Hemoglobin down below optimal for radiation now, despite oral iron, so transfuse 1 unit PRBCs today and follow up  counts later this week.. 5..right breast mass upper outer quadrant: biopsy with benign findings, 6 month follow up diagnostic mammogram + [place] recommended (March) 6. Radiation diarrhea ongoing prn imodium and diet adjustments. Never colonoscopy 7.post carpal tunnel release 8.sclerotic rib lesion on CT no correlate on PET 9.CAD by CT 10.cholelithiasis by CT 11.abnormal enhancement right renal collecting system by CT 12.peripheral venous access still adequate for chemo 13.Lives in [place], but has wanted treatment in [place], which will be easiest to coordinate with radiation. 14.flu vaccine given  09-01-15    All questions answered. Transfusion orders placed, infusion aware of change in plans. Appreciate dietician assisting today. Message to RN to follow up CBC later this week. Neulasta DCd, granix requested in case needed later this week, message to managed care. Time spent 35 min including >50% counseling and coordination of care.  LIVESAY,LENNIS P, MD   12/11/2015, 9:10 AM

## 2015-12-11 NOTE — Telephone Encounter (Signed)
-----   Message from Gordy Levan, MD sent at 12/11/2015  9:43 AM EST ----- Please be sure all antiemetics have refills  thanks

## 2015-12-11 NOTE — Progress Notes (Signed)
Request received to speak with patient and husband regarding therapeutic diet for nausea and diarrhea.  59 year old female diagnosed with cervical cancer receiving chemotherapy and radiation treatments.  She is a patient of Dr. Marko Plume.  Past medical history includes anemia and radiation treatments.  Medications include Imodium, Ativan, multivitamin, Zofran, MiraLAX.  Labs include sodium 133, and albumin 3.7.  Height: 64 inches. Weight: 147.1 pounds December 19 Usual body weight: 152 pounds. BMI: 25.24.  I spoke with patient and husband in the chemotherapy room.   Patient reports she has had episodes of nausea and diarrhea. Reports husband enjoys cooking foods with lots of seasoning. Endorses 5 pound weight loss. States she took Imodium after loose stools.  Noted diarrhea 3-4 times daily.  Nutrition diagnosis:  Unintended weight loss related to nausea and diarrhea as evidenced by 5 pound weight loss over 2 weeks.  Intervention:  Educated patient on the importance of small frequent meals and snacks consisting of bland, low fiber foods. Reviewed well-tolerated protein sources for patient to choose at meals and snacks. Discouraged spicy and greasy foods. Provided fact sheets on nausea and vomiting and diarrhea. Questions were answered.  Teach back method used.  Contact information was provided.  Monitoring, evaluation, goals: Patient will tolerate increased calories and protein to minimize weight loss and improve nutrition impact symptoms.  Next visit: To be scheduled as needed.  **Disclaimer: This note was dictated with voice recognition software. Similar sounding words can inadvertently be transcribed and this note may contain transcription errors which may not have been corrected upon publication of note.**

## 2015-12-11 NOTE — Progress Notes (Signed)
Pt will not receive chemo today per Dr. Marko Plume. Will get 1unit prbc instead. Pt aware and understands plan of care.

## 2015-12-12 ENCOUNTER — Encounter: Payer: Self-pay | Admitting: Oncology

## 2015-12-12 ENCOUNTER — Encounter: Payer: Self-pay | Admitting: Radiation Oncology

## 2015-12-12 ENCOUNTER — Ambulatory Visit
Admission: RE | Admit: 2015-12-12 | Discharge: 2015-12-12 | Disposition: A | Discharge status: Home / Self Care | Payer: BLUE CROSS/BLUE SHIELD | Source: Ambulatory Visit | Attending: Radiation Oncology | Admitting: Radiation Oncology

## 2015-12-12 VITALS — BP 152/84 | HR 79 | Temp 98.0°F | Resp 20 | Wt 146.7 lb

## 2015-12-12 DIAGNOSIS — D701 Agranulocytosis secondary to cancer chemotherapy: Secondary | ICD-10-CM | POA: Insufficient documentation

## 2015-12-12 DIAGNOSIS — Z510 Encounter for antineoplastic radiation therapy: Secondary | ICD-10-CM | POA: Diagnosis not present

## 2015-12-12 DIAGNOSIS — T451X5A Adverse effect of antineoplastic and immunosuppressive drugs, initial encounter: Secondary | ICD-10-CM

## 2015-12-12 DIAGNOSIS — C539 Malignant neoplasm of cervix uteri, unspecified: Secondary | ICD-10-CM | POA: Insufficient documentation

## 2015-12-12 LAB — TYPE AND SCREEN
ABO/RH(D): O NEG
Antibody Screen: NEGATIVE
Unit division: 0

## 2015-12-12 NOTE — Progress Notes (Signed)
  Radiation Oncology         [phone] ________________________________  Name: Valerie Figueroa MRN: [id number]  Date: 12/12/2015  DOB: 1956/05/25  Weekly Radiation Therapy Management    ICD-9-CM ICD-10-CM   1. Malignant neoplasm of cervix, unspecified site ([place]) 180.9 C53.9      Current Dose: 39.6 Gy     Planned Dose:  45+ Gy  Narrative . . . . . . . . The patient presents for routine under treatment assessment. She is currently on her periaortic boost to bring her periaortic region up to dose given in the pelvis region.                                   The patient is without complaint. She does have some loose stools but no significant diarrhea. Denies any significant vaginal bleeding at this time. Her appetite is somewhat diminished. Weight is down 4 pounds over the past 7 days                                 Set-up films were reviewed.                                 The chart was checked. Physical Findings. . .  weight is 146 lb 11.2 oz (66.543 kg). Her oral temperature is 98 F (36.7 C). Her blood pressure is 152/84 and her pulse is 79. Her respiration is 20. . The lungs are clear. The heart has a regular rhythm and rate. A pelvic exam is performed. The cervical mass is decreased significantly in size. The cervical os is easily identified on bimanual and rectovaginal exam Impression . . . . . . . The patient is tolerating radiation. Plan . . . . . . . . . . . . Continue treatment as planned.  First brachytherapy procedure scheduled for January 3 along with Dr. Denman George. Patient will continue with additional boost after she completes her current field arrangement directed at the PET positive nodes and possibly boosting the sidewall areas.  ________________________________   Blair Promise, PhD, MD

## 2015-12-12 NOTE — Progress Notes (Signed)
wekly rad txs, pelvis/abd/paraaortic  22/25 completed, still has loose stools 3-4x day takes imodium prn,  Occasional dark brownish  Discharge on tissues when wiping from vagina, no bleeding, no nausea started her zofran when she wakes up in am, appetite fair to poor, fatigued 2:39 PM BP 152/84 mmHg  Pulse 79  Temp(Src) 98 F (36.7 C) (Oral)  Resp 20  Wt 146 lb 11.2 oz (66.543 kg)  Wt Readings from Last 3 Encounters:  12/12/15 146 lb 11.2 oz (66.543 kg)  12/11/15 147 lb 1.6 oz (66.724 kg)  12/05/15 150 lb 11.2 oz (68.357 kg)

## 2015-12-12 NOTE — Progress Notes (Signed)
[place] END OF TREATMENT   Name: YVETTE LOVELESS Date: December 12, 2015  MRN: [id number] DOB: 1956-09-25   TREATMENT DATES: 11-02-15 thru 12-04-15   REFERRING PHYSICIAN: P.Gehrig  DIAGNOSIS: squamous cell carcinoma of cervix  STAGE AT START OF TREATMENT: IIIB   INTENT: curative   DRUGS OR REGIMENS GIVEN: weekly sensitizing CDDP   MAJOR TOXICITIES: leukopenia, anemia, nausea   REASON TREATMENT STOPPED: cytopenias near completion of radiation   PERFORMANCE STATUS AT END: 1   ONGOING PROBLEMS: leukopenia, anemia, nausea   FOLLOW UP PLANS: complete IMRT, follow labs, restaging after all treatment completed

## 2015-12-13 ENCOUNTER — Ambulatory Visit
Admission: RE | Admit: 2015-12-13 | Discharge: 2015-12-13 | Disposition: A | Discharge status: Home / Self Care | Payer: BLUE CROSS/BLUE SHIELD | Source: Ambulatory Visit | Attending: Radiation Oncology | Admitting: Radiation Oncology

## 2015-12-13 DIAGNOSIS — Z510 Encounter for antineoplastic radiation therapy: Secondary | ICD-10-CM | POA: Diagnosis not present

## 2015-12-14 ENCOUNTER — Telehealth: Payer: Self-pay

## 2015-12-14 ENCOUNTER — Other Ambulatory Visit: Payer: Self-pay | Admitting: Oncology

## 2015-12-14 ENCOUNTER — Ambulatory Visit (HOSPITAL_BASED_OUTPATIENT_CLINIC_OR_DEPARTMENT_OTHER): Payer: BLUE CROSS/BLUE SHIELD

## 2015-12-14 ENCOUNTER — Ambulatory Visit
Admission: RE | Admit: 2015-12-14 | Discharge: 2015-12-14 | Disposition: A | Discharge status: Home / Self Care | Payer: BLUE CROSS/BLUE SHIELD | Source: Ambulatory Visit | Attending: Radiation Oncology | Admitting: Radiation Oncology

## 2015-12-14 DIAGNOSIS — Z510 Encounter for antineoplastic radiation therapy: Secondary | ICD-10-CM | POA: Diagnosis not present

## 2015-12-14 DIAGNOSIS — C539 Malignant neoplasm of cervix uteri, unspecified: Secondary | ICD-10-CM

## 2015-12-14 LAB — CBC WITH DIFFERENTIAL/PLATELET
BASO%: 0 % (ref 0.0–2.0)
BASOS ABS: 0 10*3/uL (ref 0.0–0.1)
EOS ABS: 0 10*3/uL (ref 0.0–0.5)
EOS%: 0.4 % (ref 0.0–7.0)
HCT: 30.4 % — ABNORMAL LOW (ref 34.8–46.6)
HGB: 10.3 g/dL — ABNORMAL LOW (ref 11.6–15.9)
LYMPH%: 19.2 % (ref 14.0–49.7)
MCH: 31.1 pg (ref 25.1–34.0)
MCHC: 33.9 g/dL (ref 31.5–36.0)
MCV: 91.8 fL (ref 79.5–101.0)
MONO#: 0.4 10*3/uL (ref 0.1–0.9)
MONO%: 17 % — ABNORMAL HIGH (ref 0.0–14.0)
NEUT#: 1.4 10*3/uL — ABNORMAL LOW (ref 1.5–6.5)
NEUT%: 63.4 % (ref 38.4–76.8)
PLATELETS: 100 10*3/uL — AB (ref 145–400)
RBC: 3.31 10*6/uL — AB (ref 3.70–5.45)
RDW: 20.8 % — ABNORMAL HIGH (ref 11.2–14.5)
WBC: 2.2 10*3/uL — ABNORMAL LOW (ref 3.9–10.3)
lymph#: 0.4 10*3/uL — ABNORMAL LOW (ref 0.9–3.3)

## 2015-12-14 NOTE — Telephone Encounter (Signed)
Told Valerie Figueroa that [name]. Valerie Figueroa said that her infection fighting wbc and hemoglobin is a little better today.   The platlets are a little lower today but still ok.   She needs to call the [name] if very fatigued , if she has a temp of 100.5 or greater or notices any bleeding prior to her visit with Dr. Marko Figueroa on 01-07-15. Valerie Figueroa verbalized understanding.

## 2015-12-15 ENCOUNTER — Ambulatory Visit: Payer: BLUE CROSS/BLUE SHIELD

## 2015-12-15 ENCOUNTER — Ambulatory Visit
Admission: RE | Admit: 2015-12-15 | Discharge: 2015-12-15 | Disposition: A | Discharge status: Home / Self Care | Payer: BLUE CROSS/BLUE SHIELD | Source: Ambulatory Visit | Attending: Radiation Oncology | Admitting: Radiation Oncology

## 2015-12-15 DIAGNOSIS — Z510 Encounter for antineoplastic radiation therapy: Secondary | ICD-10-CM | POA: Diagnosis not present

## 2015-12-18 ENCOUNTER — Ambulatory Visit: Payer: BLUE CROSS/BLUE SHIELD

## 2015-12-19 ENCOUNTER — Ambulatory Visit: Payer: BLUE CROSS/BLUE SHIELD

## 2015-12-19 ENCOUNTER — Encounter: Payer: Self-pay | Admitting: Radiation Oncology

## 2015-12-19 ENCOUNTER — Ambulatory Visit
Admission: RE | Admit: 2015-12-19 | Discharge: 2015-12-19 | Disposition: A | Discharge status: Home / Self Care | Payer: BLUE CROSS/BLUE SHIELD | Source: Ambulatory Visit | Attending: Radiation Oncology | Admitting: Radiation Oncology

## 2015-12-19 VITALS — BP 143/66 | HR 78 | Temp 98.1°F | Ht 64.0 in | Wt 145.9 lb

## 2015-12-19 DIAGNOSIS — C539 Malignant neoplasm of cervix uteri, unspecified: Secondary | ICD-10-CM

## 2015-12-19 DIAGNOSIS — Z510 Encounter for antineoplastic radiation therapy: Secondary | ICD-10-CM | POA: Diagnosis not present

## 2015-12-19 NOTE — Progress Notes (Signed)
Weekly Management Note Current Dose: 46.8 Gy Projected Dose: 50.4 Gy   Narrative:  The patient presents for routine under treatment assessment.  CBCT/MVCT images/Port film x-rays were reviewed.  The chart was checked.  Valerie Figueroa is here for her 25th fraction of radiation to her Pelvis. She denies pain, urinary symptoms, or bowel issues. She does admit to fatigue and sleeps during the day at times. She also does admit to some nausea during the day and she is using zofran once or twice a day. She had diarrhea, but that has resolved with imodium. She last received chemotherapy 2 weeks ago and also received a blood transfusion 1 week ago. She will see Dr. Marko Plume again on 01/08/16, but there is no chemotherapy scheduled at this time.   Physical Findings:  Unchanged. Alert and oriented x3.  Vitals: There were no vitals filed for this visit. Weight:  Wt Readings from Last 3 Encounters:  12/12/15 146 lb 11.2 oz (66.543 kg)  12/11/15 147 lb 1.6 oz (66.724 kg)  12/05/15 150 lb 11.2 oz (68.357 kg)   Lab Results  Component Value Date   WBC 2.2* 12/14/2015   HGB 10.3* 12/14/2015   HCT 30.4* 12/14/2015   MCV 91.8 12/14/2015   PLT 100* 12/14/2015   Lab Results  Component Value Date   CREATININE 0.7 12/11/2015   BUN 12.0 12/11/2015   NA 133* 12/11/2015   K 4.0 12/11/2015   CL 105 08/16/2015   CO2 21* 12/11/2015    Impression:  The patient is tolerating radiation.  Plan:  Continue treatment as planned with implant next week. Continue zofran and immodium as needed.   This document serves as a record of services personally performed by Thea Silversmith, MD. It was created on her behalf by Darcus Austin, a trained medical scribe. The creation of this record is based on the scribe's personal observations and the provider's statements to them. This document has been checked and approved by the attending provider.

## 2015-12-19 NOTE — Progress Notes (Signed)
Valerie Figueroa is here for her 25th fraction of radiation to her Pelvis. She denies pain. She also denies any urinary symptoms or bowel issues. She does admit to fatigue, and sleeps during the day at times. She also does admit to some nausea during the day, and she is using zofran once or twice a day. She last received chemotherapy 2 weeks ago and also received a blood transfusion 1 week ago. She will see Dr. Marko Plume again 01/08/16, but there is no chemotherapy scheduled at this time.   BP 143/66 mmHg  Pulse 78  Temp(Src) 98.1 F (36.7 C)  Ht '5\' 4"'$  (1.626 m)  Wt 145 lb 14.4 oz (66.18 kg)  BMI 25.03 kg/m2   Wt Readings from Last 3 Encounters:  12/19/15 145 lb 14.4 oz (66.18 kg)  12/12/15 146 lb 11.2 oz (66.543 kg)  12/11/15 147 lb 1.6 oz (66.724 kg)

## 2015-12-20 ENCOUNTER — Ambulatory Visit
Admission: RE | Admit: 2015-12-20 | Discharge: 2015-12-20 | Disposition: A | Discharge status: Home / Self Care | Payer: BLUE CROSS/BLUE SHIELD | Source: Ambulatory Visit | Attending: Radiation Oncology | Admitting: Radiation Oncology

## 2015-12-20 DIAGNOSIS — C538 Malignant neoplasm of overlapping sites of cervix uteri: Secondary | ICD-10-CM | POA: Insufficient documentation

## 2015-12-20 DIAGNOSIS — Z510 Encounter for antineoplastic radiation therapy: Secondary | ICD-10-CM | POA: Insufficient documentation

## 2015-12-20 NOTE — Patient Instructions (Signed)
Valerie Figueroa  12/20/2015   Your procedure is scheduled on: 12/26/2015   Report to [facility]  Entrance take [place]  elevators to 3rd floor to  [place] at   [place] AM.  Call this number if you have problems the morning of surgery [phone]   Remember: ONLY 1 PERSON MAY GO WITH YOU TO SHORT STAY TO GET  READY MORNING OF YOUR SURGERY.  Do not eat food or drink liquids :After Midnight.     Take these medicines the morning of surgery with A SIP OF WATER: Albuterol Inhaler if needed and bring, Ativan if needed                                 You may not have any metal on your body including hair pins and              piercings  Do not wear jewelry, make-up, lotions, powders or perfumes, deodorant             Do not wear nail polish.  Do not shave  48 hours prior to surgery.              Do not bring valuables to the hospital. [place].  Contacts, dentures or bridgework may not be worn into surgery.      Patients discharged the day of surgery will not be allowed to drive home.  Name and phone number of your driver:  Special Instructions: coughing and deep breathing exercises,leg exercises               Please read over the following fact sheets you were given: _____________________________________________________________________             [facility] - Preparing for Surgery Before surgery, you can play an important role.  Because skin is not sterile, your skin needs to be as free of germs as possible.  You can reduce the number of germs on your skin by washing with CHG (chlorahexidine gluconate) soap before surgery.  CHG is an antiseptic cleaner which kills germs and bonds with the skin to continue killing germs even after washing. Please DO NOT use if you have an allergy to CHG or antibacterial soaps.  If your skin becomes reddened/irritated stop using the CHG and inform your nurse  when you arrive at Short Stay. Do not shave (including legs and underarms) for at least 48 hours prior to the first CHG shower.  You may shave your face/neck. Please follow these instructions carefully:  1.  Shower with CHG Soap the night before surgery and the  morning of Surgery.  2.  If you choose to wash your hair, wash your hair first as usual with your  normal  shampoo.  3.  After you shampoo, rinse your hair and body thoroughly to remove the  shampoo.                           4.  Use CHG as you would any other liquid soap.  You can apply chg directly  to the skin and wash  Gently with a scrungie or clean washcloth.  5.  Apply the CHG Soap to your body ONLY FROM THE NECK DOWN.   Do not use on face/ open                           Wound or open sores. Avoid contact with eyes, ears mouth and genitals (private parts).                       Wash face,  Genitals (private parts) with your normal soap.             6.  Wash thoroughly, paying special attention to the area where your surgery  will be performed.  7.  Thoroughly rinse your body with warm water from the neck down.  8.  DO NOT shower/wash with your normal soap after using and rinsing off  the CHG Soap.                9.  Pat yourself dry with a clean towel.            10.  Wear clean pajamas.            11.  Place clean sheets on your bed the night of your first shower and do not  sleep with pets. Day of Surgery : Do not apply any lotions/deodorants the morning of surgery.  Please wear clean clothes to the hospital/surgery center.  FAILURE TO FOLLOW THESE INSTRUCTIONS MAY RESULT IN THE CANCELLATION OF YOUR SURGERY PATIENT SIGNATURE_________________________________  NURSE SIGNATURE__________________________________  ________________________________________________________________________

## 2015-12-21 ENCOUNTER — Encounter: Payer: Self-pay | Admitting: Radiation Oncology

## 2015-12-21 ENCOUNTER — Ambulatory Visit
Admission: RE | Admit: 2015-12-21 | Discharge: 2015-12-21 | Disposition: A | Discharge status: Home / Self Care | Payer: BLUE CROSS/BLUE SHIELD | Source: Ambulatory Visit | Attending: Radiation Oncology | Admitting: Radiation Oncology

## 2015-12-21 ENCOUNTER — Encounter (HOSPITAL_COMMUNITY)
Admission: RE | Admit: 2015-12-21 | Discharge: 2015-12-21 | Disposition: A | Discharge status: Home / Self Care | Payer: BLUE CROSS/BLUE SHIELD | Source: Ambulatory Visit | Attending: Radiation Oncology | Admitting: Radiation Oncology

## 2015-12-21 ENCOUNTER — Encounter (HOSPITAL_COMMUNITY): Payer: Self-pay

## 2015-12-21 VITALS — BP 127/76 | HR 85 | Temp 98.3°F | Ht 64.0 in | Wt 144.6 lb

## 2015-12-21 DIAGNOSIS — Z510 Encounter for antineoplastic radiation therapy: Secondary | ICD-10-CM | POA: Diagnosis not present

## 2015-12-21 DIAGNOSIS — C531 Malignant neoplasm of exocervix: Secondary | ICD-10-CM

## 2015-12-21 HISTORY — DX: Cardiac murmur, unspecified: R01.1

## 2015-12-21 LAB — BASIC METABOLIC PANEL
ANION GAP: 10 (ref 5–15)
BUN: 15 mg/dL (ref 6–20)
CALCIUM: 9.5 mg/dL (ref 8.9–10.3)
CO2: 24 mmol/L (ref 22–32)
Chloride: 101 mmol/L (ref 101–111)
Creatinine, Ser: 0.67 mg/dL (ref 0.44–1.00)
GFR calc Af Amer: >60 mL/min (ref >60)
GFR calc non Af Amer: >60 mL/min (ref >60)
GLUCOSE: 106 mg/dL — AB (ref 65–99)
POTASSIUM: 4.5 mmol/L (ref 3.5–5.1)
Sodium: 135 mmol/L (ref 135–145)

## 2015-12-21 LAB — CBC
HEMATOCRIT: 31.3 % — AB (ref 36.0–46.0)
HEMOGLOBIN: 10.4 g/dL — AB (ref 12.0–15.0)
MCH: 31 pg (ref 26.0–34.0)
MCHC: 33.2 g/dL (ref 30.0–36.0)
MCV: 93.4 fL (ref 78.0–100.0)
Platelets: 97 10*3/uL — ABNORMAL LOW (ref 150–400)
RBC: 3.35 MIL/uL — ABNORMAL LOW (ref 3.87–5.11)
RDW: 21.3 % — ABNORMAL HIGH (ref 11.5–15.5)
WBC: 3.7 10*3/uL — ABNORMAL LOW (ref 4.0–10.5)

## 2015-12-21 NOTE — Progress Notes (Signed)
Ms. Valerie Figueroa is here for her last treatment of radiation to her Pelvis. She denies any problems with her bowels, or bladder. She is having normal bowel movements and urinating without pain or blood. She admits to occasional fatigue, one day she will feel well, and the next she is "dragging".  The skin to her pelvis is normal appearing without any tenderness. She does admit to "a weak stomach" 5 or 6 hours after her radiation treatment, but the feeling will go away by evening time. She has no other concerns at this time.   BP 127/76 mmHg  Pulse 85  Temp(Src) 98.3 F (36.8 C)  Ht '5\' 4"'$  (1.626 m)  Wt 144 lb 9.6 oz (65.59 kg)  BMI 24.81 kg/m2   Wt Readings from Last 3 Encounters:  12/21/15 144 lb 9.6 oz (65.59 kg)  12/19/15 145 lb 14.4 oz (66.18 kg)  12/12/15 146 lb 11.2 oz (66.543 kg)

## 2015-12-21 NOTE — Progress Notes (Signed)
CT chest- 10/24/2015- EPIC

## 2015-12-21 NOTE — Progress Notes (Signed)
CBC results of 12/21/2015 faxed via EPIC to Dr Sondra Come.

## 2015-12-21 NOTE — Progress Notes (Signed)
  Radiation Oncology         [phone]) [phone] ________________________________  Name: Valerie Figueroa MRN: [id number]  Date: 12/21/2015  DOB: May 23, 1956  Weekly Radiation Therapy Management    ICD-9-CM ICD-10-CM   1. Malignant neoplasm of exocervix (HCC) 180.1 C53.1     Current Dose: 50.4 Gy     Planned Dose:  50.4 Gy  Narrative . . . . . . . . The patient presents for the final under treatment assessment.                                            The patient has had some continuation of previously noted symptoms.           Valerie Figueroa is here for her last treatment of radiation to her Pelvis. She denies any problems with her bowels, or bladder. She is having normal bowel movements and urinating without pain or blood. She admits to occasional fatigue, one day she will feel well, and the next she is "dragging". The skin to her pelvis is normal appearing without any tenderness. She does admit to "a weak stomach" 5 or 6 hours after her radiation treatment, but the feeling will go away by evening time. She has no other concerns at this time.                                  Set-up films were reviewed.                                 The chart was checked. Physical Findings. . . Weight essentially stable.  No significant changes. Impression . . . . . . . The patient tolerated radiation relatively well. Plan . . . . . . . . . . . . Complete radiation today as scheduled, and follow-up in one month. The patient was encouraged to call or return to the clinic in the interim for any worsening symptoms.  ________________________________  Sheral Apley Tammi Klippel, M.D.  This document serves as a record of services personally performed by Tyler Pita, MD. It was created on his behalf by Arlyce Harman, a trained medical scribe. The creation of this record is based on the scribe's personal observations and the provider's statements to them. This document has been checked and approved by the  attending provider.

## 2015-12-22 ENCOUNTER — Ambulatory Visit: Payer: BLUE CROSS/BLUE SHIELD

## 2015-12-25 ENCOUNTER — Other Ambulatory Visit: Payer: Self-pay | Admitting: Oncology

## 2015-12-25 NOTE — H&P (Signed)
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 female who is   currently undergoing definitive radiation and radiosensitizing chemotherapy. She now ready to undergo her first brachytherapy procedure. She is tolerating her treatment well.     PAST MEDICAL HISTORY:  has a past medical history of Anemia; Cervical cancer ([place]); Radiation (08/31/15-09/04/15); and Heart murmur.    PAST SURGICAL HISTORY: Past Surgical History  Procedure Laterality Date  . Ovarian cyst removal Left 01-17-1985  . Carpal tunnel release Bilateral   . Tubal ligation      FAMILY HISTORY: family history includes Cancer in her father.  SOCIAL HISTORY:  reports that she has been smoking Cigarettes.  She has a 15 pack-year smoking history. She has never used smokeless tobacco. She reports that she does not drink alcohol or use illicit drugs.  ALLERGIES: Paclitaxel  MEDICATIONS:  No current facility-administered medications for this encounter.   Current Outpatient Prescriptions  Medication Sig Dispense Refill  . acetaminophen (TYLENOL) 500 MG tablet Take 1,000 mg by mouth every 8 (eight) hours as needed for mild pain.     Marland Kitchen albuterol (PROVENTIL HFA;VENTOLIN HFA) 108 (90 BASE) MCG/ACT inhaler Inhale 2 puffs into the lungs every 6 (six) hours as needed for wheezing or shortness of breath. 1 Inhaler 2  . Fe Fum-FA-B Cmp-C-Zn-Mg-Mn-Cu (CENTRATEX) 106-1 MG CAPS Take 1 capsule by mouth daily.    Marland Kitchen loperamide (IMODIUM) 2 MG capsule Take 2 mg by mouth daily as needed for diarrhea or loose stools.     Marland Kitchen LORazepam (ATIVAN) 0.5 MG tablet Place 1 tablet under the tongue or swallow every 6 hours as needed for nausea. Will make drowsy. 20 tablet 0  .  Multiple Vitamins-Minerals (CENTRUM ULTRA WOMENS) TABS Take 1 tablet by mouth every morning.    . ondansetron (ZOFRAN) 8 MG tablet Take 1 tablet (8 mg total) by mouth every 8 (eight) hours as needed for nausea or vomiting. 30 tablet 1  . polyethylene glycol (MIRALAX / GLYCOLAX) packet Take 17 g by mouth daily as needed (constipation). Reported on 12/19/2015    . traMADol (ULTRAM) 50 MG tablet Take 1 tablet (50 mg total) by mouth every 8 (eight) hours as needed. (Patient not taking: Reported on 12/19/2015) 20 tablet 0  . loratadine (CLARITIN) 10 MG tablet Take 10 mg by mouth daily. [employer]      REVIEW OF SYSTEMS:  A 15 point review of systems is documented in the electronic medical record. This was obtained by the nursing staff. However, I reviewed this with the patient to discuss relevant findings and make appropriate changes.  Some fatigue, no pelvic pain or vaginal bleeding   PHYSICAL EXAM: BP 127/76 mmHg  Pulse 85  Temp(Src) 98.3 F (36.8 C)  Ht '5\' 4"'$  (1.626 m)  Wt 144 lb 9.6 oz (65.59 kg)  BMI 24.81 kg/m2   Well-nourished, well-developed female in no acute distress. Neck: Supple, no lymphadenopathy no thyromegaly. No palpable cervical or supraclavicular adenopathy. Mouth: Partial on top; few teeth missing on bottom. Lungs: Clear to auscultation. Cardiac: Regular rate and rhythm. Abdomen: . Abdomen is soft, nontender, nondistended. There are no palpable masses or hepatosplenomegaly. Groins: No lymphadenopathy. Extremities:  No peripheral edema. Pulses are good. Skin: Birth mark from right flank to right lower back area. 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     ------------------------------------------------  Blair Promise, PhD, MD

## 2015-12-26 ENCOUNTER — Ambulatory Visit (HOSPITAL_COMMUNITY): Payer: BLUE CROSS/BLUE SHIELD | Admitting: Anesthesiology

## 2015-12-26 ENCOUNTER — Ambulatory Visit (HOSPITAL_COMMUNITY)
Admission: RE | Admit: 2015-12-26 | Discharge: 2015-12-26 | Disposition: A | Discharge status: Home / Self Care | Payer: BLUE CROSS/BLUE SHIELD | Source: Ambulatory Visit | Attending: Radiation Oncology | Admitting: Radiation Oncology

## 2015-12-26 ENCOUNTER — Ambulatory Visit
Admission: RE | Admit: 2015-12-26 | Discharge: 2015-12-26 | Disposition: A | Discharge status: Home / Self Care | Payer: BLUE CROSS/BLUE SHIELD | Source: Ambulatory Visit | Attending: Radiation Oncology | Admitting: Radiation Oncology

## 2015-12-26 ENCOUNTER — Encounter (HOSPITAL_COMMUNITY)
Admission: RE | Disposition: A | Discharge status: Home / Self Care | Payer: Self-pay | Source: Ambulatory Visit | Attending: Radiation Oncology

## 2015-12-26 ENCOUNTER — Encounter (HOSPITAL_COMMUNITY): Payer: Self-pay | Admitting: *Deleted

## 2015-12-26 VITALS — BP 134/54 | HR 85 | Temp 98.5°F

## 2015-12-26 DIAGNOSIS — C539 Malignant neoplasm of cervix uteri, unspecified: Secondary | ICD-10-CM

## 2015-12-26 DIAGNOSIS — Z510 Encounter for antineoplastic radiation therapy: Secondary | ICD-10-CM | POA: Diagnosis not present

## 2015-12-26 DIAGNOSIS — F17210 Nicotine dependence, cigarettes, uncomplicated: Secondary | ICD-10-CM | POA: Diagnosis not present

## 2015-12-26 DIAGNOSIS — Z79899 Other long term (current) drug therapy: Secondary | ICD-10-CM | POA: Insufficient documentation

## 2015-12-26 DIAGNOSIS — D069 Carcinoma in situ of cervix, unspecified: Secondary | ICD-10-CM | POA: Insufficient documentation

## 2015-12-26 DIAGNOSIS — Z888 Allergy status to other drugs, medicaments and biological substances status: Secondary | ICD-10-CM | POA: Insufficient documentation

## 2015-12-26 DIAGNOSIS — Z9851 Tubal ligation status: Secondary | ICD-10-CM | POA: Diagnosis not present

## 2015-12-26 HISTORY — PX: TANDEM RING INSERTION: SHX6199

## 2015-12-26 SURGERY — INSERTION, UTERINE TANDEM AND RING OR CYLINDER, FOR BRACHYTHERAPY
Anesthesia: General

## 2015-12-26 MED ORDER — ONDANSETRON HCL 4 MG/2ML IJ SOLN
INTRAMUSCULAR | Status: AC
  Filled 2015-12-26: qty 2

## 2015-12-26 MED ORDER — MIDAZOLAM HCL 5 MG/5ML IJ SOLN
INTRAMUSCULAR | Status: DC | PRN
  Administered 2015-12-26: 2 mg via INTRAVENOUS

## 2015-12-26 MED ORDER — PHENYLEPHRINE HCL 10 MG/ML IJ SOLN
INTRAMUSCULAR | Status: DC | PRN
  Administered 2015-12-26 (×7): 80 ug via INTRAVENOUS

## 2015-12-26 MED ORDER — FENTANYL CITRATE (PF) 250 MCG/5ML IJ SOLN
INTRAMUSCULAR | Status: AC
  Filled 2015-12-26: qty 5

## 2015-12-26 MED ORDER — KETOROLAC TROMETHAMINE 30 MG/ML IJ SOLN
INTRAMUSCULAR | Status: DC | PRN
  Administered 2015-12-26: 30 mg via INTRAVENOUS

## 2015-12-26 MED ORDER — ONDANSETRON HCL 4 MG/2ML IJ SOLN
INTRAMUSCULAR | Status: DC | PRN
  Administered 2015-12-26: 4 mg via INTRAVENOUS

## 2015-12-26 MED ORDER — LIDOCAINE HCL (CARDIAC) 20 MG/ML IV SOLN
INTRAVENOUS | Status: AC
  Filled 2015-12-26: qty 5

## 2015-12-26 MED ORDER — ESTRADIOL 0.1 MG/GM VA CREA
TOPICAL_CREAM | VAGINAL | Status: DC | PRN
  Administered 2015-12-26: 1 via VAGINAL

## 2015-12-26 MED ORDER — FENTANYL CITRATE (PF) 100 MCG/2ML IJ SOLN
INTRAMUSCULAR | Status: AC
  Filled 2015-12-26: qty 2

## 2015-12-26 MED ORDER — OXYCODONE HCL 5 MG/5ML PO SOLN
5.0000 mg | Freq: Once | ORAL | Status: DC | PRN
  Filled 2015-12-26: qty 5

## 2015-12-26 MED ORDER — MIDAZOLAM HCL 2 MG/2ML IJ SOLN
INTRAMUSCULAR | Status: AC
  Filled 2015-12-26: qty 2

## 2015-12-26 MED ORDER — PROPOFOL 10 MG/ML IV BOLUS
INTRAVENOUS | Status: DC | PRN
  Administered 2015-12-26: 150 mg via INTRAVENOUS

## 2015-12-26 MED ORDER — FENTANYL CITRATE (PF) 100 MCG/2ML IJ SOLN
25.0000 ug | INTRAMUSCULAR | Status: DC | PRN
  Administered 2015-12-26 (×2): 50 ug via INTRAVENOUS

## 2015-12-26 MED ORDER — KETOROLAC TROMETHAMINE 30 MG/ML IJ SOLN
INTRAMUSCULAR | Status: AC
  Filled 2015-12-26: qty 1

## 2015-12-26 MED ORDER — LACTATED RINGERS IV SOLN
INTRAVENOUS | Status: DC | PRN
  Administered 2015-12-26 (×2): via INTRAVENOUS

## 2015-12-26 MED ORDER — PROPOFOL 10 MG/ML IV BOLUS
INTRAVENOUS | Status: AC
  Filled 2015-12-26: qty 20

## 2015-12-26 MED ORDER — PHENYLEPHRINE 40 MCG/ML (10ML) SYRINGE FOR IV PUSH (FOR BLOOD PRESSURE SUPPORT)
PREFILLED_SYRINGE | INTRAVENOUS | Status: AC
  Filled 2015-12-26: qty 10

## 2015-12-26 MED ORDER — WATER FOR IRRIGATION, STERILE IR SOLN
Status: DC | PRN
  Administered 2015-12-26: 3000 mL

## 2015-12-26 MED ORDER — HYDROMORPHONE HCL 1 MG/ML IJ SOLN
0.5000 mg | Freq: Once | INTRAMUSCULAR | Status: AC
  Administered 2015-12-26: 0.5 mg via INTRAVENOUS
  Filled 2015-12-26: qty 1

## 2015-12-26 MED ORDER — GLYCOPYRROLATE 0.2 MG/ML IJ SOLN
INTRAMUSCULAR | Status: AC
Start: 2015-12-26 — End: 2015-12-26
  Filled 2015-12-26: qty 1

## 2015-12-26 MED ORDER — LIDOCAINE HCL (CARDIAC) 20 MG/ML IV SOLN
INTRAVENOUS | Status: DC | PRN
  Administered 2015-12-26: 50 mg via INTRAVENOUS

## 2015-12-26 MED ORDER — ROCURONIUM BROMIDE 100 MG/10ML IV SOLN
INTRAVENOUS | Status: AC
  Filled 2015-12-26: qty 1

## 2015-12-26 MED ORDER — DEXAMETHASONE SODIUM PHOSPHATE 10 MG/ML IJ SOLN
INTRAMUSCULAR | Status: DC | PRN
  Administered 2015-12-26: 10 mg via INTRAVENOUS

## 2015-12-26 MED ORDER — PROPOFOL 10 MG/ML IV BOLUS
INTRAVENOUS | Status: AC
  Filled 2015-12-26: qty 40

## 2015-12-26 MED ORDER — SODIUM CHLORIDE 0.9 % IJ SOLN
INTRAMUSCULAR | Status: AC
  Filled 2015-12-26: qty 10

## 2015-12-26 MED ORDER — ESTRADIOL 0.1 MG/GM VA CREA
TOPICAL_CREAM | VAGINAL | Status: AC
  Filled 2015-12-26: qty 42.5

## 2015-12-26 MED ORDER — DEXAMETHASONE SODIUM PHOSPHATE 10 MG/ML IJ SOLN
INTRAMUSCULAR | Status: AC
  Filled 2015-12-26: qty 1

## 2015-12-26 MED ORDER — ACETAMINOPHEN 325 MG PO TABS: 325.0000 mg | ORAL_TABLET | ORAL | Status: DC | PRN

## 2015-12-26 MED ORDER — ACETAMINOPHEN 160 MG/5ML PO SOLN: 325.0000 mg | ORAL | Status: DC | PRN

## 2015-12-26 MED ORDER — FENTANYL CITRATE (PF) 100 MCG/2ML IJ SOLN
INTRAMUSCULAR | Status: DC | PRN
  Administered 2015-12-26 (×2): 50 ug via INTRAVENOUS
  Administered 2015-12-26: 100 ug via INTRAVENOUS

## 2015-12-26 MED ORDER — OXYCODONE HCL 5 MG PO TABS: 5.0000 mg | ORAL_TABLET | Freq: Once | ORAL | Status: DC | PRN

## 2015-12-26 MED ORDER — EPHEDRINE SULFATE 50 MG/ML IJ SOLN
INTRAMUSCULAR | Status: AC
  Filled 2015-12-26: qty 1

## 2015-12-26 SURGICAL SUPPLY — 31 items
BAG URINE DRAINAGE (UROLOGICAL SUPPLIES) ×3 IMPLANT
CATH FOLEY 2WAY SLVR  5CC 16FR (CATHETERS) ×2
CATH FOLEY 2WAY SLVR 5CC 16FR (CATHETERS) ×1 IMPLANT
COVER SURGICAL LIGHT HANDLE (MISCELLANEOUS) ×3 IMPLANT
DRAPE LG THREE QUARTER DISP (DRAPES) ×2 IMPLANT
DRAPE SHEET LG 3/4 BI-LAMINATE (DRAPES) ×3 IMPLANT
DRAPE TABLE BACK 44X90 PK DISP (DRAPES) ×3 IMPLANT
DRSG PAD ABDOMINAL 8X10 ST (GAUZE/BANDAGES/DRESSINGS) IMPLANT
GAUZE SPONGE 4X4 16PLY XRAY LF (GAUZE/BANDAGES/DRESSINGS) ×3 IMPLANT
GLOVE BIO SURGEON STRL SZ 6 (GLOVE) ×6 IMPLANT
GLOVE BIO SURGEON STRL SZ7.5 (GLOVE) ×6 IMPLANT
GOWN STRL REUS W/TWL LRG LVL3 (GOWN DISPOSABLE) ×6 IMPLANT
HOLDER FOLEY CATH W/STRAP (MISCELLANEOUS) ×3 IMPLANT
KIT BASIN OR (CUSTOM PROCEDURE TRAY) ×3 IMPLANT
LEGGING LITHOTOMY PAIR STRL (DRAPES) ×3 IMPLANT
LUBRICANT JELLY K Y 4OZ (MISCELLANEOUS) ×3 IMPLANT
PACK LITHOTOMY IV (CUSTOM PROCEDURE TRAY) ×3 IMPLANT
PACKING VAGINAL (PACKING) ×3 IMPLANT
PAD ABD 8X10 STRL (GAUZE/BANDAGES/DRESSINGS) ×3 IMPLANT
PLUG CATH AND CAP STER (CATHETERS) IMPLANT
SET CYSTO W/LG BORE CLAMP LF (SET/KITS/TRAYS/PACK) ×3 IMPLANT
SET IRRIG Y TYPE TUR BLADDER L (SET/KITS/TRAYS/PACK) IMPLANT
SUT PROLENE 2 0 CT2 30 (SUTURE) ×6 IMPLANT
SUT VIC AB 0 CT1 27 (SUTURE) ×6
SUT VIC AB 0 CT1 27XBRD ANTBC (SUTURE) ×3 IMPLANT
SYRINGE 10CC LL (SYRINGE) ×3 IMPLANT
TOWEL OR 17X26 10 PK STRL BLUE (TOWEL DISPOSABLE) ×6 IMPLANT
UNDERPAD 30X30 INCONTINENT (UNDERPADS AND DIAPERS) ×6 IMPLANT
WATER STERILE IRR 3000ML UROMA (IV SOLUTION) IMPLANT
WATER STERILE IRR 500ML POUR (IV SOLUTION) IMPLANT
YANKAUER SUCT BULB TIP 10FT TU (MISCELLANEOUS) ×3 IMPLANT

## 2015-12-26 NOTE — Brief Op Note (Signed)
12/26/2015  8:43 AM  PATIENT:  Valerie Figueroa  60 y.o. female  PRE-OPERATIVE DIAGNOSIS:  cancer cervical  POST-OPERATIVE DIAGNOSIS:  cancer cervical  PROCEDURE:  Procedure(s): TANDEM RING INSERTION (N/A)  SURGEON:  Surgeon(s) and Role:    * Gery Pray, MD - Primary    * Everitt Amber, MD - Assisting  PHYSICIAN ASSISTANT:   ASSISTANTS: none   ANESTHESIA:   general  EBL:  Total I/O In: 1000 [I.V.:1000] Out: - 250  BLOOD ADMINISTERED:none  DRAINS: Urinary Catheter (Foley)   LOCAL MEDICATIONS USED:  NONE  SPECIMEN:  No Specimen  DISPOSITION OF SPECIMEN:  N/A  COUNTS:  YES  TOURNIQUET:  * No tourniquets in log *  DICTATION: Patient was taken to the operating room. Timeout was performed. The patient was prepped and draped in the usual sterile fashion and placed in the dorsolithotomy position. Exam under anesthesia revealed shrinkage of the cervical mass. The patient had minimal ectocervical component but continued to have a prominent endocervical component. Intraoperative ultrasound was established. Approximately 200 mL of sterile water was placed in the bladder for imaging purposes. The patient proceeded to undergo sounding of the uterus and dilation of the cervix by Dr. Denman George. The uterus sounded to approximately 7 cm. the uterus was mildly anteverted.  Patient then had a 60 mm cervical sleeve placed within the uterine canal. This was sewn to the cervical region by Dr. Denman George using two 2.0 Prolene sutures. The patient then had a 60 mm, 60 tandem placed within the uterine canal and within the cervical sleeve. A 60 ring with minimal shielding was placed along the cervical area. A rectal paddle was also placed. Sterile gauze soaked in Estrace cream was then placed in the vaginal vault. The patient tolerated the procedure well  PLAN OF CARE: Transferred to radiation oncology for planning and treatment  PATIENT DISPOSITION:  PACU - hemodynamically stable.   Delay start of  Pharmacological VTE agent (>24hrs) due to surgical blood loss or risk of bleeding: not applicable

## 2015-12-26 NOTE — Transfer of Care (Signed)
Immediate Anesthesia Transfer of Care Note  Patient: Valerie Figueroa  Procedure(s) Performed: Procedure(s): TANDEM RING INSERTION (N/A)  Patient Location: PACU  Anesthesia Type:General  Level of Consciousness:  sedated, patient cooperative and responds to stimulation  Airway & Oxygen Therapy:Patient Spontanous Breathing and Patient connected to face mask oxgen  Post-op Assessment:  Report given to PACU RN and Post -op Vital signs reviewed and stable  Post vital signs:  Reviewed and stable  Last Vitals:  Filed Vitals:   12/26/15 0528  BP: 148/71  Pulse: 76  Temp: 36.6 C  Resp: 18    Complications: No apparent anesthesia complications

## 2015-12-26 NOTE — Progress Notes (Signed)
Patient reports pain at an 8/10 in her vaginal area.  She has been given 0.5 mg dilaudid IV per Dr. Sondra Come.  Will continue to monitor.

## 2015-12-26 NOTE — Progress Notes (Signed)
Received report from [place], [place] in PACU.  Transported patient to nursing via stretcher with Nicki Reaper, Nurse Transporter.  Patient has foley catheter draining yellow urine.  She has a #20 IV in her left hand with LR infusing at 125 ml/hr.  She denies having any pain.

## 2015-12-26 NOTE — Progress Notes (Signed)
IMMEDIATELY FOLLOWING SURGERY: Do not drive or operate machinery for the first twenty four hours after surgery. Do not make any important decisions for twenty four hours after surgery or while taking narcotic pain medications or sedatives. If you develop intractable nausea and vomiting or a severe headache please notify your doctor immediately.   FOLLOW-UP: You do not need to follow up with anesthesia unless specifically instructed to do so.   WOUND CARE INSTRUCTIONS (if applicable): Expect some mild vaginal bleeding, but if large amount of bleeding occurs please contact Dr. Sondra Come at [phone] or the Radiation On-Call physician. Call for any fever greater than 101.0 degrees or increasing vaginal//abdominal pain or trouble urinating.   QUESTIONS?: Please feel free to call your physician or the hospital operator if you have any questions, and they will be happy to assist you. Resume all medications: as listed on your after visit summary. Your next appointment is:  Future Appointments Date Time Provider [place]  01/04/2016 10:00 AM Gery Pray, MD [facility] None  01/04/2016 2:00 PM Gery Pray, MD [facility] None  01/08/2016 8:15 AM [facility] None  01/08/2016 9:00 AM Lennis Marion Downer, MD [facility] None  01/11/2016 10:00 AM Gery Pray, MD [facility] None  01/11/2016 2:00 PM Gery Pray, MD [facility] None  01/17/2016 11:00 AM Gery Pray, MD [facility] None  01/17/2016 3:00 PM Gery Pray, MD [facility] None  01/25/2016 10:00 AM Gery Pray, MD [facility] None  01/25/2016 2:00 PM Gery Pray, MD [facility] None  02/28/2016 10:45 AM Nancy Marus, MD [facility] None

## 2015-12-26 NOTE — Progress Notes (Addendum)
Foley catheter and IV removed by Gaspar Garbe, RN.  Valerie Figueroa discharge instructions and paperwork. She ambulated out of the clinic with her daughter in law.

## 2015-12-26 NOTE — Interval H&P Note (Signed)
History and Physical Interval Note:  12/26/2015 7:10 AM  Valerie Figueroa  has presented today for surgery, with the diagnosis of cancer cervical  The various methods of treatment have been discussed with the patient and family. After consideration of risks, benefits and other options for treatment, the patient has consented to  Procedure(s) with comments: TANDEM RING INSERTION (N/A) - Denman George helping as a surgical intervention .  The patient's history has been reviewed, patient examined, no change in status, stable for surgery.  I have reviewed the patient's chart and labs.  Questions were answered to the patient's satisfaction.     Gery Pray

## 2015-12-26 NOTE — Progress Notes (Signed)
  Radiation Oncology         (336) [phone] ________________________________  Name: Valerie Figueroa MRN: [id number]  Date: 12/26/2015  DOB: 1956-02-03  CC: [name], MD  Nancy Marus, MD  HDR BRACHYTHERAPY NOTE  DIAGNOSIS: Stage IIIB cervical cancer  NARRATIVE: The patient was brought to the [place] suite. Identity was confirmed. All relevant records and images related to the planned course of therapy were reviewed. The patient freely provided informed written consent to proceed with treatment after reviewing the details related to the planned course of therapy. The consent form was witnessed and verified by the simulation staff. Then, the patient was set-up in a stable reproducible supine position for radiation therapy. The tandem ring system was accessed and fiducial markers were placed within the tandem and ring.   Verification simulation note: An AP and lateral film was obtained through the pelvis area. This was compared to the patient's planning films documenting accurate position of the tandem/ring system for treatment.  High-dose-rate brachytherapy treatment note:  The remote afterloading device was accessed through catheter system and attached to the tandem ring system. Patient then proceeded to undergo her 1st high-dose-rate treatment directed at the cervix. The patient was prescribed a dose of 5.5 gray to be delivered to the [place]. Marland Kitchen Patient was treated with 2 channels using multiple dwell positions. Treatment time was 784 seconds. The patient tolerated the procedure well. After completion of her therapy, a radiation survey was performed documenting return of the iridium source into the [employer] safe. The patient was then transferred to the nursing suite. Her vaginal packing was removed. She then had removal of the rectal paddle followed by the tandem and ring system.  the patient tolerated the removal well.  PLAN: Patient will return next week for her second high-dose-rate  treatment ________________________________  Blair Promise, PhD, MD

## 2015-12-26 NOTE — Anesthesia Preprocedure Evaluation (Signed)
Anesthesia Evaluation  Patient identified by MRN, date of birth, ID band Patient awake    Reviewed: Allergy & Precautions, NPO status , Patient's Chart, lab work & pertinent test results  History of Anesthesia Complications Negative for: history of anesthetic complications  Airway Mallampati: III  TM Distance: >3 FB Neck ROM: Full    Dental  (+) Partial Upper   Pulmonary neg shortness of breath, neg sleep apnea, neg COPD, neg recent URI, Current Smoker,    breath sounds clear to auscultation       Cardiovascular negative cardio ROS   Rhythm:Regular     Neuro/Psych negative neurological ROS  negative psych ROS   GI/Hepatic negative GI ROS, Neg liver ROS,   Endo/Other  negative endocrine ROS  Renal/GU negative Renal ROS     Musculoskeletal   Abdominal   Peds  Hematology  (+) anemia ,   Anesthesia Other Findings   Reproductive/Obstetrics                             Anesthesia Physical Anesthesia Plan  ASA: II  Anesthesia Plan: General   Post-op Pain Management:    Induction: Intravenous  Airway Management Planned: LMA  Additional Equipment: None  Intra-op Plan:   Post-operative Plan: Extubation in OR  Informed Consent: I have reviewed the patients History and Physical, chart, labs and discussed the procedure including the risks, benefits and alternatives for the proposed anesthesia with the patient or authorized representative who has indicated his/her understanding and acceptance.   Dental advisory given  Plan Discussed with: CRNA and Surgeon  Anesthesia Plan Comments:         Anesthesia Quick Evaluation

## 2015-12-26 NOTE — Op Note (Signed)
OPERATIVE NOTE  12/26/15  PATIENT: Valerie Figueroa DATE: 12/26/15   Preop Diagnosis: cervical cancer stage IIIB  Postoperative Diagnosis: same  Surgery: ultrasound guided dilation of cervix and placement of smitt sleeve.  Surgeons:  Donaciano Eva, MD Assistant: none  Anesthesia: General   Estimated blood loss: 34m  IVF:  1045m  Urine output: (bladder retrograde)   Complications: None   Pathology: none   Operative findings: no visible residual ectocervical tumor, cervix flush with vagina, uterus sounded to 7cm, palpable lower uterine segment/endocervical mass remaining (3cm).  Procedure: The patient was identified in the preoperative holding area. Informed consent was signed on the chart. Patient was seen history was reviewed and exam was performed.   The patient was then taken to the operating room and placed in the supine position with SCD hose on. General anesthesia was then induced without difficulty. She was then placed in the dorsolithotomy position. The perineum was prepped with Betadine. The vagina was prepped with Betadine. The patient was then draped after the prep was dried. An in and out catheterization to empty the bladder was performed under sterile conditions with a sterile catheter which was then retrograde filled with 200cc.   Timeout was performed the patient, procedure, antibiotic, allergy, and length of procedure.   The weighted speculum was placed in the posterior vagina. The right angle retractor was placed anteriorally to visualize the cervix.  The anterior lip of the cervix was grasped with a tenaculum and the cervix was probed with a sound. The sound was placed under ultrasound guidance to the fundus. The pratts dilators were used to dilate the cervix under constant ultrasound guidance. No perforation was noted. 2-0 prolene sutures were placed at the anterior and posterior lip of the cervix and passed through the smitt sleeve which  was then advanced into the endocervical canal and uterus under USKoreauidance. The sutures were tied down.  All instrument, suture, laparotomy, Ray-Tec, and needle counts were correct x2. The patient tolerated the procedure well and was taken recovery room in stable condition. This is Valerie Figueroa Valerie Figueroa an operative note on Valerie Figueroa EvaMD

## 2015-12-26 NOTE — Progress Notes (Signed)
  Radiation Oncology         [phone]) [phone] ________________________________  Name: Valerie Figueroa MRN: [id number]  Date: 12/26/2015  DOB: 1956/03/16  SIMULATION AND TREATMENT PLANNING NOTE HDR BRACHYTHERAPY  DIAGNOSIS:  Stage IIIB cervical cancer  NARRATIVE:  The patient was brought to the [facility].  Identity was confirmed.  All relevant records and images related to the planned course of therapy were reviewed.  The patient freely provided informed written consent to proceed with treatment after reviewing the details related to the planned course of therapy. The consent form was witnessed and verified by the simulation staff.  Then, the patient was set-up in a stable reproducible  supine position for radiation therapy.  CT images were obtained.  Surface markings were placed.  The CT images were loaded into the planning software.  Then the target and avoidance structures were contoured.  Treatment planning then occurred.  The radiation prescription was entered and confirmed.   I have requested : Brachytherapy Isodose Plan and Dosimetry Calculations to plan the radiation distribution.    PLAN:  The patient will receive 5.5 Gy in 1 fraction directed at the high risk CTV    ________________________________  Blair Promise, PhD, MD

## 2015-12-27 NOTE — Anesthesia Postprocedure Evaluation (Signed)
Anesthesia Post Note  Patient: Valerie Figueroa  Procedure(s) Performed: Procedure(s) (LRB): TANDEM RING INSERTION (N/A)  Patient location during evaluation: PACU Anesthesia Type: General Level of consciousness: awake Pain management: pain level controlled Vital Signs Assessment: post-procedure vital signs reviewed and stable Respiratory status: spontaneous breathing Cardiovascular status: stable Postop Assessment: no signs of nausea or vomiting Anesthetic complications: no    Last Vitals:  Filed Vitals:   12/26/15 0915 12/26/15 0917  BP: 117/70 117/70  Pulse: 78 78  Temp: 36.7 C 36.7 C  Resp: 12 12    Last Pain:  Filed Vitals:   12/26/15 0929  PainSc: 2                  Tilman Mcclaren

## 2015-12-29 NOTE — Anesthesia Preprocedure Evaluation (Addendum)
Anesthesia Evaluation  Patient identified by MRN, date of birth, ID band Patient awake    Reviewed: Allergy & Precautions, NPO status , Patient's Chart, lab work & pertinent test results  History of Anesthesia Complications Negative for: history of anesthetic complications  Airway Mallampati: III  TM Distance: >3 FB Neck ROM: Full    Dental  (+) Partial Upper,    Pulmonary neg shortness of breath, neg sleep apnea, neg COPD, neg recent URI, Current Smoker,    breath sounds clear to auscultation       Cardiovascular Exercise Tolerance: Good negative cardio ROS   Rhythm:Regular     Neuro/Psych negative neurological ROS  negative psych ROS   GI/Hepatic negative GI ROS, Neg liver ROS,   Endo/Other  negative endocrine ROS  Renal/GU negative Renal ROS   CX CA    Musculoskeletal negative musculoskeletal ROS (+)   Abdominal   Peds  Hematology  (+) anemia , 10/31   Anesthesia Other Findings   Reproductive/Obstetrics                           Anesthesia Physical  Anesthesia Plan  ASA: II  Anesthesia Plan: General   Post-op Pain Management:    Induction: Intravenous  Airway Management Planned: LMA  Additional Equipment: None  Intra-op Plan:   Post-operative Plan: Extubation in OR  Informed Consent: I have reviewed the patients History and Physical, chart, labs and discussed the procedure including the risks, benefits and alternatives for the proposed anesthesia with the patient or authorized representative who has indicated his/her understanding and acceptance.   Dental advisory given  Plan Discussed with: CRNA and Surgeon  Anesthesia Plan Comments: (LMA 4 last treatment)        Anesthesia Quick Evaluation

## 2016-01-01 ENCOUNTER — Other Ambulatory Visit: Payer: Self-pay | Admitting: Radiation Oncology

## 2016-01-01 DIAGNOSIS — C539 Malignant neoplasm of cervix uteri, unspecified: Secondary | ICD-10-CM

## 2016-01-02 ENCOUNTER — Encounter (HOSPITAL_BASED_OUTPATIENT_CLINIC_OR_DEPARTMENT_OTHER): Payer: Self-pay | Admitting: *Deleted

## 2016-01-02 NOTE — Progress Notes (Signed)
NPO AFTER MN.  ARRIVE AT 0600.  NEEDS CBC AND BMET.

## 2016-01-04 ENCOUNTER — Ambulatory Visit
Admission: RE | Admit: 2016-01-04 | Discharge: 2016-01-04 | Disposition: A | Discharge status: Home / Self Care | Payer: BLUE CROSS/BLUE SHIELD | Source: Ambulatory Visit | Attending: Radiation Oncology | Admitting: Radiation Oncology

## 2016-01-04 ENCOUNTER — Ambulatory Visit (HOSPITAL_BASED_OUTPATIENT_CLINIC_OR_DEPARTMENT_OTHER): Payer: BLUE CROSS/BLUE SHIELD | Admitting: Anesthesiology

## 2016-01-04 ENCOUNTER — Encounter (HOSPITAL_BASED_OUTPATIENT_CLINIC_OR_DEPARTMENT_OTHER)
Admission: RE | Disposition: A | Discharge status: Other Acute Inpatient Hospital | Payer: Self-pay | Source: Ambulatory Visit | Attending: Radiation Oncology

## 2016-01-04 ENCOUNTER — Ambulatory Visit (HOSPITAL_COMMUNITY)
Admission: RE | Admit: 2016-01-04 | Discharge: 2016-01-04 | Disposition: A | Discharge status: Home / Self Care | Payer: BLUE CROSS/BLUE SHIELD | Source: Ambulatory Visit | Attending: Radiation Oncology | Admitting: Radiation Oncology

## 2016-01-04 ENCOUNTER — Ambulatory Visit
Admit: 2016-01-04 | Discharge: 2016-01-04 | Disposition: A | Discharge status: Home / Self Care | Payer: BLUE CROSS/BLUE SHIELD | Attending: Radiation Oncology | Admitting: Radiation Oncology

## 2016-01-04 ENCOUNTER — Ambulatory Visit (HOSPITAL_COMMUNITY): Payer: BLUE CROSS/BLUE SHIELD

## 2016-01-04 ENCOUNTER — Encounter (HOSPITAL_BASED_OUTPATIENT_CLINIC_OR_DEPARTMENT_OTHER): Payer: Self-pay | Admitting: *Deleted

## 2016-01-04 ENCOUNTER — Ambulatory Visit (HOSPITAL_BASED_OUTPATIENT_CLINIC_OR_DEPARTMENT_OTHER)
Admission: RE | Admit: 2016-01-04 | Discharge: 2016-01-04 | Disposition: A | Discharge status: Other Acute Inpatient Hospital | Payer: BLUE CROSS/BLUE SHIELD | Source: Ambulatory Visit | Attending: Radiation Oncology | Admitting: Radiation Oncology

## 2016-01-04 VITALS — BP 134/63 | HR 80

## 2016-01-04 DIAGNOSIS — F17210 Nicotine dependence, cigarettes, uncomplicated: Secondary | ICD-10-CM | POA: Diagnosis not present

## 2016-01-04 DIAGNOSIS — Z923 Personal history of irradiation: Secondary | ICD-10-CM | POA: Diagnosis not present

## 2016-01-04 DIAGNOSIS — Z79899 Other long term (current) drug therapy: Secondary | ICD-10-CM | POA: Insufficient documentation

## 2016-01-04 DIAGNOSIS — R011 Cardiac murmur, unspecified: Secondary | ICD-10-CM | POA: Insufficient documentation

## 2016-01-04 DIAGNOSIS — C539 Malignant neoplasm of cervix uteri, unspecified: Secondary | ICD-10-CM

## 2016-01-04 DIAGNOSIS — D649 Anemia, unspecified: Secondary | ICD-10-CM | POA: Insufficient documentation

## 2016-01-04 DIAGNOSIS — Z510 Encounter for antineoplastic radiation therapy: Secondary | ICD-10-CM | POA: Diagnosis not present

## 2016-01-04 HISTORY — PX: TANDEM RING INSERTION: SHX6199

## 2016-01-04 HISTORY — DX: Iron deficiency anemia, unspecified: D50.9

## 2016-01-04 HISTORY — DX: Personal history of other diseases of the female genital tract: Z87.42

## 2016-01-04 HISTORY — DX: Malignant neoplasm of cervix uteri, unspecified: C53.9

## 2016-01-04 LAB — BASIC METABOLIC PANEL
ANION GAP: 11 (ref 5–15)
BUN: 12 mg/dL (ref 6–20)
CALCIUM: 9.9 mg/dL (ref 8.9–10.3)
CO2: 21 mmol/L — AB (ref 22–32)
Chloride: 106 mmol/L (ref 101–111)
Creatinine, Ser: 0.86 mg/dL (ref 0.44–1.00)
GFR calc non Af Amer: >60 mL/min (ref >60)
Glucose, Bld: 112 mg/dL — ABNORMAL HIGH (ref 65–99)
POTASSIUM: 3.8 mmol/L (ref 3.5–5.1)
Sodium: 138 mmol/L (ref 135–145)

## 2016-01-04 LAB — CBC
HCT: 30.8 % — ABNORMAL LOW (ref 36.0–46.0)
HEMOGLOBIN: 10.4 g/dL — AB (ref 12.0–15.0)
MCH: 33.7 pg (ref 26.0–34.0)
MCHC: 33.8 g/dL (ref 30.0–36.0)
MCV: 99.7 fL (ref 78.0–100.0)
Platelets: 100 10*3/uL — ABNORMAL LOW (ref 150–400)
RBC: 3.09 MIL/uL — AB (ref 3.87–5.11)
RDW: 20.9 % — AB (ref 11.5–15.5)
WBC: 6 10*3/uL (ref 4.0–10.5)

## 2016-01-04 SURGERY — INSERTION, UTERINE TANDEM AND RING OR CYLINDER, FOR BRACHYTHERAPY
Anesthesia: General

## 2016-01-04 MED ORDER — KETOROLAC TROMETHAMINE 30 MG/ML IJ SOLN
INTRAMUSCULAR | Status: AC
Start: 2016-01-04 — End: 2016-01-04
  Filled 2016-01-04: qty 2

## 2016-01-04 MED ORDER — PROPOFOL 10 MG/ML IV BOLUS
INTRAVENOUS | Status: AC
Start: 2016-01-04 — End: 2016-01-04
  Filled 2016-01-04: qty 40

## 2016-01-04 MED ORDER — MIDAZOLAM HCL 5 MG/5ML IJ SOLN
INTRAMUSCULAR | Status: DC | PRN
  Administered 2016-01-04: 2 mg via INTRAVENOUS

## 2016-01-04 MED ORDER — PROPOFOL 500 MG/50ML IV EMUL
INTRAVENOUS | Status: DC | PRN
  Administered 2016-01-04: 150 mL via INTRAVENOUS

## 2016-01-04 MED ORDER — LIDOCAINE HCL (CARDIAC) 20 MG/ML IV SOLN
INTRAVENOUS | Status: AC
  Filled 2016-01-04: qty 5

## 2016-01-04 MED ORDER — HYDROMORPHONE HCL 1 MG/ML IJ SOLN
0.5000 mg | Freq: Once | INTRAMUSCULAR | Status: AC
  Administered 2016-01-04: 0.5 mg via INTRAVENOUS
  Filled 2016-01-04: qty 1

## 2016-01-04 MED ORDER — PROMETHAZINE HCL 25 MG/ML IJ SOLN
6.2500 mg | INTRAMUSCULAR | Status: DC | PRN
  Filled 2016-01-04: qty 1

## 2016-01-04 MED ORDER — KETOROLAC TROMETHAMINE 30 MG/ML IJ SOLN
INTRAMUSCULAR | Status: DC | PRN
  Administered 2016-01-04: 30 mg via INTRAVENOUS

## 2016-01-04 MED ORDER — LACTATED RINGERS IV SOLN
INTRAVENOUS | Status: DC
  Administered 2016-01-04: 06:00:00 via INTRAVENOUS
  Filled 2016-01-04: qty 1000

## 2016-01-04 MED ORDER — KETOROLAC TROMETHAMINE 30 MG/ML IJ SOLN
INTRAMUSCULAR | Status: AC
  Filled 2016-01-04: qty 1

## 2016-01-04 MED ORDER — DEXAMETHASONE SODIUM PHOSPHATE 10 MG/ML IJ SOLN
INTRAMUSCULAR | Status: DC | PRN
  Administered 2016-01-04: 10 mg via INTRAVENOUS

## 2016-01-04 MED ORDER — DEXAMETHASONE SODIUM PHOSPHATE 10 MG/ML IJ SOLN
INTRAMUSCULAR | Status: AC
Start: 2016-01-04 — End: 2016-01-04
  Filled 2016-01-04: qty 1

## 2016-01-04 MED ORDER — MEPERIDINE HCL 25 MG/ML IJ SOLN
6.2500 mg | INTRAMUSCULAR | Status: DC | PRN
  Filled 2016-01-04: qty 1

## 2016-01-04 MED ORDER — PHENYLEPHRINE 40 MCG/ML (10ML) SYRINGE FOR IV PUSH (FOR BLOOD PRESSURE SUPPORT)
PREFILLED_SYRINGE | INTRAVENOUS | Status: AC
  Filled 2016-01-04: qty 10

## 2016-01-04 MED ORDER — LACTATED RINGERS IV SOLN
INTRAVENOUS | Status: DC
  Administered 2016-01-04: 09:00:00 via INTRAVENOUS
  Filled 2016-01-04: qty 250

## 2016-01-04 MED ORDER — MIDAZOLAM HCL 2 MG/2ML IJ SOLN
INTRAMUSCULAR | Status: AC
  Filled 2016-01-04: qty 2

## 2016-01-04 MED ORDER — PROPOFOL 500 MG/50ML IV EMUL
INTRAVENOUS | Status: AC
Start: 2016-01-04 — End: 2016-01-04
  Filled 2016-01-04: qty 50

## 2016-01-04 MED ORDER — WATER FOR IRRIGATION, STERILE IR SOLN
Status: DC | PRN
  Administered 2016-01-04: 500 mL
  Administered 2016-01-04: 3000 mL via INTRAVESICAL

## 2016-01-04 MED ORDER — FENTANYL CITRATE (PF) 100 MCG/2ML IJ SOLN
INTRAMUSCULAR | Status: DC | PRN
  Administered 2016-01-04 (×2): 50 ug via INTRAVENOUS

## 2016-01-04 MED ORDER — LIDOCAINE HCL (CARDIAC) 20 MG/ML IV SOLN
INTRAVENOUS | Status: DC | PRN
  Administered 2016-01-04: 60 mg via INTRAVENOUS

## 2016-01-04 MED ORDER — ONDANSETRON HCL 4 MG/2ML IJ SOLN
INTRAMUSCULAR | Status: DC | PRN
  Administered 2016-01-04: 4 mg via INTRAVENOUS

## 2016-01-04 MED ORDER — FENTANYL CITRATE (PF) 100 MCG/2ML IJ SOLN
25.0000 ug | INTRAMUSCULAR | Status: DC | PRN
  Filled 2016-01-04: qty 1

## 2016-01-04 MED ORDER — PHENYLEPHRINE HCL 10 MG/ML IJ SOLN
INTRAMUSCULAR | Status: DC | PRN
  Administered 2016-01-04 (×2): 80 ug via INTRAVENOUS

## 2016-01-04 MED ORDER — ONDANSETRON HCL 4 MG/2ML IJ SOLN
INTRAMUSCULAR | Status: AC
  Filled 2016-01-04: qty 2

## 2016-01-04 MED ORDER — FENTANYL CITRATE (PF) 100 MCG/2ML IJ SOLN
INTRAMUSCULAR | Status: AC
  Filled 2016-01-04: qty 4

## 2016-01-04 SURGICAL SUPPLY — 33 items
BAG URINE DRAINAGE (UROLOGICAL SUPPLIES) ×3 IMPLANT
BNDG CONFORM 2 STRL LF (GAUZE/BANDAGES/DRESSINGS) IMPLANT
CATH FOLEY 2WAY SLVR  5CC 16FR (CATHETERS) ×2
CATH FOLEY 2WAY SLVR 5CC 16FR (CATHETERS) ×1 IMPLANT
COVER BACK TABLE 60X90IN (DRAPES) ×3 IMPLANT
DRAPE LG THREE QUARTER DISP (DRAPES) ×3 IMPLANT
DRAPE UNDERBUTTOCKS STRL (DRAPE) ×3 IMPLANT
GLOVE BIO SURGEON STRL SZ7.5 (GLOVE) ×6 IMPLANT
GOWN STRL REUS W/ TWL LRG LVL3 (GOWN DISPOSABLE) ×2 IMPLANT
GOWN STRL REUS W/TWL LRG LVL3 (GOWN DISPOSABLE) ×4
HOLDER FOLEY CATH W/STRAP (MISCELLANEOUS) ×3 IMPLANT
KIT ROOM TURNOVER WOR (KITS) ×3 IMPLANT
LEGGING LITHOTOMY PAIR STRL (DRAPES) ×3 IMPLANT
NEEDLE SPNL 22GX3.5 QUINCKE BK (NEEDLE) IMPLANT
PACK BASIN DAY SURGERY FS (CUSTOM PROCEDURE TRAY) ×3 IMPLANT
PACKING VAGINAL (PACKING) ×3 IMPLANT
PAD ABD 8X10 STRL (GAUZE/BANDAGES/DRESSINGS) ×3 IMPLANT
PAD OB MATERNITY 4.3X12.25 (PERSONAL CARE ITEMS) IMPLANT
PAD PREP 24X48 CUFFED NSTRL (MISCELLANEOUS) ×3 IMPLANT
PLUG CATH AND CAP STER (CATHETERS) IMPLANT
SET IRRIG Y TYPE TUR BLADDER L (SET/KITS/TRAYS/PACK) ×3 IMPLANT
SUT PROLENE 0 SH 30 (SUTURE) IMPLANT
SUT SILK 2 0 30  PSL (SUTURE)
SUT SILK 2 0 30 PSL (SUTURE) IMPLANT
SYR BULB IRRIGATION 50ML (SYRINGE) IMPLANT
SYR CONTROL 10ML LL (SYRINGE) IMPLANT
SYRINGE 10CC LL (SYRINGE) ×3 IMPLANT
TOWEL OR 17X24 6PK STRL BLUE (TOWEL DISPOSABLE) ×6 IMPLANT
TRAY DSU PREP LF (CUSTOM PROCEDURE TRAY) ×3 IMPLANT
TUBE CONNECTING 12'X1/4 (SUCTIONS)
TUBE CONNECTING 12X1/4 (SUCTIONS) IMPLANT
WATER STERILE IRR 3000ML UROMA (IV SOLUTION) ×3 IMPLANT
WATER STERILE IRR 500ML POUR (IV SOLUTION) ×3 IMPLANT

## 2016-01-04 NOTE — Progress Notes (Signed)
Received report from Ivin Booty, [place] in PACU.  Patient has foley catheter draining clear, yellow urine.  She has a #20 IV in her right hand with LR infusing to gravity.  Transported to Radiation nursing area via stretcher.  Converted IV tubing to pump tubing.  LR now infusing at 125 ml/hr through right hand IV.  SCD's on.  Call light in reach.  Will continue to monitor.

## 2016-01-04 NOTE — Progress Notes (Signed)
Patient reports pain is much better.  Assisted Dr. Sondra Come with tandem removal.  Removed right hand IV intact.  Pressure and bandaid applied.  Helped patient with dressing.  She was given discharge instructions and paperwork.  Escorted patient to waiting room to meet her husband.

## 2016-01-04 NOTE — Progress Notes (Signed)
  Radiation Oncology         [phone]) [phone] ________________________________  Name: Valerie Figueroa MRN: [id number]  Date: 01/04/2016  DOB: [birth year]  SIMULATION AND TREATMENT PLANNING NOTE HDR BRACHYTHERAPY  DIAGNOSIS: Stage IIIB cervical cancer  NARRATIVE:  The patient was brought to the [facility].  Identity was confirmed.  All relevant records and images related to the planned course of therapy were reviewed.  The patient freely provided informed written consent to proceed with treatment after reviewing the details related to the planned course of therapy. The consent form was witnessed and verified by the simulation staff.  Then, the patient was set-up in a stable reproducible  supine position for radiation therapy.  CT images were obtained.  Surface markings were placed.  The CT images were loaded into the planning software.  Then the target and avoidance structures were contoured.  Treatment planning then occurred.  The radiation prescription was entered and confirmed.   I have requested : Brachytherapy Isodose Plan and Dosimetry Calculations to plan the radiation distribution.    PLAN:  The patient will receive 5.5 Gy in 1 fraction directed at the high risk CTV.  ________________________________  Blair Promise, PhD, MD  This document serves as a record of services personally performed by Gery Pray, MD. It was created on his behalf by Darcus Austin, a trained medical scribe. The creation of this record is based on the scribe's personal observations and the provider's statements to them. This document has been checked and approved by the attending provider.

## 2016-01-04 NOTE — Progress Notes (Signed)
  Radiation Oncology         (336) [phone] ________________________________  Name: Valerie Figueroa MRN: [id number]  Date: 01/04/2016  DOB: July 28, 1956  CC: [name], MD  Nancy Marus, MD  HDR BRACHYTHERAPY NOTE  DIAGNOSIS: Stage IIIB cervical cancer  NARRATIVE:  Simple treatment device note:  in the operating room the patient had construction of her custom tandem/ring system. She will be treated with a 60 mm, 60 tandem and 60 ring/paddle system  The patient was brought to the HDR suite. Identity was confirmed. All relevant records and images related to the planned course of therapy were reviewed. The patient freely provided informed written consent to proceed with treatment after reviewing the details related to the planned course of therapy. The consent form was witnessed and verified by the simulation staff. Then, the patient was set-up in a stable reproducible supine position for radiation therapy. The tandem ring system was accessed and fiducial markers were placed within the tandem and ring.   Verification simulation note: An AP and lateral film was obtained through the pelvis area. This was compared to the patient's planning films documenting accurate position of the tandem/ring system for treatment.  High-dose-rate brachytherapy treatment note:  The remote afterloading device was accessed through catheter system and attached to the tandem ring system. Patient then proceeded to undergo her 2nd high-dose-rate treatment directed at the cervix. The patient was prescribed a dose of 5.5 gray to be delivered to the [place]. Patient was treated with 2 channels using multiple dwell positions. Treatment time was 322.2 seconds. The patient tolerated the procedure well. After completion of her therapy, a radiation survey was performed documenting return of the iridium source into the GammaMed safe. The patient was then transferred to the nursing suite.  She then had removal of the  rectal paddle followed by the tandem and ring system.  the patient tolerated the removal well.  PLAN: Patient will return next week for her third high-dose-rate treatment ________________________________  Blair Promise, PhD, MD  This document serves as a record of services personally performed by Gery Pray, MD. It was created on his behalf by Darcus Austin, a trained medical scribe. The creation of this record is based on the scribe's personal observations and the provider's statements to them. This document has been checked and approved by the attending provider.

## 2016-01-04 NOTE — Anesthesia Postprocedure Evaluation (Signed)
Anesthesia Post Note  Patient: Valerie Figueroa  Procedure(s) Performed: Procedure(s) (LRB): TANDEM RING INSERTION (N/A)  Patient location during evaluation: PACU Anesthesia Type: General Level of consciousness: awake and alert Pain management: pain level controlled Vital Signs Assessment: post-procedure vital signs reviewed and stable Respiratory status: spontaneous breathing, nonlabored ventilation, respiratory function stable and patient connected to nasal cannula oxygen Cardiovascular status: blood pressure returned to baseline and stable Postop Assessment: no signs of nausea or vomiting Anesthetic complications: no    Last Vitals:  Filed Vitals:   01/04/16 0815 01/04/16 0820  BP:    Pulse: 76   Temp:  37.2 C  Resp: 17     Last Pain: There were no vitals filed for this visit.               Allyah Heather

## 2016-01-04 NOTE — Brief Op Note (Signed)
01/04/2016  8:27 AM  PATIENT:  Valerie Figueroa  60 y.o. female  PRE-OPERATIVE DIAGNOSIS:  cervical cancer  POST-OPERATIVE DIAGNOSIS:  cervical cancer  PROCEDURE:  Procedure(s): TANDEM RING INSERTION (N/A)  SURGEON:  Surgeon(s) and Role:    * Gery Pray, MD - Primary  PHYSICIAN ASSISTANT:   ASSISTANTS: none   ANESTHESIA:   general  EBL:  Total I/O In: 300 [I.V.:300] Out: 100 [Urine:100]  BLOOD ADMINISTERED:none  DRAINS: Urinary Catheter (Foley)   LOCAL MEDICATIONS USED:  NONE  SPECIMEN:  No Specimen  DISPOSITION OF SPECIMEN:  N/A  COUNTS:  YES  TOURNIQUET:  * No tourniquets in log *  DICTATION: Patient was taken to the operating room. Timeout was performed. The patient was prepped and draped in the usual sterile fashion and placed in the dorsolithotomy position. ultrasound was established. Approximately 200 mL of sterile water was placed in the bladder for imaging purposes.   On inspection the cervical sleeve placed last week by Dr. Denman George was noted to be in good position.  The patient then had a 60 mm, 60 tandem placed within the uterine canal and within the cervical sleeve. Ultrasound revealed good placement of the tandem within the uterine canal and within the cervical sleeve . A 60 ring with minimal shielding was placed along the cervical area. A rectal paddle was also placed.   The patient tolerated the procedure well  PLAN OF CARE: Transfer to radiation oncology for planning and treatment  PATIENT DISPOSITION:  PACU - hemodynamically stable.   Delay start of Pharmacological VTE agent (>24hrs) due to surgical blood loss or risk of bleeding: not applicable

## 2016-01-04 NOTE — Anesthesia Procedure Notes (Signed)
Procedure Name: LMA Insertion Date/Time: 01/04/2016 7:30 AM Performed by: Wanita Chamberlain Pre-anesthesia Checklist: Patient identified, Timeout performed, Emergency Drugs available, Suction available and Patient being monitored Patient Re-evaluated:Patient Re-evaluated prior to inductionOxygen Delivery Method: Circle system utilized Preoxygenation: Pre-oxygenation with 100% oxygen Intubation Type: IV induction Ventilation: Mask ventilation without difficulty LMA: LMA inserted LMA Size: 4.0 Number of attempts: 1 Airway Equipment and Method: Bite block Placement Confirmation: positive ETCO2 and breath sounds checked- equal and bilateral Tube secured with: Tape Dental Injury: Teeth and Oropharynx as per pre-operative assessment

## 2016-01-04 NOTE — H&P (View-Only) (Signed)
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 female who is   currently undergoing definitive radiation and radiosensitizing chemotherapy. She now ready to undergo her first brachytherapy procedure. She is tolerating her treatment well.     PAST MEDICAL HISTORY:  has a past medical history of Anemia; Cervical cancer ([place]); Radiation (08/31/15-09/04/15); and Heart murmur.    PAST SURGICAL HISTORY: Past Surgical History  Procedure Laterality Date  . Ovarian cyst removal Left 01-17-1985  . Carpal tunnel release Bilateral   . Tubal ligation      FAMILY HISTORY: family history includes Cancer in her father.  SOCIAL HISTORY:  reports that she has been smoking Cigarettes.  She has a 15 pack-year smoking history. She has never used smokeless tobacco. She reports that she does not drink alcohol or use illicit drugs.  ALLERGIES: Paclitaxel  MEDICATIONS:  No current facility-administered medications for this encounter.   Current Outpatient Prescriptions  Medication Sig Dispense Refill  . acetaminophen (TYLENOL) 500 MG tablet Take 1,000 mg by mouth every 8 (eight) hours as needed for mild pain.     Marland Kitchen albuterol (PROVENTIL HFA;VENTOLIN HFA) 108 (90 BASE) MCG/ACT inhaler Inhale 2 puffs into the lungs every 6 (six) hours as needed for wheezing or shortness of breath. 1 Inhaler 2  . Fe Fum-FA-B Cmp-C-Zn-Mg-Mn-Cu (CENTRATEX) 106-1 MG CAPS Take 1 capsule by mouth daily.    Marland Kitchen loperamide (IMODIUM) 2 MG capsule Take 2 mg by mouth daily as needed for diarrhea or loose stools.     Marland Kitchen LORazepam (ATIVAN) 0.5 MG tablet Place 1 tablet under the tongue or swallow every 6 hours as needed for nausea. Will make drowsy. 20 tablet 0  .  Multiple Vitamins-Minerals (CENTRUM ULTRA WOMENS) TABS Take 1 tablet by mouth every morning.    . ondansetron (ZOFRAN) 8 MG tablet Take 1 tablet (8 mg total) by mouth every 8 (eight) hours as needed for nausea or vomiting. 30 tablet 1  . polyethylene glycol (MIRALAX / GLYCOLAX) packet Take 17 g by mouth daily as needed (constipation). Reported on 12/19/2015    . traMADol (ULTRAM) 50 MG tablet Take 1 tablet (50 mg total) by mouth every 8 (eight) hours as needed. (Patient not taking: Reported on 12/19/2015) 20 tablet 0  . loratadine (CLARITIN) 10 MG tablet Take 10 mg by mouth daily. [employer]      REVIEW OF SYSTEMS:  A 15 point review of systems is documented in the electronic medical record. This was obtained by the nursing staff. However, I reviewed this with the patient to discuss relevant findings and make appropriate changes.  Some fatigue, no pelvic pain or vaginal bleeding   PHYSICAL EXAM: BP 127/76 mmHg  Pulse 85  Temp(Src) 98.3 F (36.8 C)  Ht '5\' 4"'$  (1.626 m)  Wt 144 lb 9.6 oz (65.59 kg)  BMI 24.81 kg/m2   Well-nourished, well-developed female in no acute distress. Neck: Supple, no lymphadenopathy no thyromegaly. No palpable cervical or supraclavicular adenopathy. Mouth: Partial on top; few teeth missing on bottom. Lungs: Clear to auscultation. Cardiac: Regular rate and rhythm. Abdomen: . Abdomen is soft, nontender, nondistended. There are no palpable masses or hepatosplenomegaly. Groins: No lymphadenopathy. Extremities:  No peripheral edema. Pulses are good. Skin: Birth mark from right flank to right lower back area. 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     ------------------------------------------------  Blair Promise, PhD, MD

## 2016-01-04 NOTE — Progress Notes (Signed)
IMMEDIATELY FOLLOWING SURGERY: Do not drive or operate machinery for the first twenty four hours after surgery. Do not make any important decisions for twenty four hours after surgery or while taking narcotic pain medications or sedatives. If you develop intractable nausea and vomiting or a severe headache please notify your doctor immediately.   FOLLOW-UP: You do not need to follow up with anesthesia unless specifically instructed to do so.   WOUND CARE INSTRUCTIONS (if applicable): Expect some mild vaginal bleeding, but if large amount of bleeding occurs please contact Dr. Sondra Come at [phone] or the Radiation On-Call physician. Call for any fever greater than 101.0 degrees or increasing vaginal//abdominal pain or trouble urinating.   QUESTIONS?: Please feel free to call your physician or the hospital operator if you have any questions, and they will be happy to assist you. Resume all medications: as listed on your after visit summary. Your next appointment is:  Future Appointments Date Time Provider [place]  01/08/2016 8:15 AM [facility] 6 [facility] None  01/08/2016 9:00 AM Lennis P Livesay, MD [facility] None  01/11/2016 7:00 AM [facility] 1 [facility] [place]  01/11/2016 10:00 AM Gery Pray, MD [facility] None  01/11/2016 2:00 PM Gery Pray, MD [facility] None  01/17/2016 8:00 AM [facility] 1 [facility] [place]  01/17/2016 11:00 AM Gery Pray, MD [facility] None  01/17/2016 3:00 PM Gery Pray, MD [facility] None  01/25/2016 7:00 AM [facility] 1 [facility] [place]  01/25/2016 10:00 AM Gery Pray, MD [facility] None  01/25/2016 2:00 PM Gery Pray, MD [facility] None  02/28/2016 10:45 AM Nancy Marus, MD [facility] None

## 2016-01-04 NOTE — Transfer of Care (Signed)
Immediate Anesthesia Transfer of Care Note  Patient: Valerie Figueroa  Procedure(s) Performed: Procedure(s): TANDEM RING INSERTION (N/A)  Patient Location: PACU  Anesthesia Type:General  Level of Consciousness: awake, alert , oriented and patient cooperative  Airway & Oxygen Therapy: Patient Spontanous Breathing and Patient connected to nasal cannula oxygen  Post-op Assessment: Report given to RN and Post -op Vital signs reviewed and stable  Post vital signs: Reviewed and stable  Last Vitals:  Filed Vitals:   01/04/16 0615  BP: 164/89  Pulse: 119  Temp: 36.9 C  Resp: 16    Complications: No apparent anesthesia complications

## 2016-01-04 NOTE — Progress Notes (Signed)
Patient reports burning pain in her vaginal area and is rating it at a 5/10.  0.5 mg dilaudid IV given per Dr. Sondra Come.  Will continue to monitor.

## 2016-01-04 NOTE — Interval H&P Note (Signed)
History and Physical Interval Note:  01/04/2016 7:34 AM  Valerie Figueroa  has presented today for surgery, with the diagnosis of cervical cancer  The various methods of treatment have been discussed with the patient and family. After consideration of risks, benefits and other options for treatment, the patient has consented to  Procedure(s): TANDEM RING INSERTION (N/A) as a surgical intervention .  The patient's history has been reviewed, patient examined, no change in status, stable for surgery.  I have reviewed the patient's chart and labs.  Questions were answered to the patient's satisfaction.     Gery Pray

## 2016-01-04 NOTE — Progress Notes (Signed)
Patient reporting pain at a 5/10 in her vaginal area.  0.5 mg dilaudid given IV per Dr. Sondra Come.  Foley catheter removed intact.

## 2016-01-04 NOTE — Progress Notes (Signed)
Patient reports pain is much better now and is reporting pain is now a 3/10.  Will continue to monitor.

## 2016-01-05 ENCOUNTER — Encounter (HOSPITAL_BASED_OUTPATIENT_CLINIC_OR_DEPARTMENT_OTHER): Payer: Self-pay | Admitting: Radiation Oncology

## 2016-01-05 NOTE — Addendum Note (Signed)
Encounter addended by: Jacqulyn Liner, RN on: 01/05/2016  8:12 AM<BR>     Documentation filed: Charges VN

## 2016-01-05 NOTE — Progress Notes (Signed)
NPO AFTER MN.  ARRIVE AT 0600.  CURRENT LAB RESULTS IN CHART AND EPIC.  PRE-OP ORDERS PENDING.

## 2016-01-08 ENCOUNTER — Telehealth: Payer: Self-pay | Admitting: Oncology

## 2016-01-08 ENCOUNTER — Other Ambulatory Visit (HOSPITAL_BASED_OUTPATIENT_CLINIC_OR_DEPARTMENT_OTHER): Payer: BLUE CROSS/BLUE SHIELD

## 2016-01-08 ENCOUNTER — Encounter: Payer: Self-pay | Admitting: Oncology

## 2016-01-08 ENCOUNTER — Ambulatory Visit (HOSPITAL_BASED_OUTPATIENT_CLINIC_OR_DEPARTMENT_OTHER): Payer: BLUE CROSS/BLUE SHIELD | Admitting: Oncology

## 2016-01-08 VITALS — BP 134/74 | HR 94 | Temp 98.1°F | Resp 18 | Ht 64.0 in | Wt 142.8 lb

## 2016-01-08 DIAGNOSIS — R921 Mammographic calcification found on diagnostic imaging of breast: Secondary | ICD-10-CM

## 2016-01-08 DIAGNOSIS — C539 Malignant neoplasm of cervix uteri, unspecified: Secondary | ICD-10-CM | POA: Diagnosis not present

## 2016-01-08 DIAGNOSIS — D500 Iron deficiency anemia secondary to blood loss (chronic): Secondary | ICD-10-CM | POA: Diagnosis not present

## 2016-01-08 DIAGNOSIS — Z720 Tobacco use: Secondary | ICD-10-CM

## 2016-01-08 LAB — MAGNESIUM: MAGNESIUM: 2.4 mg/dL (ref 1.5–2.5)

## 2016-01-08 LAB — COMPREHENSIVE METABOLIC PANEL
ALT: 26 U/L (ref 0–55)
AST: 18 U/L (ref 5–34)
Albumin: 3.8 g/dL (ref 3.5–5.0)
Alkaline Phosphatase: 96 U/L (ref 40–150)
Anion Gap: 9 mEq/L (ref 3–11)
BUN: 15.2 mg/dL (ref 7.0–26.0)
CALCIUM: 9.4 mg/dL (ref 8.4–10.4)
CHLORIDE: 108 meq/L (ref 98–109)
CO2: 20 mEq/L — ABNORMAL LOW (ref 22–29)
CREATININE: 0.8 mg/dL (ref 0.6–1.1)
EGFR: 78 mL/min/{1.73_m2} — ABNORMAL LOW (ref >90)
Glucose: 96 mg/dl (ref 70–140)
Potassium: 4.3 mEq/L (ref 3.5–5.1)
SODIUM: 137 meq/L (ref 136–145)
Total Bilirubin: <0.3 mg/dL (ref 0.20–1.20)
Total Protein: 7 g/dL (ref 6.4–8.3)

## 2016-01-08 LAB — CBC WITH DIFFERENTIAL/PLATELET
BASO%: 0.4 % (ref 0.0–2.0)
Basophils Absolute: 0 10*3/uL (ref 0.0–0.1)
EOS%: 0.8 % (ref 0.0–7.0)
Eosinophils Absolute: 0 10*3/uL (ref 0.0–0.5)
HEMATOCRIT: 28.8 % — AB (ref 34.8–46.6)
HEMOGLOBIN: 9.9 g/dL — AB (ref 11.6–15.9)
LYMPH#: 0.9 10*3/uL (ref 0.9–3.3)
LYMPH%: 14.6 % (ref 14.0–49.7)
MCH: 34 pg (ref 25.1–34.0)
MCHC: 34.3 g/dL (ref 31.5–36.0)
MCV: 99.2 fL (ref 79.5–101.0)
MONO#: 0.8 10*3/uL (ref 0.1–0.9)
MONO%: 13.3 % (ref 0.0–14.0)
NEUT#: 4.5 10*3/uL (ref 1.5–6.5)
NEUT%: 70.9 % (ref 38.4–76.8)
Platelets: 129 10*3/uL — ABNORMAL LOW (ref 145–400)
RBC: 2.91 10*6/uL — ABNORMAL LOW (ref 3.70–5.45)
RDW: 22 % — AB (ref 11.2–14.5)
WBC: 6.4 10*3/uL (ref 3.9–10.3)

## 2016-01-08 MED ORDER — FERROUS FUMARATE-FOLIC ACID 324-1 MG PO TABS: ORAL_TABLET | ORAL | Status: DC

## 2016-01-08 NOTE — Telephone Encounter (Signed)
Appointments made and avs printed for pateint °

## 2016-01-08 NOTE — Progress Notes (Signed)
OFFICE PROGRESS NOTE   January 10, 2016   [place], [place], Angelyn Punt, MD, Gery Pray  INTERVAL HISTORY:  Patient is seen, alone for visit, in continuing attention to IIIB squamous cell carcinoma of cervix, recently completing 5 cycles of weekly CDDP with IMRT. She continues HDR, planned thru 01-25-16. She is to see Dr Alycia Rossetti on 02-28-16.  Patient has felt progressively better since completing chemoradiation, with bowels now nearly normal and only occasional nausea without vomiting. Bowel movements are mostly formed, tho does tend to have diarrhea possibly associated with [employer] Breakfast (no known prior lactose intolerance). She needed antiemetics once in past week. She has no pain and no bleeding other than just after instrumentation for HDR treatments. Bladder is ok other than just after HDR treatments. Energy is better tho not back to baseline. She is able to sleep. She denies increased SOB or cough, continues to work down on smoking, presently at 4-5 cigarettes daily; we have discussed DC entirely which she does want to do. No fever or symptoms of infection. No LE swelling. No abdominal or pelvic pain.   Remainder of 10 point Review of Systems negative/ unchanged.  No PAC Flu vaccine 09-01-15  ONCOLOGIC HISTORY Patient has had no regular medical care in years, including no PAP in >15 years. She had no bleeding since menopause until several hours of heavy vaginal bleeding in 04-2015, which resolved other than intermittent spotting until extremely heavy vaginal bleeding again 08-08-15. She was seen in [facility] ED then, with Hgb 8.0; she was given premarin and continued on oral megace x 2 weeks. She was seen by Dr Mallory Shirk on 08-16-15 , with finding of large fungating cervical mass with parametrial involvement. Cervical biopsy 08-17-15 ([id number]) showed moderately to poorly differentiated squamous cell carcinoma. She had CT CAP in [facility] system on 08-21-15, with 1.7  cm oval mass upper outer right breast, multiple prominent mediastinal lymph nodes and multiiple bilateral pulmonary nodules including a 2.7 x 1.8 cm nodule in RLL., retroperitoneal and external iliac adenopathy, 4.2 x 5.2 x 6.1 cm mass involving cervix and lower uterine segment, 1 cm sclerotic lesion in lateral left fifth rib, liver ok and no ureteral obstruction. She saw Dr Alycia Rossetti on 08-23-15, who agreed with diagnosis of cervical cancer at least IIIB, possibly IV if pulmonary mets. PET pending, recommendation if stage IV to use short course RT to control bleeding then carbo taxol +/- avastin; if PET shows pelvis only then radiation with sensitizing CDDP. Patient saw Dr Sondra Come in consultation on 08-30-15, with vaginal packing required for heavy bleeding. She had urgent radiation thru 09-04-15. PET 09-06-15 had uptake in cervical mass, periaortic and iliac nodes, left supraclavicular node, left subpectoral and axillary nodes, and in right breast mass. Biposy of right breast 09-08-15 benign. Carboplatin taxol was attempted on 09-22-15, with acute, severe taxol reaction after ~ 2 ml taxol had infused, this despite full oral and IV premedication for taxol. Regimen was changed to CDDP gemzar, cycle 1 given 10-06-15 and cycle 2 on 10-19-15. Case was reviewed at multidisciplinary conference, with repeat CT chest favoring reactive nodes over metastatic disease. Decision was made to give definitive radiation with sensitizing CDDP.  She had 5 weekly cycles of CDDP from 11-02-15 thru 12-04-15 with IMRT. She continues HDR, planned thru 01-25-16.   Objective:  Vital signs in last 24 hours:  BP 134/74 mmHg  Pulse 94  Temp(Src) 98.1 F (36.7 C) (Oral)  Resp 18  Ht 5' 4" (1.626  m)  Wt 142 lb 12.8 oz (64.774 kg)  BMI 24.50 kg/m2  SpO2 100%  LMP 08/25/2015 Weight down 5 lbs. Alert, oriented and appropriate. Ambulatory without difficulty, looks comfortable. Respirations not labored.  No alopecia  HEENT:PERRL, sclerae not  icteric. Oral mucosa moist without lesions, posterior pharynx clear.  Neck supple. No JVD.  Lymphatics:no cervical,supraclavicular, axillary or inguinal adenopathy Resp: somewhat diminished BS thruout as is baseline, otherwise clear to auscultation bilaterally and no dullness to percussion bilaterally Cardio: regular rate and rhythm. No gallop. GI: soft, nontender, not distended, no mass or organomegaly. Normally active bowel sounds. Musculoskeletal/ Extremities: without pitting edema, cords, tenderness Neuro: no increase peripheral neuropathy. Otherwise nonfocal Skin without rash, ecchymosis, petechiae Breasts bilaterally without dominant mass,skin or nipple findings, including right breast in area of previous biopsy (fibroadenoma)  Lab Results:  Results for orders placed or performed in visit on 01/08/16  CBC with Differential  Result Value Ref Range   WBC 6.4 3.9 - 10.3 10e3/uL   NEUT# 4.5 1.5 - 6.5 10e3/uL   HGB 9.9 (L) 11.6 - 15.9 g/dL   HCT 28.8 (L) 34.8 - 46.6 %   Platelets 129 (L) 145 - 400 10e3/uL   MCV 99.2 79.5 - 101.0 fL   MCH 34.0 25.1 - 34.0 pg   MCHC 34.3 31.5 - 36.0 g/dL   RBC 2.91 (L) 3.70 - 5.45 10e6/uL   RDW 22.0 (H) 11.2 - 14.5 %   lymph# 0.9 0.9 - 3.3 10e3/uL   MONO# 0.8 0.1 - 0.9 10e3/uL   Eosinophils Absolute 0.0 0.0 - 0.5 10e3/uL   Basophils Absolute 0.0 0.0 - 0.1 10e3/uL   NEUT% 70.9 38.4 - 76.8 %   LYMPH% 14.6 14.0 - 49.7 %   MONO% 13.3 0.0 - 14.0 %   EOS% 0.8 0.0 - 7.0 %   BASO% 0.4 0.0 - 2.0 %  Comprehensive metabolic panel  Result Value Ref Range   Sodium 137 136 - 145 mEq/L   Potassium 4.3 3.5 - 5.1 mEq/L   Chloride 108 98 - 109 mEq/L   CO2 20 (L) 22 - 29 mEq/L   Glucose 96 70 - 140 mg/dl   BUN 15.2 7.0 - 26.0 mg/dL   Creatinine 0.8 0.6 - 1.1 mg/dL   Total Bilirubin <0.30 0.20 - 1.20 mg/dL   Alkaline Phosphatase 96 40 - 150 U/L   AST 18 5 - 34 U/L   ALT 26 0 - 55 U/L   Total Protein 7.0 6.4 - 8.3 g/dL   Albumin 3.8 3.5 - 5.0 g/dL    Calcium 9.4 8.4 - 10.4 mg/dL   Anion Gap 9 3 - 11 mEq/L   EGFR 78 (L) >90 ml/min/1.73 m2  Magnesium - [facility]  Result Value Ref Range   Magnesium 2.4 1.5 - 2.5 mg/dl     Studies/Results:  She is due follow up diagnostic right mammogram in March 2017, order placed by this MD now (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   Medications: I have reviewed the patient's current medications. Insurance will not cover name brand Hemocyte, RN to f/u with pharmacy for ferrous fumarate  DISCUSSION Interval history reviewed as above. Expect that she will continue to improve out from chemo and radiation. She is aware of appointment with Dr Alycia Rossetti in March,, will recheck labs when she is at [facility] for that visit. I will see her ~ April or sooner if needed. She will have f/u also with Dr Sondra Come likely a month after radiation completes. Any further scans  per gyn onc or rad onc.  Mammograms as above.  Smoking cessation discussed. Message to RN to follow up smoking/ possible nicotine patches 14 mg daily early Feb.  Assessment/Plan: 1. Squamous cell carcinoma cervix: moderately to poorly differentiated, initially thought metastic to paraaortic, chest and left supraclavicular nodes. Bleeding initially resolved with short course palliative radiation, then began chemotherapy. After anaphylaxis to taxol, regimen changed to CDDP gemzar, given 10-06-15 and 10-19-15. Repeat chest imaging 10-24-15 did not suggest metastatic disease, such that she has proceeded with definitive radiation given with sensitizing CDDP x 5 cycles from 11-10 thru 12-04-15 (counts too low for weekly cycle 6 CDDP). IMRT now completed and HDR in process. To see Dr Alycia Rossetti 02-28-16 with labs; I will see her ~ April 2.acute anaphylactic reaction to taxol 09-22-15 despite full premedication 3.long and ongoing tobacco, which she is tryiing to stop. Discussed and encouraged again. 4..iron deficiency anemia by iron studies Sept, due to heavy vaginal bleeding:  Transfused 2 units PRBCs + 554m feraheme on 09-11-15. Hemoglobin down below optimal for radiation in late Dec despite oral iron, so transfused 1 unit PRBCs 12-10-16. Continue ferrous fumarate, follow labs. 5..right breast mass upper outer quadrant: biopsy with benign findings, 6 month follow up diagnostic mammogram + [place] (March) 6. Radiation diarrhea nearly resolved. Never colonoscopy, which we will try to address after all treatment for the cervical cancer completed 7.post carpal tunnel release 8.sclerotic rib lesion on CT no correlate on PET 9.CAD by CT 10.cholelithiasis by CT 11.abnormal enhancement right renal collecting system by CT 12.peripheral venous access was adequate for chemo 13.[place]. 14.flu vaccine given 09-01-15   All questions answered. Time spent 25 min including >50% counseling and coordintation of care. Cc Dr FPatience Musca MD   01/10/2016, 10:56 AM

## 2016-01-10 DIAGNOSIS — R921 Mammographic calcification found on diagnostic imaging of breast: Secondary | ICD-10-CM | POA: Insufficient documentation

## 2016-01-11 ENCOUNTER — Ambulatory Visit
Admit: 2016-01-11 | Discharge: 2016-01-11 | Disposition: A | Discharge status: Home / Self Care | Payer: BLUE CROSS/BLUE SHIELD | Attending: Radiation Oncology | Admitting: Radiation Oncology

## 2016-01-11 ENCOUNTER — Encounter (HOSPITAL_BASED_OUTPATIENT_CLINIC_OR_DEPARTMENT_OTHER)
Admission: RE | Disposition: A | Discharge status: Other Acute Inpatient Hospital | Payer: Self-pay | Source: Ambulatory Visit | Attending: Radiation Oncology

## 2016-01-11 ENCOUNTER — Ambulatory Visit (HOSPITAL_BASED_OUTPATIENT_CLINIC_OR_DEPARTMENT_OTHER): Payer: BLUE CROSS/BLUE SHIELD | Admitting: Anesthesiology

## 2016-01-11 ENCOUNTER — Ambulatory Visit (HOSPITAL_COMMUNITY)
Admission: RE | Admit: 2016-01-11 | Discharge: 2016-01-11 | Disposition: A | Discharge status: Home / Self Care | Payer: BLUE CROSS/BLUE SHIELD | Source: Ambulatory Visit | Attending: Radiation Oncology | Admitting: Radiation Oncology

## 2016-01-11 ENCOUNTER — Encounter (HOSPITAL_BASED_OUTPATIENT_CLINIC_OR_DEPARTMENT_OTHER): Payer: Self-pay

## 2016-01-11 ENCOUNTER — Ambulatory Visit (HOSPITAL_BASED_OUTPATIENT_CLINIC_OR_DEPARTMENT_OTHER)
Admission: RE | Admit: 2016-01-11 | Discharge: 2016-01-11 | Disposition: A | Discharge status: Other Acute Inpatient Hospital | Payer: BLUE CROSS/BLUE SHIELD | Source: Ambulatory Visit | Attending: Radiation Oncology | Admitting: Radiation Oncology

## 2016-01-11 DIAGNOSIS — C539 Malignant neoplasm of cervix uteri, unspecified: Secondary | ICD-10-CM

## 2016-01-11 DIAGNOSIS — Z79899 Other long term (current) drug therapy: Secondary | ICD-10-CM | POA: Insufficient documentation

## 2016-01-11 DIAGNOSIS — Z923 Personal history of irradiation: Secondary | ICD-10-CM | POA: Insufficient documentation

## 2016-01-11 DIAGNOSIS — F17210 Nicotine dependence, cigarettes, uncomplicated: Secondary | ICD-10-CM | POA: Diagnosis not present

## 2016-01-11 DIAGNOSIS — R011 Cardiac murmur, unspecified: Secondary | ICD-10-CM | POA: Diagnosis not present

## 2016-01-11 DIAGNOSIS — Z510 Encounter for antineoplastic radiation therapy: Secondary | ICD-10-CM | POA: Diagnosis not present

## 2016-01-11 HISTORY — PX: TANDEM RING INSERTION: SHX6199

## 2016-01-11 SURGERY — INSERTION, UTERINE TANDEM AND RING OR CYLINDER, FOR BRACHYTHERAPY
Anesthesia: General | Site: Vagina

## 2016-01-11 MED ORDER — PHENYLEPHRINE 40 MCG/ML (10ML) SYRINGE FOR IV PUSH (FOR BLOOD PRESSURE SUPPORT)
PREFILLED_SYRINGE | INTRAVENOUS | Status: AC
Start: 2016-01-11 — End: 2016-01-11
  Filled 2016-01-11: qty 10

## 2016-01-11 MED ORDER — FENTANYL CITRATE (PF) 100 MCG/2ML IJ SOLN
25.0000 ug | INTRAMUSCULAR | Status: DC | PRN
Start: 2016-01-11 — End: 2016-01-11
  Filled 2016-01-11: qty 1

## 2016-01-11 MED ORDER — ONDANSETRON HCL 4 MG/2ML IJ SOLN
INTRAMUSCULAR | Status: AC
  Filled 2016-01-11: qty 2

## 2016-01-11 MED ORDER — SODIUM CHLORIDE 0.9 % IV SOLN
INTRAVENOUS | Status: DC
  Filled 2016-01-11: qty 1000

## 2016-01-11 MED ORDER — LIDOCAINE HCL (CARDIAC) 20 MG/ML IV SOLN
INTRAVENOUS | Status: AC
  Filled 2016-01-11: qty 5

## 2016-01-11 MED ORDER — FENTANYL CITRATE (PF) 100 MCG/2ML IJ SOLN
INTRAMUSCULAR | Status: AC
  Filled 2016-01-11: qty 4

## 2016-01-11 MED ORDER — ONDANSETRON HCL 40 MG/20ML IJ SOLN
Freq: Once | INTRAMUSCULAR | Status: DC
  Filled 2016-01-11: qty 4

## 2016-01-11 MED ORDER — KETOROLAC TROMETHAMINE 30 MG/ML IJ SOLN
INTRAMUSCULAR | Status: AC
  Filled 2016-01-11: qty 1

## 2016-01-11 MED ORDER — HYDROMORPHONE HCL 1 MG/ML IJ SOLN
0.5000 mg | Freq: Once | INTRAMUSCULAR | Status: AC
  Administered 2016-01-11: 0.5 mg via INTRAVENOUS
  Filled 2016-01-11: qty 1

## 2016-01-11 MED ORDER — DEXAMETHASONE SODIUM PHOSPHATE 10 MG/ML IJ SOLN
INTRAMUSCULAR | Status: AC
  Filled 2016-01-11: qty 1

## 2016-01-11 MED ORDER — ONDANSETRON HCL 4 MG/2ML IJ SOLN
INTRAMUSCULAR | Status: DC | PRN
  Administered 2016-01-11: 4 mg via INTRAVENOUS

## 2016-01-11 MED ORDER — FENTANYL CITRATE (PF) 100 MCG/2ML IJ SOLN
INTRAMUSCULAR | Status: DC | PRN
  Administered 2016-01-11: 50 ug via INTRAVENOUS

## 2016-01-11 MED ORDER — LIDOCAINE HCL (CARDIAC) 20 MG/ML IV SOLN
INTRAVENOUS | Status: DC | PRN
  Administered 2016-01-11: 50 mg via INTRAVENOUS

## 2016-01-11 MED ORDER — PROPOFOL 10 MG/ML IV BOLUS
INTRAVENOUS | Status: AC
  Filled 2016-01-11: qty 40

## 2016-01-11 MED ORDER — MIDAZOLAM HCL 5 MG/5ML IJ SOLN
INTRAMUSCULAR | Status: DC | PRN
  Administered 2016-01-11: 2 mg via INTRAVENOUS

## 2016-01-11 MED ORDER — MIDAZOLAM HCL 2 MG/2ML IJ SOLN
INTRAMUSCULAR | Status: AC
  Filled 2016-01-11: qty 2

## 2016-01-11 MED ORDER — DEXAMETHASONE SODIUM PHOSPHATE 4 MG/ML IJ SOLN
INTRAMUSCULAR | Status: DC | PRN
  Administered 2016-01-11: 10 mg via INTRAVENOUS

## 2016-01-11 MED ORDER — PROMETHAZINE HCL 25 MG/ML IJ SOLN
6.2500 mg | INTRAMUSCULAR | Status: DC | PRN
  Filled 2016-01-11: qty 1

## 2016-01-11 MED ORDER — LACTATED RINGERS IV SOLN
INTRAVENOUS | Status: DC
Start: 2016-01-11 — End: 2016-01-11
  Administered 2016-01-11 (×2): via INTRAVENOUS
  Filled 2016-01-11: qty 1000

## 2016-01-11 MED ORDER — PROPOFOL 10 MG/ML IV BOLUS
INTRAVENOUS | Status: DC | PRN
  Administered 2016-01-11: 130 mg via INTRAVENOUS

## 2016-01-11 MED ORDER — HYDROCODONE-ACETAMINOPHEN 7.5-325 MG PO TABS
1.0000 | ORAL_TABLET | Freq: Once | ORAL | Status: DC | PRN
  Filled 2016-01-11: qty 1

## 2016-01-11 MED ORDER — PHENYLEPHRINE HCL 10 MG/ML IJ SOLN
INTRAMUSCULAR | Status: DC | PRN
  Administered 2016-01-11: 40 ug via INTRAVENOUS
  Administered 2016-01-11: 80 ug via INTRAVENOUS

## 2016-01-11 SURGICAL SUPPLY — 40 items
BAG URINE DRAINAGE (UROLOGICAL SUPPLIES) ×3 IMPLANT
BNDG CONFORM 2 STRL LF (GAUZE/BANDAGES/DRESSINGS) IMPLANT
CATH FOLEY 2WAY SLVR  5CC 16FR (CATHETERS) ×2
CATH FOLEY 2WAY SLVR 5CC 16FR (CATHETERS) ×1 IMPLANT
COVER BACK TABLE 60X90IN (DRAPES) IMPLANT
DRAPE LG THREE QUARTER DISP (DRAPES) ×3 IMPLANT
DRAPE UNDERBUTTOCKS STRL (DRAPE) ×3 IMPLANT
DRSG PAD ABDOMINAL 8X10 ST (GAUZE/BANDAGES/DRESSINGS) ×3 IMPLANT
GLOVE BIO SURGEON STRL SZ 6.5 (GLOVE) ×2 IMPLANT
GLOVE BIO SURGEON STRL SZ7.5 (GLOVE) ×6 IMPLANT
GLOVE BIO SURGEONS STRL SZ 6.5 (GLOVE) ×1
GLOVE INDICATOR 6.5 STRL GRN (GLOVE) ×6 IMPLANT
GLOVE INDICATOR 7.5 STRL GRN (GLOVE) ×6 IMPLANT
GLOVE SURG SS PI 7.5 STRL IVOR (GLOVE) ×3 IMPLANT
GOWN STRL REUS W/ TWL LRG LVL3 (GOWN DISPOSABLE) ×2 IMPLANT
GOWN STRL REUS W/TWL LRG LVL3 (GOWN DISPOSABLE) ×4
HOLDER FOLEY CATH W/STRAP (MISCELLANEOUS) IMPLANT
KIT ROOM TURNOVER WOR (KITS) ×3 IMPLANT
LEGGING LITHOTOMY PAIR STRL (DRAPES) ×3 IMPLANT
MANIFOLD NEPTUNE II (INSTRUMENTS) IMPLANT
NEEDLE SPNL 22GX3.5 QUINCKE BK (NEEDLE) IMPLANT
PACK BASIN DAY SURGERY FS (CUSTOM PROCEDURE TRAY) ×3 IMPLANT
PACKING VAGINAL (PACKING) IMPLANT
PAD ABD 8X10 STRL (GAUZE/BANDAGES/DRESSINGS) ×3 IMPLANT
PAD OB MATERNITY 4.3X12.25 (PERSONAL CARE ITEMS) IMPLANT
PAD PREP 24X48 CUFFED NSTRL (MISCELLANEOUS) ×3 IMPLANT
PLUG CATH AND CAP STER (CATHETERS) IMPLANT
SET IRRIG Y TYPE TUR BLADDER L (SET/KITS/TRAYS/PACK) ×3 IMPLANT
SUT PROLENE 0 SH 30 (SUTURE) IMPLANT
SUT SILK 2 0 30  PSL (SUTURE)
SUT SILK 2 0 30 PSL (SUTURE) IMPLANT
SYR BULB IRRIGATION 50ML (SYRINGE) IMPLANT
SYR CONTROL 10ML LL (SYRINGE) IMPLANT
SYRINGE 10CC LL (SYRINGE) ×3 IMPLANT
TOWEL OR 17X24 6PK STRL BLUE (TOWEL DISPOSABLE) ×6 IMPLANT
TRAY DSU PREP LF (CUSTOM PROCEDURE TRAY) ×3 IMPLANT
TUBE CONNECTING 12'X1/4 (SUCTIONS)
TUBE CONNECTING 12X1/4 (SUCTIONS) IMPLANT
WATER STERILE IRR 3000ML UROMA (IV SOLUTION) ×3 IMPLANT
WATER STERILE IRR 500ML POUR (IV SOLUTION) ×3 IMPLANT

## 2016-01-11 NOTE — Progress Notes (Signed)
  Radiation Oncology         [phone] ________________________________  Name: Valerie Figueroa MRN: [id number]  Date: 01/11/2016  DOB: Apr 26, 1956  SIMULATION AND TREATMENT PLANNING NOTE HDR BRACHYTHERAPY  DIAGNOSIS: Stage IIIB cervical cancer  NARRATIVE:  The patient was brought to the [place].  Identity was confirmed.  All relevant records and images related to the planned course of therapy were reviewed.  The patient freely provided informed written consent to proceed with treatment after reviewing the details related to the planned course of therapy. The consent form was witnessed and verified by the simulation staff.  Then, the patient was set-up in a stable reproducible  supine position for radiation therapy.  CT images were obtained.  Surface markings were placed.  The CT images were loaded into the planning software.  Then the target and avoidance structures were contoured.  Treatment planning then occurred.  The radiation prescription was entered and confirmed.   I have requested : Brachytherapy Isodose Plan and Dosimetry Calculations to plan the radiation distribution.    PLAN:  The patient will receive 5.5 Gy in 1 fraction.  The patient will be treated with iridium 192 as the high-dose-rate source using the tandem ring system.  ________________________________  Blair Promise, PhD, MD  This document serves as a record of services personally performed by Gery Pray, MD. It was created on his behalf by Darcus Austin, a trained medical scribe. The creation of this record is based on the scribe's personal observations and the provider's statements to them. This document has been checked and approved by the attending provider.

## 2016-01-11 NOTE — Anesthesia Postprocedure Evaluation (Signed)
Anesthesia Post Note  Patient: Valerie Figueroa  Procedure(s) Performed: Procedure(s) (LRB): TANDEM RING INSERTION (N/A)  Patient location during evaluation: PACU Anesthesia Type: General Level of consciousness: awake and alert Pain management: pain level controlled Vital Signs Assessment: post-procedure vital signs reviewed and stable Respiratory status: spontaneous breathing Cardiovascular status: blood pressure returned to baseline Anesthetic complications: no    Last Vitals:  Filed Vitals:   01/11/16 0830 01/11/16 0845  BP: 118/63 118/63  Pulse: 71 74  Temp:    Resp: 20 15    Last Pain:  Filed Vitals:   01/11/16 0853  PainSc: 1                  Tiajuana Amass

## 2016-01-11 NOTE — Progress Notes (Signed)
Patient reports pain at a 3/10 now.  Will continue to monitor.

## 2016-01-11 NOTE — Anesthesia Preprocedure Evaluation (Addendum)
Anesthesia Evaluation  Patient identified by MRN, date of birth, ID band Patient awake    Reviewed: Allergy & Precautions, NPO status , Patient's Chart, lab work & pertinent test results  History of Anesthesia Complications Negative for: history of anesthetic complications  Airway Mallampati: III  TM Distance: >3 FB Neck ROM: Full    Dental  (+) Partial Upper,    Pulmonary neg shortness of breath, neg sleep apnea, neg COPD, neg recent URI, Current Smoker,    breath sounds clear to auscultation       Cardiovascular Exercise Tolerance: Good negative cardio ROS   Rhythm:Regular     Neuro/Psych negative neurological ROS  negative psych ROS   GI/Hepatic negative GI ROS, Neg liver ROS,   Endo/Other  negative endocrine ROS  Renal/GU negative Renal ROS   CX CA    Musculoskeletal negative musculoskeletal ROS (+)   Abdominal   Peds  Hematology  (+) anemia ,   Anesthesia Other Findings   Reproductive/Obstetrics                            Lab Results  Component Value Date   WBC 6.4 01/08/2016   HGB 9.9* 01/08/2016   HCT 28.8* 01/08/2016   MCV 99.2 01/08/2016   PLT 129* 01/08/2016   Lab Results  Component Value Date   CREATININE 0.8 01/08/2016   BUN 15.2 01/08/2016   NA 137 01/08/2016   K 4.3 01/08/2016   CL 106 01/04/2016   CO2 20* 01/08/2016    Anesthesia Physical  Anesthesia Plan  ASA: II  Anesthesia Plan: General   Post-op Pain Management:    Induction: Intravenous  Airway Management Planned: LMA  Additional Equipment: None  Intra-op Plan:   Post-operative Plan: Extubation in OR  Informed Consent: I have reviewed the patients History and Physical, chart, labs and discussed the procedure including the risks, benefits and alternatives for the proposed anesthesia with the patient or authorized representative who has indicated his/her understanding and acceptance.    Dental advisory given  Plan Discussed with: CRNA and Surgeon  Anesthesia Plan Comments: (LMA 4 last treatment)        Anesthesia Quick Evaluation

## 2016-01-11 NOTE — Anesthesia Procedure Notes (Signed)
Procedure Name: LMA Insertion Date/Time: 01/11/2016 7:34 AM Performed by: Wanita Chamberlain Pre-anesthesia Checklist: Patient identified, Timeout performed, Emergency Drugs available, Suction available and Patient being monitored Patient Re-evaluated:Patient Re-evaluated prior to inductionOxygen Delivery Method: Circle system utilized Preoxygenation: Pre-oxygenation with 100% oxygen Intubation Type: IV induction Ventilation: Mask ventilation without difficulty LMA: LMA inserted LMA Size: 4.0 Number of attempts: 1 Placement Confirmation: breath sounds checked- equal and bilateral and positive ETCO2 Tube secured with: Tape Dental Injury: Teeth and Oropharynx as per pre-operative assessment

## 2016-01-11 NOTE — Op Note (Signed)
01/11/2016  8:24 AM  PATIENT:  [name]  60 y.o. female  PRE-OPERATIVE DIAGNOSIS:  cervical cancer  POST-OPERATIVE DIAGNOSIS:  cervical cancer  PROCEDURE:  Procedure(s): TANDEM RING INSERTION (N/A)  SURGEON:  Surgeon(s) and Role:    * Gery Pray, MD - Primary  PHYSICIAN ASSISTANT:   ASSISTANTS: none   ANESTHESIA:   general  EBL:  Total I/O In: 400 [I.V.:400] Out: 75 [Urine:75]  BLOOD ADMINISTERED:none  DRAINS: Urinary Catheter (Foley)   LOCAL MEDICATIONS USED:  NONE  SPECIMEN:  No Specimen  DISPOSITION OF SPECIMEN:  N/A  COUNTS:  YES  TOURNIQUET:  * No tourniquets in log *  DICTATION: Patient was taken to the operating room. Timeout was performed. The patient was prepped and draped in the usual sterile fashion and placed in the dorsolithotomy position. Ultrasound was on standby but given the good position of the cervical sleeve this was not required for the procedure. On inspection the cervical sleeve placed  by Dr. Denman George was noted to be in good position. The patient then had a 60 mm, 60 tandem placed within the uterine canal and within the cervical sleeve.. A 60 ring with minimal shielding was placed along the cervical area. A rectal paddle was also placed. The patient tolerated the procedure well  PLAN OF CARE: Transferred to radiation oncology for planning and treatment  PATIENT DISPOSITION:  Transferred to radiation oncology for planning and treatment   Delay start of Pharmacological VTE agent (>24hrs) due to surgical blood loss or risk of bleeding: not applicable

## 2016-01-11 NOTE — Progress Notes (Signed)
IMMEDIATELY FOLLOWING SURGERY: Do not drive or operate machinery for the first twenty four hours after surgery. Do not make any important decisions for twenty four hours after surgery or while taking narcotic pain medications or sedatives. If you develop intractable nausea and vomiting or a severe headache please notify your doctor immediately.   FOLLOW-UP: You do not need to follow up with anesthesia unless specifically instructed to do so.   WOUND CARE INSTRUCTIONS (if applicable): Expect some mild vaginal bleeding, but if large amount of bleeding occurs please contact Dr. Sondra Come at [phone] or the Radiation On-Call physician. Call for any fever greater than 101.0 degrees or increasing vaginal//abdominal pain or trouble urinating.   QUESTIONS?: Please feel free to call your physician or the hospital operator if you have any questions, and they will be happy to assist you. Resume all medications: as listed on your after visit summary. Your next appointment is:  Future Appointments Date Time Provider [place]  01/11/2016 2:00 PM Gery Pray, MD [facility] None  01/17/2016 8:00 AM WL-US 1 WL-US [place]  01/17/2016 11:00 AM Gery Pray, MD [facility] None  01/17/2016 3:00 PM Gery Pray, MD [facility] None  01/25/2016 7:00 AM WL-US 1 WL-US [place]  01/25/2016 10:00 AM Gery Pray, MD [facility] None  01/25/2016 2:00 PM Gery Pray, MD [facility] None  02/28/2016 10:00 AM [facility] LAB 3 [facility] None  02/28/2016 10:45 AM Nancy Marus, MD [facility] None  03/11/2016 9:10 AM GI-BCG DIAG [place] 2 GI-BCGMM GI-BREAST CE  03/11/2016 9:20 AM GI-BCG [place] 1 GI-BCGUS GI-BREAST CE  04/01/2016 8:00 AM [facility] LAB 3 [facility] None  04/01/2016 8:30 AM Lennis Marion Downer, MD [facility] None

## 2016-01-11 NOTE — Progress Notes (Signed)
  Radiation Oncology         [phone] ________________________________  Name: Valerie Figueroa MRN: [id number]  Date: 01/11/2016  DOB: 08-17-1956  CC: [name], MD  Nancy Marus, MD  HDR BRACHYTHERAPY NOTE  DIAGNOSIS: Stage IIIB cervical cancer  NARRATIVE:  Simple treatment device note:  While in the operating room the patient had construction of her custom tandem/ring system. She will be treated with a 60 mm, 60 tandem and 60 ring/paddle system  The patient was brought to the HDR suite. Identity was confirmed. All relevant records and images related to the planned course of therapy were reviewed. The patient freely provided informed written consent to proceed with treatment after reviewing the details related to the planned course of therapy. The consent form was witnessed and verified by the simulation staff. Then, the patient was set-up in a stable reproducible supine position for radiation therapy. The tandem ring system was accessed and fiducial markers were placed within the tandem and ring.   Verification simulation note: An AP and lateral film was obtained through the pelvis area. This was compared to the patient's planning films documenting accurate position of the tandem/ring system for treatment.  High-dose-rate brachytherapy treatment note:  The remote afterloading device was accessed through catheter system and attached to the tandem ring system. Patient then proceeded to undergo her 3rd high-dose-rate treatment directed at the cervix. The patient was prescribed a dose of 5.5 gray to be delivered to the [name]. Patient was treated with 2 channels using multiple dwell positions. Treatment time was 339.9 seconds. The patient tolerated the procedure well. After completion of her therapy, a radiation survey was performed documenting return of the iridium source into the [employer] safe. The patient was then transferred to the nursing suite.  She then had removal of  the rectal paddle followed by the tandem and ring system.  the patient tolerated the removal well.  PLAN: Patient will return next week for her fourth high-dose-rate treatment ________________________________  Blair Promise, PhD, MD  This document serves as a record of services personally performed by Gery Pray, MD. It was created on his behalf by Darcus Austin, a trained medical scribe. The creation of this record is based on the scribe's personal observations and the provider's statements to them. This document has been checked and approved by the attending provider.

## 2016-01-11 NOTE — Transfer of Care (Signed)
Immediate Anesthesia Transfer of Care Note  Patient: Valerie Figueroa  Procedure(s) Performed: Procedure(s): TANDEM RING INSERTION (N/A)  Patient Location: PACU  Anesthesia Type:General  Level of Consciousness: awake, alert , oriented and patient cooperative  Airway & Oxygen Therapy: Patient Spontanous Breathing and Patient connected to nasal cannula oxygen  Post-op Assessment: Report given to RN and Post -op Vital signs reviewed and stable  Post vital signs: Reviewed and stable  Last Vitals:  Filed Vitals:   01/11/16 0616  BP: 169/73  Pulse: 97  Temp: 36.6 C  Resp: 16    Complications: No apparent anesthesia complications

## 2016-01-11 NOTE — Progress Notes (Signed)
Received report from [facility] in PACU.  Patient has a #20 gauge IV in her left hand with LR infusing to gravity.  She has a foley catheter draining clear yellow urine.  She reports having slight pain in her vaginal area.  Transported patient to CT SIM via stretcher with Nicki Reaper, Nurse Transporter.

## 2016-01-11 NOTE — Interval H&P Note (Signed)
History and Physical Interval Note:  01/11/2016 7:36 AM  Valerie Figueroa  has presented today for surgery, with the diagnosis of cervical cancer  The various methods of treatment have been discussed with the patient and family. After consideration of risks, benefits and other options for treatment, the patient has consented to  Procedure(s): TANDEM RING INSERTION (N/A) as a surgical intervention .  The patient's history has been reviewed, patient examined, no change in status, stable for surgery.  I have reviewed the patient's chart and labs.  Questions were answered to the patient's satisfaction.     Gery Pray

## 2016-01-11 NOTE — H&P (View-Only) (Signed)
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 female who is   currently undergoing definitive radiation and radiosensitizing chemotherapy. She now ready to undergo her first brachytherapy procedure. She is tolerating her treatment well.     PAST MEDICAL HISTORY:  has a past medical history of Anemia; Cervical cancer ([place]); Radiation (08/31/15-09/04/15); and Heart murmur.    PAST SURGICAL HISTORY: Past Surgical History  Procedure Laterality Date  . Ovarian cyst removal Left 01-17-1985  . Carpal tunnel release Bilateral   . Tubal ligation      FAMILY HISTORY: family history includes Cancer in her father.  SOCIAL HISTORY:  reports that she has been smoking Cigarettes.  She has a 15 pack-year smoking history. She has never used smokeless tobacco. She reports that she does not drink alcohol or use illicit drugs.  ALLERGIES: Paclitaxel  MEDICATIONS:  No current facility-administered medications for this encounter.   Current Outpatient Prescriptions  Medication Sig Dispense Refill  . acetaminophen (TYLENOL) 500 MG tablet Take 1,000 mg by mouth every 8 (eight) hours as needed for mild pain.     Marland Kitchen albuterol (PROVENTIL HFA;VENTOLIN HFA) 108 (90 BASE) MCG/ACT inhaler Inhale 2 puffs into the lungs every 6 (six) hours as needed for wheezing or shortness of breath. 1 Inhaler 2  . Fe Fum-FA-B Cmp-C-Zn-Mg-Mn-Cu (CENTRATEX) 106-1 MG CAPS Take 1 capsule by mouth daily.    Marland Kitchen loperamide (IMODIUM) 2 MG capsule Take 2 mg by mouth daily as needed for diarrhea or loose stools.     Marland Kitchen LORazepam (ATIVAN) 0.5 MG tablet Place 1 tablet under the tongue or swallow every 6 hours as needed for nausea. Will make drowsy. 20 tablet 0  .  Multiple Vitamins-Minerals (CENTRUM ULTRA WOMENS) TABS Take 1 tablet by mouth every morning.    . ondansetron (ZOFRAN) 8 MG tablet Take 1 tablet (8 mg total) by mouth every 8 (eight) hours as needed for nausea or vomiting. 30 tablet 1  . polyethylene glycol (MIRALAX / GLYCOLAX) packet Take 17 g by mouth daily as needed (constipation). Reported on 12/19/2015    . traMADol (ULTRAM) 50 MG tablet Take 1 tablet (50 mg total) by mouth every 8 (eight) hours as needed. (Patient not taking: Reported on 12/19/2015) 20 tablet 0  . loratadine (CLARITIN) 10 MG tablet Take 10 mg by mouth daily. [employer]      REVIEW OF SYSTEMS:  A 15 point review of systems is documented in the electronic medical record. This was obtained by the nursing staff. However, I reviewed this with the patient to discuss relevant findings and make appropriate changes.  Some fatigue, no pelvic pain or vaginal bleeding   PHYSICAL EXAM: BP 127/76 mmHg  Pulse 85  Temp(Src) 98.3 F (36.8 C)  Ht '5\' 4"'$  (1.626 m)  Wt 144 lb 9.6 oz (65.59 kg)  BMI 24.81 kg/m2   Well-nourished, well-developed female in no acute distress. Neck: Supple, no lymphadenopathy no thyromegaly. No palpable cervical or supraclavicular adenopathy. Mouth: Partial on top; few teeth missing on bottom. Lungs: Clear to auscultation. Cardiac: Regular rate and rhythm. Abdomen: . Abdomen is soft, nontender, nondistended. There are no palpable masses or hepatosplenomegaly. Groins: No lymphadenopathy. Extremities:  No peripheral edema. Pulses are good. Skin: Birth mark from right flank to right lower back area. 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     ------------------------------------------------  Blair Promise, PhD, MD

## 2016-01-11 NOTE — Progress Notes (Signed)
Patient reporting pain at a 8/10 in her vaginal area.  She was given 0.5 mg dilaudid IV per Dr. Sondra Come.  Cal light in reach.  Will continue to monitor.

## 2016-01-11 NOTE — Progress Notes (Signed)
Foley catheter and IV removed intact by Harlow Asa, RN.  Patient was given discharge paperwork and instructions.  She ambulated out of the clinic with her daughter in law.

## 2016-01-12 ENCOUNTER — Encounter (HOSPITAL_BASED_OUTPATIENT_CLINIC_OR_DEPARTMENT_OTHER): Payer: Self-pay | Admitting: Radiation Oncology

## 2016-01-12 NOTE — Progress Notes (Signed)
NPO AFTER MN.  ARRIVE AT 0700.  CURRENT LAB RESULTS IN CHART AND EPIC.

## 2016-01-17 ENCOUNTER — Ambulatory Visit
Admit: 2016-01-17 | Discharge: 2016-01-17 | Disposition: A | Discharge status: Home / Self Care | Payer: BLUE CROSS/BLUE SHIELD | Attending: Radiation Oncology | Admitting: Radiation Oncology

## 2016-01-17 ENCOUNTER — Encounter (HOSPITAL_BASED_OUTPATIENT_CLINIC_OR_DEPARTMENT_OTHER)
Admission: RE | Disposition: A | Discharge status: Other Acute Inpatient Hospital | Payer: Self-pay | Source: Ambulatory Visit | Attending: Radiation Oncology

## 2016-01-17 ENCOUNTER — Ambulatory Visit (HOSPITAL_BASED_OUTPATIENT_CLINIC_OR_DEPARTMENT_OTHER)
Admission: RE | Admit: 2016-01-17 | Discharge: 2016-01-17 | Disposition: A | Discharge status: Other Acute Inpatient Hospital | Payer: BLUE CROSS/BLUE SHIELD | Source: Ambulatory Visit | Attending: Radiation Oncology | Admitting: Radiation Oncology

## 2016-01-17 ENCOUNTER — Encounter (HOSPITAL_BASED_OUTPATIENT_CLINIC_OR_DEPARTMENT_OTHER): Payer: Self-pay | Admitting: Anesthesiology

## 2016-01-17 ENCOUNTER — Ambulatory Visit (HOSPITAL_BASED_OUTPATIENT_CLINIC_OR_DEPARTMENT_OTHER): Payer: BLUE CROSS/BLUE SHIELD | Admitting: Anesthesiology

## 2016-01-17 ENCOUNTER — Ambulatory Visit (HOSPITAL_COMMUNITY)
Admission: RE | Admit: 2016-01-17 | Discharge: 2016-01-17 | Disposition: A | Discharge status: Home / Self Care | Payer: BLUE CROSS/BLUE SHIELD | Source: Ambulatory Visit | Attending: Radiation Oncology | Admitting: Radiation Oncology

## 2016-01-17 DIAGNOSIS — Z510 Encounter for antineoplastic radiation therapy: Secondary | ICD-10-CM | POA: Diagnosis not present

## 2016-01-17 DIAGNOSIS — F17210 Nicotine dependence, cigarettes, uncomplicated: Secondary | ICD-10-CM | POA: Insufficient documentation

## 2016-01-17 DIAGNOSIS — C539 Malignant neoplasm of cervix uteri, unspecified: Secondary | ICD-10-CM

## 2016-01-17 DIAGNOSIS — R011 Cardiac murmur, unspecified: Secondary | ICD-10-CM | POA: Insufficient documentation

## 2016-01-17 DIAGNOSIS — Z923 Personal history of irradiation: Secondary | ICD-10-CM | POA: Insufficient documentation

## 2016-01-17 DIAGNOSIS — Z79899 Other long term (current) drug therapy: Secondary | ICD-10-CM | POA: Diagnosis not present

## 2016-01-17 HISTORY — PX: TANDEM RING INSERTION: SHX6199

## 2016-01-17 SURGERY — INSERTION, UTERINE TANDEM AND RING OR CYLINDER, FOR BRACHYTHERAPY
Anesthesia: General | Site: Vagina

## 2016-01-17 MED ORDER — FENTANYL CITRATE (PF) 100 MCG/2ML IJ SOLN
INTRAMUSCULAR | Status: AC
  Filled 2016-01-17: qty 2

## 2016-01-17 MED ORDER — MIDAZOLAM HCL 5 MG/5ML IJ SOLN
INTRAMUSCULAR | Status: DC | PRN
  Administered 2016-01-17: 2 mg via INTRAVENOUS

## 2016-01-17 MED ORDER — LIDOCAINE HCL (CARDIAC) 20 MG/ML IV SOLN
INTRAVENOUS | Status: DC | PRN
  Administered 2016-01-17: 50 mg via INTRAVENOUS

## 2016-01-17 MED ORDER — FENTANYL CITRATE (PF) 100 MCG/2ML IJ SOLN
INTRAMUSCULAR | Status: DC | PRN
  Administered 2016-01-17: 50 ug via INTRAVENOUS
  Administered 2016-01-17 (×2): 25 ug via INTRAVENOUS

## 2016-01-17 MED ORDER — ACETAMINOPHEN 500 MG PO TABS
ORAL_TABLET | ORAL | Status: AC
  Filled 2016-01-17: qty 2

## 2016-01-17 MED ORDER — DEXAMETHASONE SODIUM PHOSPHATE 10 MG/ML IJ SOLN
INTRAMUSCULAR | Status: AC
  Filled 2016-01-17: qty 1

## 2016-01-17 MED ORDER — LIDOCAINE HCL (CARDIAC) 20 MG/ML IV SOLN
INTRAVENOUS | Status: AC
  Filled 2016-01-17: qty 5

## 2016-01-17 MED ORDER — DEXAMETHASONE SODIUM PHOSPHATE 4 MG/ML IJ SOLN
INTRAMUSCULAR | Status: DC | PRN
  Administered 2016-01-17: 10 mg via INTRAVENOUS

## 2016-01-17 MED ORDER — PROPOFOL 10 MG/ML IV BOLUS
INTRAVENOUS | Status: DC | PRN
  Administered 2016-01-17: 50 mg via INTRAVENOUS
  Administered 2016-01-17: 30 mg via INTRAVENOUS

## 2016-01-17 MED ORDER — WATER FOR IRRIGATION, STERILE IR SOLN
Status: DC | PRN
  Administered 2016-01-17: 500 mL

## 2016-01-17 MED ORDER — KETOROLAC TROMETHAMINE 30 MG/ML IJ SOLN
INTRAMUSCULAR | Status: DC | PRN
  Administered 2016-01-17: 30 mg via INTRAVENOUS

## 2016-01-17 MED ORDER — LACTATED RINGERS IV SOLN
INTRAVENOUS | Status: DC
  Administered 2016-01-17: 08:00:00 via INTRAVENOUS
  Filled 2016-01-17: qty 1000

## 2016-01-17 MED ORDER — MIDAZOLAM HCL 2 MG/2ML IJ SOLN
INTRAMUSCULAR | Status: AC
  Filled 2016-01-17: qty 2

## 2016-01-17 MED ORDER — PROMETHAZINE HCL 25 MG/ML IJ SOLN
6.2500 mg | INTRAMUSCULAR | Status: DC | PRN
  Filled 2016-01-17: qty 1

## 2016-01-17 MED ORDER — HYDROMORPHONE HCL 1 MG/ML IJ SOLN
0.5000 mg | Freq: Once | INTRAMUSCULAR | Status: AC
  Administered 2016-01-17: 0.5 mg via INTRAVENOUS
  Filled 2016-01-17: qty 1

## 2016-01-17 MED ORDER — ONDANSETRON HCL 4 MG/2ML IJ SOLN
INTRAMUSCULAR | Status: AC
  Filled 2016-01-17: qty 2

## 2016-01-17 MED ORDER — LACTATED RINGERS IV SOLN
INTRAVENOUS | Status: DC
  Administered 2016-01-17: 11:00:00 via INTRAVENOUS
  Filled 2016-01-17: qty 250

## 2016-01-17 MED ORDER — PROPOFOL 10 MG/ML IV BOLUS
INTRAVENOUS | Status: AC
  Filled 2016-01-17: qty 40

## 2016-01-17 MED ORDER — FENTANYL CITRATE (PF) 100 MCG/2ML IJ SOLN
25.0000 ug | INTRAMUSCULAR | Status: DC | PRN
  Filled 2016-01-17: qty 1

## 2016-01-17 MED ORDER — ONDANSETRON HCL 4 MG/2ML IJ SOLN
INTRAMUSCULAR | Status: DC | PRN
  Administered 2016-01-17: 4 mg via INTRAVENOUS

## 2016-01-17 SURGICAL SUPPLY — 36 items
BAG URINE DRAINAGE (UROLOGICAL SUPPLIES) ×2 IMPLANT
BNDG CONFORM 2 STRL LF (GAUZE/BANDAGES/DRESSINGS) IMPLANT
CATH FOLEY 2WAY SLVR  5CC 16FR (CATHETERS) ×1
CATH FOLEY 2WAY SLVR 5CC 16FR (CATHETERS) ×1 IMPLANT
COVER BACK TABLE 60X90IN (DRAPES) ×2 IMPLANT
DRAPE LG THREE QUARTER DISP (DRAPES) ×2 IMPLANT
DRAPE UNDERBUTTOCKS STRL (DRAPE) ×2 IMPLANT
DRSG PAD ABDOMINAL 8X10 ST (GAUZE/BANDAGES/DRESSINGS) ×2 IMPLANT
GLOVE BIO SURGEON STRL SZ 6.5 (GLOVE) ×2 IMPLANT
GLOVE BIO SURGEON STRL SZ7.5 (GLOVE) ×4 IMPLANT
GLOVE INDICATOR 6.5 STRL GRN (GLOVE) ×2 IMPLANT
GLOVE INDICATOR 7.5 STRL GRN (GLOVE) ×2 IMPLANT
GOWN STRL REUS W/ TWL LRG LVL3 (GOWN DISPOSABLE) ×2 IMPLANT
GOWN STRL REUS W/TWL LRG LVL3 (GOWN DISPOSABLE) ×2
HOLDER FOLEY CATH W/STRAP (MISCELLANEOUS) ×2 IMPLANT
KIT ROOM TURNOVER WOR (KITS) ×2 IMPLANT
LEGGING LITHOTOMY PAIR STRL (DRAPES) ×2 IMPLANT
NEEDLE SPNL 22GX3.5 QUINCKE BK (NEEDLE) IMPLANT
PACK BASIN DAY SURGERY FS (CUSTOM PROCEDURE TRAY) ×2 IMPLANT
PACKING VAGINAL (PACKING) IMPLANT
PAD ABD 8X10 STRL (GAUZE/BANDAGES/DRESSINGS) ×2 IMPLANT
PAD OB MATERNITY 4.3X12.25 (PERSONAL CARE ITEMS) ×2 IMPLANT
PAD PREP 24X48 CUFFED NSTRL (MISCELLANEOUS) ×2 IMPLANT
PLUG CATH AND CAP STER (CATHETERS) IMPLANT
SET IRRIG Y TYPE TUR BLADDER L (SET/KITS/TRAYS/PACK) IMPLANT
SUT PROLENE 0 SH 30 (SUTURE) IMPLANT
SUT SILK 2 0 30  PSL (SUTURE)
SUT SILK 2 0 30 PSL (SUTURE) IMPLANT
SYR BULB IRRIGATION 50ML (SYRINGE) IMPLANT
SYR CONTROL 10ML LL (SYRINGE) IMPLANT
SYRINGE 10CC LL (SYRINGE) ×2 IMPLANT
TOWEL OR 17X24 6PK STRL BLUE (TOWEL DISPOSABLE) ×4 IMPLANT
TRAY DSU PREP LF (CUSTOM PROCEDURE TRAY) ×2 IMPLANT
TUBE CONNECTING 12X1/4 (SUCTIONS) IMPLANT
WATER STERILE IRR 3000ML UROMA (IV SOLUTION) IMPLANT
WATER STERILE IRR 500ML POUR (IV SOLUTION) ×2 IMPLANT

## 2016-01-17 NOTE — Anesthesia Postprocedure Evaluation (Signed)
Anesthesia Post Note  Patient: Valerie Figueroa  Procedure(s) Performed: Procedure(s) (LRB): TANDEM RING INSERTION (N/A)  Patient location during evaluation: PACU Anesthesia Type: General Level of consciousness: awake and alert Pain management: pain level controlled Vital Signs Assessment: post-procedure vital signs reviewed and stable Respiratory status: spontaneous breathing, nonlabored ventilation, respiratory function stable and patient connected to nasal cannula oxygen Cardiovascular status: blood pressure returned to baseline and stable Postop Assessment: no signs of nausea or vomiting Anesthetic complications: no    Last Vitals:  Filed Vitals:   01/17/16 0915 01/17/16 0930  BP: 111/68 127/68  Pulse: 77 76  Temp:    Resp: 19 17    Last Pain:  Filed Vitals:   01/17/16 0935  PainSc: 0-No pain                 Sonji Starkes J

## 2016-01-17 NOTE — Anesthesia Preprocedure Evaluation (Addendum)
Anesthesia Evaluation  Patient identified by MRN, date of birth, ID band Patient awake    Reviewed: Allergy & Precautions, NPO status , Patient's Chart, lab work & pertinent test results  Airway Mallampati: II  TM Distance: >3 FB Neck ROM: Full    Dental no notable dental hx.    Pulmonary Current Smoker,    Pulmonary exam normal breath sounds clear to auscultation       Cardiovascular negative cardio ROS Normal cardiovascular exam+ Valvular Problems/Murmurs  Rhythm:Regular Rate:Normal     Neuro/Psych negative neurological ROS  negative psych ROS   GI/Hepatic negative GI ROS, Neg liver ROS,   Endo/Other  negative endocrine ROS  Renal/GU negative Renal ROS  negative genitourinary   Musculoskeletal negative musculoskeletal ROS (+)   Abdominal   Peds negative pediatric ROS (+)  Hematology  (+) anemia , hgb 9.9   Anesthesia Other Findings   Reproductive/Obstetrics negative OB ROS                             Anesthesia Physical Anesthesia Plan  ASA: II  Anesthesia Plan: General   Post-op Pain Management:    Induction: Intravenous  Airway Management Planned: LMA  Additional Equipment:   Intra-op Plan:   Post-operative Plan: Extubation in OR  Informed Consent: I have reviewed the patients History and Physical, chart, labs and discussed the procedure including the risks, benefits and alternatives for the proposed anesthesia with the patient or authorized representative who has indicated his/her understanding and acceptance.   Dental advisory given  Plan Discussed with: CRNA  Anesthesia Plan Comments:         Anesthesia Quick Evaluation

## 2016-01-17 NOTE — Progress Notes (Signed)
Received report from [place], RN in PACU.  Patient has an IV in her left hand with LR infusing to gravity.  She has a foley catheter draining clear yellow urine.  She denies having pain.  Transported to the Radiation Nursing area via stretcher.  Converted IV tubing to pump tubing.  LR now infusing at 125 ml/hr.  Call light in reach and family present in the room.

## 2016-01-17 NOTE — H&P (View-Only) (Signed)
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 female who is   currently undergoing definitive radiation and radiosensitizing chemotherapy. She now ready to undergo her first brachytherapy procedure. She is tolerating her treatment well.     PAST MEDICAL HISTORY:  has a past medical history of Anemia; Cervical cancer ([place]); Radiation (08/31/15-09/04/15); and Heart murmur.    PAST SURGICAL HISTORY: Past Surgical History  Procedure Laterality Date  . Ovarian cyst removal Left 01-17-1985  . Carpal tunnel release Bilateral   . Tubal ligation      FAMILY HISTORY: family history includes Cancer in her father.  SOCIAL HISTORY:  reports that she has been smoking Cigarettes.  She has a 15 pack-year smoking history. She has never used smokeless tobacco. She reports that she does not drink alcohol or use illicit drugs.  ALLERGIES: Paclitaxel  MEDICATIONS:  No current facility-administered medications for this encounter.   Current Outpatient Prescriptions  Medication Sig Dispense Refill  . acetaminophen (TYLENOL) 500 MG tablet Take 1,000 mg by mouth every 8 (eight) hours as needed for mild pain.     Marland Kitchen albuterol (PROVENTIL HFA;VENTOLIN HFA) 108 (90 BASE) MCG/ACT inhaler Inhale 2 puffs into the lungs every 6 (six) hours as needed for wheezing or shortness of breath. 1 Inhaler 2  . Fe Fum-FA-B Cmp-C-Zn-Mg-Mn-Cu (CENTRATEX) 106-1 MG CAPS Take 1 capsule by mouth daily.    Marland Kitchen loperamide (IMODIUM) 2 MG capsule Take 2 mg by mouth daily as needed for diarrhea or loose stools.     Marland Kitchen LORazepam (ATIVAN) 0.5 MG tablet Place 1 tablet under the tongue or swallow every 6 hours as needed for nausea. Will make drowsy. 20 tablet 0  .  Multiple Vitamins-Minerals (CENTRUM ULTRA WOMENS) TABS Take 1 tablet by mouth every morning.    . ondansetron (ZOFRAN) 8 MG tablet Take 1 tablet (8 mg total) by mouth every 8 (eight) hours as needed for nausea or vomiting. 30 tablet 1  . polyethylene glycol (MIRALAX / GLYCOLAX) packet Take 17 g by mouth daily as needed (constipation). Reported on 12/19/2015    . traMADol (ULTRAM) 50 MG tablet Take 1 tablet (50 mg total) by mouth every 8 (eight) hours as needed. (Patient not taking: Reported on 12/19/2015) 20 tablet 0  . loratadine (CLARITIN) 10 MG tablet Take 10 mg by mouth daily. [employer]      REVIEW OF SYSTEMS:  A 15 point review of systems is documented in the electronic medical record. This was obtained by the nursing staff. However, I reviewed this with the patient to discuss relevant findings and make appropriate changes.  Some fatigue, no pelvic pain or vaginal bleeding   PHYSICAL EXAM: BP 127/76 mmHg  Pulse 85  Temp(Src) 98.3 F (36.8 C)  Ht '5\' 4"'$  (1.626 m)  Wt 144 lb 9.6 oz (65.59 kg)  BMI 24.81 kg/m2   Well-nourished, well-developed female in no acute distress. Neck: Supple, no lymphadenopathy no thyromegaly. No palpable cervical or supraclavicular adenopathy. Mouth: Partial on top; few teeth missing on bottom. Lungs: Clear to auscultation. Cardiac: Regular rate and rhythm. Abdomen: . Abdomen is soft, nontender, nondistended. There are no palpable masses or hepatosplenomegaly. Groins: No lymphadenopathy. Extremities:  No peripheral edema. Pulses are good. Skin: Birth mark from right flank to right lower back area. 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     ------------------------------------------------  Blair Promise, PhD, MD

## 2016-01-17 NOTE — Progress Notes (Signed)
Patient reports her pain has eased and is now a 3/10.  Will continue to monitor.

## 2016-01-17 NOTE — Interval H&P Note (Signed)
History and Physical Interval Note:  01/17/2016 8:30 AM  Valerie Figueroa  has presented today for surgery, with the diagnosis of cervical cancer  The various methods of treatment have been discussed with the patient and family. After consideration of risks, benefits and other options for treatment, the patient has consented to  Procedure(s): TANDEM RING INSERTION (N/A) as a surgical intervention .  The patient's history has been reviewed, patient examined, no change in status, stable for surgery.  I have reviewed the patient's chart and labs.  Questions were answered to the patient's satisfaction.     Gery Pray

## 2016-01-17 NOTE — Transfer of Care (Signed)
Immediate Anesthesia Transfer of Care Note  Patient: Valerie Figueroa  Procedure(s) Performed: Procedure(s): TANDEM RING INSERTION (N/A)  Patient Location: PACU  Anesthesia Type:General  Level of Consciousness: awake, alert  and oriented  Airway & Oxygen Therapy: Patient Spontanous Breathing and Patient connected to nasal cannula oxygen  Post-op Assessment: Report given to RN  Post vital signs: Reviewed and stable  Last Vitals: 104/64, 80, 16, 100%  Filed Vitals:   01/17/16 0719  BP: 155/81  Pulse: 100  Temp: 36.7 C  Resp: 16    Complications: No apparent anesthesia complications

## 2016-01-17 NOTE — Op Note (Signed)
01/17/2016  9:29 AM  PATIENT:  Valerie Figueroa  60 y.o. female  PRE-OPERATIVE DIAGNOSIS:  cervical cancer  POST-OPERATIVE DIAGNOSIS:  cervical cancer  PROCEDURE:  Procedure(s): TANDEM RING INSERTION (N/A)  SURGEON:  Surgeon(s) and Role:    * Gery Pray, MD - Primary  PHYSICIAN ASSISTANT:   ASSISTANTS: none   ANESTHESIA:   general  EBL:  Total I/O In: 600 [I.V.:600] Out: -   BLOOD ADMINISTERED:none  DRAINS: Urinary Catheter (Foley)   LOCAL MEDICATIONS USED:  NONE  SPECIMEN:  No Specimen  DISPOSITION OF SPECIMEN:  N/A  COUNTS:  YES  TOURNIQUET:  * No tourniquets in log *  DICTATION: Patient was taken to the operating room. Timeout was performed. The patient was prepped and draped in the usual sterile fashion and placed in the dorsolithotomy position. Ultrasound was on standby but given the good position of the cervical sleeve this was not required for the procedure. On inspection the cervical sleeve placed by Dr. Denman George was noted to be in good position.Bimanual and rectovaginal examination revealed the cervix measuring approximately 3 x 3 cm in size no obvious parametrial extension. Questionable right uterosacral ligament involvement. The patient then had a 60 mm, 60 tandem placed within the uterine canal and within the cervical sleeve.. A 60 ring with minimal shielding was placed along the cervical area. A rectal paddle was also placed. The patient tolerated the procedure well  PLAN OF CARE: Transferred to radiation oncology for planning and treatment  PATIENT DISPOSITION:  PACU - hemodynamically stable.   Delay start of Pharmacological VTE agent (>24hrs) due to surgical blood loss or risk of bleeding: not applicable

## 2016-01-17 NOTE — Anesthesia Procedure Notes (Signed)
Procedure Name: LMA Insertion Date/Time: 01/17/2016 8:29 AM Performed by: Bethena Roys T Pre-anesthesia Checklist: Patient identified, Emergency Drugs available, Suction available and Patient being monitored Patient Re-evaluated:Patient Re-evaluated prior to inductionOxygen Delivery Method: Circle System Utilized Preoxygenation: Pre-oxygenation with 100% oxygen Intubation Type: IV induction Ventilation: Mask ventilation without difficulty LMA: LMA inserted LMA Size: 4.0 Number of attempts: 1 Airway Equipment and Method: Bite block Placement Confirmation: positive ETCO2 Dental Injury: Teeth and Oropharynx as per pre-operative assessment

## 2016-01-17 NOTE — Progress Notes (Signed)
IMMEDIATELY FOLLOWING SURGERY: Do not drive or operate machinery for the first twenty four hours after surgery. Do not make any important decisions for twenty four hours after surgery or while taking narcotic pain medications or sedatives. If you develop intractable nausea and vomiting or a severe headache please notify your doctor immediately.   FOLLOW-UP: You do not need to follow up with anesthesia unless specifically instructed to do so.   WOUND CARE INSTRUCTIONS (if applicable): Expect some mild vaginal bleeding, but if large amount of bleeding occurs please contact Dr. Sondra Come at [phone] or the Radiation On-Call physician. Call for any fever greater than 101.0 degrees or increasing vaginal//abdominal pain or trouble urinating.   QUESTIONS?: Please feel free to call your physician or the hospital operator if you have any questions, and they will be happy to assist you. Resume all medications: as listed on your after visit summary. Your next appointment is:  Future Appointments Date Time Provider [place]  01/17/2016 3:00 PM Gery Pray, MD [facility] None  01/25/2016 7:00 AM [facility]-US 1 WL-US [place]  01/25/2016 10:00 AM Gery Pray, MD [facility] None  01/25/2016 2:00 PM Gery Pray, MD [facility] None  02/28/2016 10:00 AM [facility] LAB 3 [facility] None  02/28/2016 10:45 AM Nancy Marus, MD [facility] None  03/11/2016 9:10 AM [facility] DIAG [place] 2 GI-BCGMM GI-BREAST CE  03/11/2016 9:20 AM [facility]G [place] 1 GI-BCGUS GI-BREAST CE  04/01/2016 8:00 AM [facility] LAB 3 [facility] None  04/01/2016 8:30 AM Lennis Marion Downer, MD [facility] None

## 2016-01-17 NOTE — Progress Notes (Signed)
Patient reports pain in her vaginal area at a 7/10.  0.5 mg diluadid given IV per Dr. Sondra Come.  Will continue to monitor.

## 2016-01-17 NOTE — Progress Notes (Signed)
  Radiation Oncology         [phone] ________________________________  Name: Valerie Figueroa MRN: [id number]  Date: 01/17/2016  DOB: 06-09-1956  SIMULATION AND TREATMENT PLANNING NOTE HDR BRACHYTHERAPY  DIAGNOSIS: Stage IIIB cervical cancer  NARRATIVE:  The patient was brought to the [place].  Identity was confirmed.  All relevant records and images related to the planned course of therapy were reviewed.  The patient freely provided informed written consent to proceed with treatment after reviewing the details related to the planned course of therapy. The consent form was witnessed and verified by the simulation staff.  Then, the patient was set-up in a stable reproducible  supine position for radiation therapy.  CT images were obtained.  Surface markings were placed.  The CT images were loaded into the planning software.  Then the target and avoidance structures were contoured.  Treatment planning then occurred.  The radiation prescription was entered and confirmed.   I have requested : Brachytherapy Isodose Plan and Dosimetry Calculations to plan the radiation distribution.    PLAN:  The patient will receive 5.5 Gy in 1 fraction.  The patient will be treated with iridium 192 as the high-dose-rate source using the tandem ring system.  ________________________________  Blair Promise, PhD, MD  This document serves as a record of services personally performed by Gery Pray, MD. It was created on his behalf by Derek Mound, a trained medical scribe. The creation of this record is based on the scribe's personal observations and the provider's statements to them. This document has been checked and approved by the attending provider.

## 2016-01-17 NOTE — Progress Notes (Signed)
Removed left hand IV intact.  Pressure and bandaid applied.  Patient was given discharge paperwork and instructions.

## 2016-01-17 NOTE — Discharge Instructions (Signed)
°  Post Anesthesia Home Care Instructions  Activity: Get plenty of rest for the remainder of the day. A responsible adult should stay with you for 24 hours following the procedure.  For the next 24 hours, DO NOT: -Drive a car -Paediatric nurse -Drink alcoholic beverages -Take any medication unless instructed by your physician -Make any legal decisions or sign important papers.  Meals: Start with liquid foods such as gelatin or soup. Progress to regular foods as tolerated. Avoid greasy, spicy, heavy foods. If nausea and/or vomiting occur, drink only clear liquids until the nausea and/or vomiting subsides. Call your physician if vomiting continues.  Special Instructions/Symptoms: Your throat may feel dry or sore from the anesthesia or the breathing tube placed in your throat during surgery. If this causes discomfort, gargle with warm salt water. The discomfort should disappear within 24 hours.  If you had a scopolamine patch placed behind your ear for the management of post- operative nausea and/or vomiting:  1. The medication in the patch is effective for 72 hours, after which it should be removed.  Wrap patch in a tissue and discard in the trash. Wash hands thoroughly with soap and water. 2. You may remove the patch earlier than 72 hours if you experience unpleasant side effects which may include dry mouth, dizziness or visual disturbances. 3. Avoid touching the patch. Wash your hands with soap and water after contact with the patch.   Call your surgeon if you experience:   1.  Fever over 101.0. 2.  Inability to urinate. 3.  Nausea and/or vomiting. 4.  Extreme swelling or bruising at the surgical site. 5.  Continued bleeding from the incision. 6.  Increased pain, redness or drainage from the incision. 7.  Problems related to your pain medication. 8. Any change in color, movement and/or sensation 9. Any problems and/or concerns

## 2016-01-17 NOTE — Progress Notes (Signed)
  Radiation Oncology         [phone] ________________________________  Name: Valerie Figueroa MRN: [id number]  Date: 01/17/2016  DOB: 07-Jul-1956  CC: [name], MD  Nancy Marus, MD  HDR BRACHYTHERAPY NOTE  DIAGNOSIS: Stage IIIB cervical cancer  NARRATIVE:  Simple treatment device note:  While in the operating room the patient had construction of her custom tandem/ring system. She will be treated with a 60 mm, 60 tandem and 60 ring/paddle system  The patient was brought to the HDR suite. Identity was confirmed. All relevant records and images related to the planned course of therapy were reviewed. The patient freely provided informed written consent to proceed with treatment after reviewing the details related to the planned course of therapy. The consent form was witnessed and verified by the simulation staff. Then, the patient was set-up in a stable reproducible supine position for radiation therapy. The tandem ring system was accessed and fiducial markers were placed within the tandem and ring.   Verification simulation note: An AP and lateral film was obtained through the pelvis area. This was compared to the patient's planning films documenting accurate position of the tandem/ring system for treatment.  High-dose-rate brachytherapy treatment note:  The remote afterloading device was accessed through catheter system and attached to the tandem ring system. Patient then proceeded to undergo her 4th high-dose-rate treatment directed at the cervix. The patient was prescribed a dose of 5.5 gray to be delivered to the [place]. Patient was treated with 2 channels using multiple dwell positions. Treatment time was 344.5 seconds. The patient tolerated the procedure well. After completion of her therapy, a radiation survey was performed documenting return of the iridium source into the [employer] safe. The patient was then transferred to the nursing suite.  She then had removal of  the rectal paddle followed by the tandem and ring system.  the patient tolerated the removal well.  PLAN: Patient will return next week for her fifth high-dose-rate treatment ________________________________  Blair Promise, PhD, MD  This document serves as a record of services personally performed by Gery Pray, MD. It was created on his behalf by Darcus Austin, a trained medical scribe. The creation of this record is based on the scribe's personal observations and the provider's statements to them. This document has been checked and approved by the attending provider.

## 2016-01-17 NOTE — Progress Notes (Signed)
400 cc of urine removed from collection bag.  Removed foley catheter intact.

## 2016-01-18 ENCOUNTER — Encounter (HOSPITAL_BASED_OUTPATIENT_CLINIC_OR_DEPARTMENT_OTHER): Payer: Self-pay | Admitting: Radiation Oncology

## 2016-01-18 NOTE — Progress Notes (Signed)
NPO AFTER MN.  ARRIVE AT 0600.  CURRENT LAB RESULTS IN CHART AND EPIC.  HAD WANDA TO PRINT CERTIFICATE, THIS IS LAST TREATMENT.

## 2016-01-24 ENCOUNTER — Telehealth: Payer: Self-pay | Admitting: Oncology

## 2016-01-24 NOTE — Telephone Encounter (Signed)
Valerie Figueroa called and said she has a cold that started yesterday.  She is wondering if she can have her tandem and ring procedure tomorrow. She reports having an occasional productive cough.  She denies having a fever. Advised her that I will ask Dr. Sondra Come and call her back.

## 2016-01-24 NOTE — Telephone Encounter (Signed)
Per Dr. Sondra Come, it is OK for patient to have treatment as long as it is OK with the OR.  Called Denise in Pre OP who said it is OK for the patient to have treatment as long as she does not have a fever or chest congestion.  Called Gelena back and let her know.

## 2016-01-25 ENCOUNTER — Ambulatory Visit
Admission: RE | Admit: 2016-01-25 | Discharge: 2016-01-25 | Disposition: A | Discharge status: Home / Self Care | Payer: BLUE CROSS/BLUE SHIELD | Source: Ambulatory Visit | Attending: Radiation Oncology | Admitting: Radiation Oncology

## 2016-01-25 ENCOUNTER — Encounter (HOSPITAL_BASED_OUTPATIENT_CLINIC_OR_DEPARTMENT_OTHER)
Admission: RE | Disposition: A | Discharge status: Other Acute Inpatient Hospital | Payer: Self-pay | Source: Ambulatory Visit | Attending: Radiation Oncology

## 2016-01-25 ENCOUNTER — Ambulatory Visit (HOSPITAL_BASED_OUTPATIENT_CLINIC_OR_DEPARTMENT_OTHER)
Admission: RE | Admit: 2016-01-25 | Discharge: 2016-01-25 | Disposition: A | Discharge status: Other Acute Inpatient Hospital | Payer: BLUE CROSS/BLUE SHIELD | Source: Ambulatory Visit | Attending: Radiation Oncology | Admitting: Radiation Oncology

## 2016-01-25 ENCOUNTER — Ambulatory Visit
Admit: 2016-01-25 | Discharge: 2016-01-25 | Disposition: A | Discharge status: Home / Self Care | Payer: BLUE CROSS/BLUE SHIELD | Attending: Radiation Oncology | Admitting: Radiation Oncology

## 2016-01-25 ENCOUNTER — Encounter (HOSPITAL_BASED_OUTPATIENT_CLINIC_OR_DEPARTMENT_OTHER): Payer: Self-pay | Admitting: *Deleted

## 2016-01-25 ENCOUNTER — Encounter: Payer: Self-pay | Admitting: Radiation Oncology

## 2016-01-25 ENCOUNTER — Ambulatory Visit (HOSPITAL_BASED_OUTPATIENT_CLINIC_OR_DEPARTMENT_OTHER): Payer: BLUE CROSS/BLUE SHIELD | Admitting: Anesthesiology

## 2016-01-25 ENCOUNTER — Ambulatory Visit (HOSPITAL_COMMUNITY): Payer: BLUE CROSS/BLUE SHIELD

## 2016-01-25 VITALS — BP 128/82 | HR 95

## 2016-01-25 DIAGNOSIS — Z79899 Other long term (current) drug therapy: Secondary | ICD-10-CM | POA: Insufficient documentation

## 2016-01-25 DIAGNOSIS — F17200 Nicotine dependence, unspecified, uncomplicated: Secondary | ICD-10-CM | POA: Insufficient documentation

## 2016-01-25 DIAGNOSIS — C539 Malignant neoplasm of cervix uteri, unspecified: Secondary | ICD-10-CM

## 2016-01-25 DIAGNOSIS — Z9851 Tubal ligation status: Secondary | ICD-10-CM | POA: Insufficient documentation

## 2016-01-25 DIAGNOSIS — Z510 Encounter for antineoplastic radiation therapy: Secondary | ICD-10-CM | POA: Diagnosis not present

## 2016-01-25 HISTORY — PX: TANDEM RING INSERTION: SHX6199

## 2016-01-25 SURGERY — INSERTION, UTERINE TANDEM AND RING OR CYLINDER, FOR BRACHYTHERAPY
Anesthesia: General | Site: Vagina

## 2016-01-25 MED ORDER — LACTATED RINGERS IV SOLN
INTRAVENOUS | Status: DC
  Administered 2016-01-25 (×2): via INTRAVENOUS
  Filled 2016-01-25: qty 1000

## 2016-01-25 MED ORDER — EPHEDRINE SULFATE 50 MG/ML IJ SOLN
INTRAMUSCULAR | Status: AC
Start: 2016-01-25 — End: 2016-01-25
  Filled 2016-01-25: qty 1

## 2016-01-25 MED ORDER — OXYCODONE HCL 5 MG/5ML PO SOLN
5.0000 mg | Freq: Once | ORAL | Status: DC | PRN
  Filled 2016-01-25: qty 5

## 2016-01-25 MED ORDER — HYDROMORPHONE HCL 1 MG/ML IJ SOLN
0.5000 mg | Freq: Once | INTRAMUSCULAR | Status: AC
  Administered 2016-01-25: 0.5 mg via INTRAVENOUS
  Filled 2016-01-25: qty 1

## 2016-01-25 MED ORDER — ONDANSETRON HCL 4 MG/2ML IJ SOLN
INTRAMUSCULAR | Status: DC | PRN
  Administered 2016-01-25: 4 mg via INTRAVENOUS

## 2016-01-25 MED ORDER — PROPOFOL 10 MG/ML IV BOLUS
INTRAVENOUS | Status: DC | PRN
  Administered 2016-01-25: 130 mg via INTRAVENOUS

## 2016-01-25 MED ORDER — FENTANYL CITRATE (PF) 100 MCG/2ML IJ SOLN
INTRAMUSCULAR | Status: AC
  Filled 2016-01-25: qty 2

## 2016-01-25 MED ORDER — ONDANSETRON HCL 4 MG/2ML IJ SOLN
INTRAMUSCULAR | Status: AC
  Filled 2016-01-25: qty 2

## 2016-01-25 MED ORDER — SODIUM CHLORIDE 0.9 % IJ SOLN
INTRAMUSCULAR | Status: AC
  Filled 2016-01-25: qty 10

## 2016-01-25 MED ORDER — MIDAZOLAM HCL 2 MG/2ML IJ SOLN
INTRAMUSCULAR | Status: AC
  Filled 2016-01-25: qty 2

## 2016-01-25 MED ORDER — EPHEDRINE SULFATE 50 MG/ML IJ SOLN
INTRAMUSCULAR | Status: DC | PRN
  Administered 2016-01-25: 15 mg via INTRAVENOUS
  Administered 2016-01-25: 10 mg via INTRAVENOUS

## 2016-01-25 MED ORDER — MIDAZOLAM HCL 5 MG/5ML IJ SOLN
INTRAMUSCULAR | Status: DC | PRN
  Administered 2016-01-25: 2 mg via INTRAVENOUS

## 2016-01-25 MED ORDER — ONDANSETRON HCL 4 MG/2ML IJ SOLN
4.0000 mg | Freq: Four times a day (QID) | INTRAMUSCULAR | Status: DC | PRN
  Filled 2016-01-25: qty 2

## 2016-01-25 MED ORDER — ARTIFICIAL TEARS OP OINT
TOPICAL_OINTMENT | OPHTHALMIC | Status: AC
  Filled 2016-01-25: qty 3.5

## 2016-01-25 MED ORDER — LIDOCAINE HCL (CARDIAC) 20 MG/ML IV SOLN
INTRAVENOUS | Status: DC | PRN
  Administered 2016-01-25: 60 mg via INTRAVENOUS

## 2016-01-25 MED ORDER — DEXAMETHASONE SODIUM PHOSPHATE 10 MG/ML IJ SOLN
INTRAMUSCULAR | Status: AC
  Filled 2016-01-25: qty 1

## 2016-01-25 MED ORDER — DEXAMETHASONE SODIUM PHOSPHATE 4 MG/ML IJ SOLN
INTRAMUSCULAR | Status: DC | PRN
  Administered 2016-01-25: 10 mg via INTRAVENOUS

## 2016-01-25 MED ORDER — FENTANYL CITRATE (PF) 100 MCG/2ML IJ SOLN
25.0000 ug | INTRAMUSCULAR | Status: DC | PRN
  Filled 2016-01-25: qty 1

## 2016-01-25 MED ORDER — LIDOCAINE HCL (CARDIAC) 20 MG/ML IV SOLN
INTRAVENOUS | Status: AC
  Filled 2016-01-25: qty 5

## 2016-01-25 MED ORDER — WHITE PETROLATUM GEL
Status: AC
  Filled 2016-01-25: qty 5

## 2016-01-25 MED ORDER — OXYCODONE HCL 5 MG PO TABS
5.0000 mg | ORAL_TABLET | Freq: Once | ORAL | Status: DC | PRN
  Filled 2016-01-25: qty 1

## 2016-01-25 MED ORDER — FENTANYL CITRATE (PF) 100 MCG/2ML IJ SOLN
INTRAMUSCULAR | Status: DC | PRN
  Administered 2016-01-25 (×3): 25 ug via INTRAVENOUS

## 2016-01-25 SURGICAL SUPPLY — 36 items
BAG URINE DRAINAGE (UROLOGICAL SUPPLIES) ×3 IMPLANT
BNDG CONFORM 2 STRL LF (GAUZE/BANDAGES/DRESSINGS) IMPLANT
CATH FOLEY 2WAY SLVR  5CC 16FR (CATHETERS) ×2
CATH FOLEY 2WAY SLVR 5CC 16FR (CATHETERS) ×1 IMPLANT
COVER BACK TABLE 60X90IN (DRAPES) ×3 IMPLANT
DRAPE LG THREE QUARTER DISP (DRAPES) ×3 IMPLANT
DRAPE UNDERBUTTOCKS STRL (DRAPE) ×3 IMPLANT
GLOVE BIO SURGEON STRL SZ7.5 (GLOVE) ×9 IMPLANT
GLOVE BIOGEL PI IND STRL 7.5 (GLOVE) ×1 IMPLANT
GLOVE BIOGEL PI INDICATOR 7.5 (GLOVE) ×2
GOWN STRL REUS W/ TWL LRG LVL3 (GOWN DISPOSABLE) ×2 IMPLANT
GOWN STRL REUS W/TWL LRG LVL3 (GOWN DISPOSABLE) ×4
HOLDER FOLEY CATH W/STRAP (MISCELLANEOUS) ×3 IMPLANT
KIT ROOM TURNOVER WOR (KITS) ×3 IMPLANT
LEGGING LITHOTOMY PAIR STRL (DRAPES) ×3 IMPLANT
MANIFOLD NEPTUNE II (INSTRUMENTS) IMPLANT
NEEDLE SPNL 22GX3.5 QUINCKE BK (NEEDLE) IMPLANT
PACK BASIN DAY SURGERY FS (CUSTOM PROCEDURE TRAY) ×3 IMPLANT
PACKING VAGINAL (PACKING) IMPLANT
PAD ABD 8X10 STRL (GAUZE/BANDAGES/DRESSINGS) ×3 IMPLANT
PAD OB MATERNITY 4.3X12.25 (PERSONAL CARE ITEMS) IMPLANT
PAD PREP 24X48 CUFFED NSTRL (MISCELLANEOUS) ×3 IMPLANT
PLUG CATH AND CAP STER (CATHETERS) IMPLANT
SET IRRIG Y TYPE TUR BLADDER L (SET/KITS/TRAYS/PACK) ×3 IMPLANT
SUT PROLENE 0 SH 30 (SUTURE) IMPLANT
SUT SILK 2 0 30  PSL (SUTURE)
SUT SILK 2 0 30 PSL (SUTURE) IMPLANT
SYR BULB IRRIGATION 50ML (SYRINGE) IMPLANT
SYR CONTROL 10ML LL (SYRINGE) IMPLANT
SYRINGE 10CC LL (SYRINGE) ×3 IMPLANT
TOWEL OR 17X24 6PK STRL BLUE (TOWEL DISPOSABLE) ×6 IMPLANT
TRAY DSU PREP LF (CUSTOM PROCEDURE TRAY) ×3 IMPLANT
TUBE CONNECTING 12'X1/4 (SUCTIONS)
TUBE CONNECTING 12X1/4 (SUCTIONS) IMPLANT
WATER STERILE IRR 3000ML UROMA (IV SOLUTION) IMPLANT
WATER STERILE IRR 500ML POUR (IV SOLUTION) ×3 IMPLANT

## 2016-01-25 NOTE — H&P (Signed)
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 female who is  currently undergoing definitive radiation and radiosensitizing chemotherapy. She now ready to undergo her fifth brachytherapy procedure. She is tolerating her treatment well.     PAST MEDICAL HISTORY:  has a past medical history of Anemia; Cervical cancer ([place]); Radiation (08/31/15-09/04/15); and Heart murmur.   PAST SURGICAL HISTORY: Past Surgical History  Procedure Laterality Date  . Ovarian cyst removal Left 01-17-1985  . Carpal tunnel release Bilateral   . Tubal ligation      FAMILY HISTORY: family history includes Cancer in her father.  SOCIAL HISTORY:  reports that she has been smoking Cigarettes. She has a 15 pack-year smoking history. She has never used smokeless tobacco. She reports that she does not drink alcohol or use illicit drugs.  ALLERGIES: Paclitaxel  MEDICATIONS:  No current facility-administered medications for this encounter.   Current Outpatient Prescriptions  Medication Sig Dispense Refill  . acetaminophen (TYLENOL) 500 MG tablet Take 1,000 mg by mouth every 8 (eight) hours as needed for mild pain.     Marland Kitchen albuterol (PROVENTIL HFA;VENTOLIN HFA) 108 (90 BASE) MCG/ACT inhaler Inhale 2 puffs into the lungs every 6 (six) hours as needed for wheezing or shortness of breath. 1 Inhaler 2  . Fe Fum-FA-B Cmp-C-Zn-Mg-Mn-Cu (CENTRATEX) 106-1 MG CAPS Take 1 capsule by mouth daily.    Marland Kitchen loperamide (IMODIUM) 2 MG capsule Take 2 mg by mouth daily as needed for diarrhea or loose stools.     Marland Kitchen LORazepam (ATIVAN) 0.5 MG tablet Place 1  tablet under the tongue or swallow every 6 hours as needed for nausea. Will make drowsy. 20 tablet 0  . Multiple Vitamins-Minerals (CENTRUM ULTRA WOMENS) TABS Take 1 tablet by mouth every morning.    . ondansetron (ZOFRAN) 8 MG tablet Take 1 tablet (8 mg total) by mouth every 8 (eight) hours as needed for nausea or vomiting. 30 tablet 1  . polyethylene glycol (MIRALAX / GLYCOLAX) packet Take 17 g by mouth daily as needed (constipation). Reported on 12/19/2015    . traMADol (ULTRAM) 50 MG tablet Take 1 tablet (50 mg total) by mouth every 8 (eight) hours as needed. (Patient not taking: Reported on 12/19/2015) 20 tablet 0  . loratadine (CLARITIN) 10 MG tablet Take 10 mg by mouth daily. [employer]      REVIEW OF SYSTEMS: A 15 point review of systems is documented in the electronic medical record. This was obtained by the nursing staff. However, I reviewed this with the patient to discuss relevant findings and make appropriate changes. Some fatigue, no pelvic pain or vaginal bleeding  PHYSICAL EXAM: BP 127/76 mmHg  Pulse 85  Temp(Src) 98.3 F (36.8 C)  Ht '5\' 4"'$  (1.626 m)  Wt 144 lb 9.6 oz (65.59 kg)  BMI 24.81 kg/m2   Well-nourished, well-developed female in no acute distress. Neck: Supple, no lymphadenopathy no thyromegaly. No palpable cervical or supraclavicular adenopathy. Mouth: Partial on top; few teeth missing on bottom. Lungs: Clear to auscultation. Cardiac: Regular rate and rhythm. Abdomen: . Abdomen is soft, nontender, nondistended. There are no palpable masses or hepatosplenomegaly. Groins: No lymphadenopathy. Extremities: No peripheral edema. Pulses are good. Skin: Birth mark from right flank to right lower back area. 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 This will complete the patient's treatment.  PLAN :to OR  for tandem ring procedure.    ------------------------------------------------  Blair Promise, PhD, MD

## 2016-01-25 NOTE — Progress Notes (Signed)
Patient reports her pain is improved.  Will continue to monitor.

## 2016-01-25 NOTE — Progress Notes (Signed)
Patient reports having pain at a 7/10 in her vaginal area.  0.5 mg dilaudid IV given per Dr. Sondra Come.  Call light in reach and family present in room.  Will continue to monitor.

## 2016-01-25 NOTE — Progress Notes (Signed)
  Radiation Oncology         [phone] ________________________________  Name: Valerie Figueroa MRN: [id number]  Date: 01/25/2016  DOB: [birth year]  CC: [name], MD  Nancy Marus, MD  HDR BRACHYTHERAPY NOTE  DIAGNOSIS: Stage IIIB cervical cancer  NARRATIVE:  Simple treatment device note:  While in the operating room the patient had construction of her custom tandem/ring system. She will be treated with a 60 mm, 60 tandem and 60 ring/paddle system  The patient was brought to the HDR suite. Identity was confirmed. All relevant records and images related to the planned course of therapy were reviewed. The patient freely provided informed written consent to proceed with treatment after reviewing the details related to the planned course of therapy. The consent form was witnessed and verified by the simulation staff. Then, the patient was set-up in a stable reproducible supine position for radiation therapy. The tandem ring system was accessed and fiducial markers were placed within the tandem and ring.   Verification simulation note: An AP and lateral film was obtained through the pelvis area. This was compared to the patient's planning films documenting accurate position of the tandem/ring system for treatment.  High-dose-rate brachytherapy treatment note:  The remote afterloading device was accessed through catheter system and attached to the tandem ring system. Patient then proceeded to undergo her 5th high-dose-rate treatment directed at the cervix. The patient was prescribed a dose of 5.5 gray to be delivered to the [place]. Patient was treated with 2 channels using multiple dwell positions. Treatment time was 372.2 seconds. The patient tolerated the procedure well. After completion of her therapy, a radiation survey was performed documenting return of the iridium source into the [employer] safe. The patient was then transferred to the nursing suite.  She then had removal of  the rectal paddle followed by the tandem and ring system. The cervical sleeve was also removed.  the patient tolerated the removal well.  PLAN: She completed treatment today and will follow up with me in one month. ________________________________  -----------------------------------  Blair Promise, PhD, MD    This document serves as a record of services personally performed by Gery Pray, MD. It was created on his behalf by Lendon Collar, a trained medical scribe. The creation of this record is based on the scribe's personal observations and the provider's statements to them. This document has been checked and approved by the attending provider.

## 2016-01-25 NOTE — Anesthesia Postprocedure Evaluation (Signed)
Anesthesia Post Note  Patient: Valerie Figueroa  Procedure(s) Performed: Procedure(s) (LRB): TANDEM RING INSERTION/ RING FOR HIGH DOSE RATE RADIATIONTHERAPY (N/A)  Patient location during evaluation: PACU Anesthesia Type: General Level of consciousness: awake and alert and patient cooperative Pain management: pain level controlled Vital Signs Assessment: post-procedure vital signs reviewed and stable Respiratory status: spontaneous breathing and respiratory function stable Cardiovascular status: stable Anesthetic complications: no    Last Vitals:  Filed Vitals:   01/25/16 0915 01/25/16 0930  BP: 112/91 133/66  Pulse: 91 91  Temp:    Resp: 22 22    Last Pain:  Filed Vitals:   01/25/16 0938  PainSc: 0-No pain                 Wellington Winegarden S

## 2016-01-25 NOTE — Transfer of Care (Signed)
Immediate Anesthesia Transfer of Care Note  Patient: Valerie Figueroa  Procedure(s) Performed: Procedure(s): TANDEM RING INSERTION/ RING FOR HIGH DOSE RATE RADIATIONTHERAPY (N/A)  Patient Location: PACU  Anesthesia Type:General  Level of Consciousness: awake, alert , oriented and patient cooperative  Airway & Oxygen Therapy: Patient Spontanous Breathing and Patient connected to nasal cannula oxygen  Post-op Assessment: Report given to RN and Post -op Vital signs reviewed and stable  Post vital signs: Reviewed and stable  Last Vitals:  Filed Vitals:   01/25/16 0622  BP: 158/66  Pulse: 97  Temp: 37.1 C  Resp: 14    Complications: No apparent anesthesia complications

## 2016-01-25 NOTE — Progress Notes (Signed)
  Radiation Oncology         [phone] ________________________________  Name: TINEY ZIPPER MRN: [id number]  Date: 01/25/2016  DOB: 15-Jan-1956  SIMULATION AND TREATMENT PLANNING NOTE HDR BRACHYTHERAPY  DIAGNOSIS:  Stage IIIB cervical cancer  NARRATIVE:  The patient was brought to the [place].  Identity was confirmed.  All relevant records and images related to the planned course of therapy were reviewed.  The patient freely provided informed written consent to proceed with treatment after reviewing the details related to the planned course of therapy. The consent form was witnessed and verified by the simulation staff.  Then, the patient was set-up in a stable reproducible  supine position for radiation therapy.  CT images were obtained.  Surface markings were placed.  The CT images were loaded into the planning software.  Then the target and avoidance structures were contoured.  Treatment planning then occurred.  The radiation prescription was entered and confirmed.   I have requested : Brachytherapy Isodose Plan and Dosimetry Calculations to plan the radiation distribution.    PLAN:  The patient will receive 5.5 Gy in 1 fraction.    ________________________________  -----------------------------------  Blair Promise, PhD, MD    This document serves as a record of services personally performed by Gery Pray, MD. It was created on his behalf by Lendon Collar, a trained medical scribe. The creation of this record is based on the scribe's personal observations and the provider's statements to them. This document has been checked and approved by the attending provider.

## 2016-01-25 NOTE — Discharge Instructions (Signed)
°  Post Anesthesia Home Care Instructions  Activity: Get plenty of rest for the remainder of the day. A responsible adult should stay with you for 24 hours following the procedure.  For the next 24 hours, DO NOT: -Drive a car -Paediatric nurse -Drink alcoholic beverages -Take any medication unless instructed by your physician -Make any legal decisions or sign important papers.  Meals: Start with liquid foods such as gelatin or soup. Progress to regular foods as tolerated. Avoid greasy, spicy, heavy foods. If nausea and/or vomiting occur, drink only clear liquids until the nausea and/or vomiting subsides. Call your physician if vomiting continues.  Special Instructions/Symptoms: Your throat may feel dry or sore from the anesthesia or the breathing tube placed in your throat during surgery. If this causes discomfort, gargle with warm salt water. The discomfort should disappear within 24 hours.  If you had a scopolamine patch placed behind your ear for the management of post- operative nausea and/or vomiting:  1. The medication in the patch is effective for 72 hours, after which it should be removed.  Wrap patch in a tissue and discard in the trash. Wash hands thoroughly with soap and water. 2. You may remove the patch earlier than 72 hours if you experience unpleasant side effects which may include dry mouth, dizziness or visual disturbances. 3. Avoid touching the patch. Wash your hands with soap and water after contact with the patch.   Call your surgeon if you experience:   1.  Fever over 101.0. 2.  Inability to urinate. 3.  Nausea and/or vomiting. 4.  Extreme swelling or bruising at the surgical site. 5.  Continued bleeding from the incision. 6.  Increased pain, redness or drainage from the incision. 7.  Problems related to your pain medication. 8. Any problems and/or concerns

## 2016-01-25 NOTE — Progress Notes (Signed)
Removed foley catheter intact.  Removed IV intact.  Pressure and bandaid applied.

## 2016-01-25 NOTE — Progress Notes (Signed)
Received report from [place], RN in PACU.  Patient denies having pain.  She has LR infusing by gravity into a #20 gauge IV in her left forearm.  She has foley catheter draining clear, yellow urine.  Transported patient via stretcher to CT SIM with Nicki Reaper, Nurse Transporter.

## 2016-01-25 NOTE — Addendum Note (Signed)
Encounter addended by: Jacqulyn Liner, RN on: 01/25/2016  2:41 PM<BR>     Documentation filed: Lines/Drains/Airways Properties Editor, Inpatient Document Flowsheet, Notes Section

## 2016-01-25 NOTE — Brief Op Note (Signed)
01/25/2016  8:36 AM  PATIENT:  Valerie Figueroa  60 y.o. female  PRE-OPERATIVE DIAGNOSIS:  cervical cancer  POST-OPERATIVE DIAGNOSIS:  cervical cancer  PROCEDURE:  Procedure(s): TANDEM RING INSERTION/ RING FOR HIGH DOSE RATE RADIATIONTHERAPY (N/A)  SURGEON:  Surgeon(s) and Role:    * Gery Pray, MD - Primary  PHYSICIAN ASSISTANT:   ASSISTANTS: none   ANESTHESIA:   general  EBL:  Total I/O In: 550 [I.V.:550] Out: - 75  BLOOD ADMINISTERED:none  DRAINS: Urinary Catheter (Foley)   LOCAL MEDICATIONS USED:  NONE  SPECIMEN:  No Specimen  DISPOSITION OF SPECIMEN:  N/A  COUNTS:  YES  TOURNIQUET:  * No tourniquets in log *  DICTATION: Patient was taken to the operating room. Timeout was performed. The patient was prepped and draped in the usual sterile fashion and placed in the dorsolithotomy position. Ultrasound was on standby but given the good position of the cervical sleeve this was not required for the procedure. On inspection the cervical sleeve placed by Dr. Denman George was noted to be in good position.Bimanual and rectovaginal examination revealed the cervix measuring approximately 3 x 3 cm in size no obvious parametrial extension. The sutures holding the cervical sleeve were removed in preparation for cervical sleeve removal after the patient's treatment later today since this is her fifth and final high-dose-rate treatment . The patient then had a 60 mm, 60 tandem placed within the uterine canal and within the cervical sleeve.. A 60 ring with minimal shielding was placed along the cervical area. A rectal paddle was also placed. The patient tolerated the procedure well  PLAN OF CARE: Transferred to radiation oncology for planning and treatment  PATIENT DISPOSITION:  PACU - hemodynamically stable.   Delay start of Pharmacological VTE agent (>24hrs) due to surgical blood loss or risk of bleeding: not applicable

## 2016-01-25 NOTE — Progress Notes (Signed)
IMMEDIATELY FOLLOWING SURGERY: Do not drive or operate machinery for the first twenty four hours after surgery. Do not make any important decisions for twenty four hours after surgery or while taking narcotic pain medications or sedatives. If you develop intractable nausea and vomiting or a severe headache please notify your doctor immediately.   FOLLOW-UP: You do not need to follow up with anesthesia unless specifically instructed to do so.   WOUND CARE INSTRUCTIONS (if applicable): Expect some mild vaginal bleeding, but if large amount of bleeding occurs please contact Dr. Sondra Come at [phone] or the Radiation On-Call physician. Call for any fever greater than 101.0 degrees or increasing vaginal//abdominal pain or trouble urinating.   QUESTIONS?: Please feel free to call your physician or the hospital operator if you have any questions, and they will be happy to assist you. Resume all medications: as listed on your after visit summary. Your next appointment is:  Future Appointments Date Time Provider [place]  02/28/2016 10:00 AM [facility] LAB 3 [facility] None  02/28/2016 10:45 AM Nancy Marus, MD [facility] None  03/11/2016 9:10 AM [facility] DIAG TOMO 2 [facility] GI-BREAST CE  03/11/2016 9:20 AM [facility] [place] 1 [facility] GI-BREAST CE  03/14/2016 10:40 AM Gery Pray, MD [facility] None  04/01/2016 8:00 AM [facility] LAB 3 [facility] None  04/01/2016 8:30 AM Lennis Marion Downer, MD [facility] None

## 2016-01-25 NOTE — Anesthesia Preprocedure Evaluation (Addendum)
Anesthesia Evaluation  Patient identified by MRN, date of birth, ID band Patient awake    Reviewed: Allergy & Precautions, NPO status , Patient's Chart, lab work & pertinent test results  Airway Mallampati: II   Neck ROM: full    Dental  (+) Teeth Intact, Partial Upper   Pulmonary Current Smoker,  Slight cough   breath sounds clear to auscultation       Cardiovascular Exercise Tolerance: Good negative cardio ROS   Rhythm:regular Rate:Normal     Neuro/Psych    GI/Hepatic negative GI ROS, Neg liver ROS,   Endo/Other  negative endocrine ROS  Renal/GU negative Renal ROS     Musculoskeletal negative musculoskeletal ROS (+)   Abdominal   Peds  Hematology  (+) anemia ,   Anesthesia Other Findings   Reproductive/Obstetrics negative OB ROS Cervical CA.                           Anesthesia Physical Anesthesia Plan  ASA: II  Anesthesia Plan: General   Post-op Pain Management:    Induction: Intravenous  Airway Management Planned: LMA  Additional Equipment:   Intra-op Plan:   Post-operative Plan:   Informed Consent: I have reviewed the patients History and Physical, chart, labs and discussed the procedure including the risks, benefits and alternatives for the proposed anesthesia with the patient or authorized representative who has indicated his/her understanding and acceptance.     Plan Discussed with: CRNA, Anesthesiologist and Surgeon  Anesthesia Plan Comments:         Anesthesia Quick Evaluation

## 2016-01-25 NOTE — Transfer of Care (Signed)
Immediate Anesthesia Transfer of Care Note  Patient: Valerie Figueroa  Procedure(s) Performed: Procedure(s): TANDEM RING INSERTION/ RING FOR HIGH DOSE RATE RADIATIONTHERAPY (N/A)  Patient Location: PACU  Anesthesia Type:General  Level of Consciousness: awake, alert , oriented and patient cooperative  Airway & Oxygen Therapy: Patient Spontanous Breathing and Patient connected to nasal cannula oxygen  Post-op Assessment: Report given to RN and Post -op Vital signs reviewed and stable  Post vital signs: Reviewed and stable  Last Vitals:  Filed Vitals:   01/25/16 0622 01/25/16 0815  BP: 158/66 130/71  Pulse: 97 102  Temp: 37.1 C 37.4 C  Resp: 14 23    Complications: No apparent anesthesia complications

## 2016-01-25 NOTE — Anesthesia Procedure Notes (Signed)
Procedure Name: LMA Insertion Date/Time: 01/25/2016 7:31 AM Performed by: Wanita Chamberlain Pre-anesthesia Checklist: Patient identified, Timeout performed, Emergency Drugs available, Suction available and Patient being monitored Patient Re-evaluated:Patient Re-evaluated prior to inductionOxygen Delivery Method: Circle system utilized Preoxygenation: Pre-oxygenation with 100% oxygen Intubation Type: IV induction Ventilation: Mask ventilation without difficulty LMA: LMA inserted LMA Size: 4.0 Number of attempts: 1 Placement Confirmation: breath sounds checked- equal and bilateral and positive ETCO2 Tube secured with: Tape Dental Injury: Teeth and Oropharynx as per pre-operative assessment

## 2016-01-26 ENCOUNTER — Encounter (HOSPITAL_BASED_OUTPATIENT_CLINIC_OR_DEPARTMENT_OTHER): Payer: Self-pay | Admitting: Radiation Oncology

## 2016-02-02 ENCOUNTER — Telehealth: Payer: Self-pay

## 2016-02-02 NOTE — Telephone Encounter (Signed)
Called pt and offered to Rx nicotine patches if pt wants. Pt refused. She stated she is trying on her own. Encouraged her to continue trying. If she changes her mind and wants a nicotine patch Rx to call us.

## 2016-02-11 NOTE — Progress Notes (Signed)
  Radiation Oncology         [phone] ________________________________  Name: Valerie Figueroa MRN: [id number]  Date: 01/25/2016  DOB: [birth year]  End of Treatment Note  Diagnosis:  Stage IIIB squamous cell carcinoma of cervix   Indication for treatment:  Definitive treatment along with radiosensitizing chemotherapy       Radiation treatment dates: The patient had previously received treatments to the pelvis and periaortic area prior to brachytherapy treatments: Brachytherapy treatments given on these dates 12/26/2015,01/04/2016,01/11/2016,01/17/2016,01/25/2016  Site/dose:   The pelvis and periaortic area received an equivalent dose of 45 gray: This was then followed by a boost to the involved nodes and sidewall boost; the cervical area was boosted with 5 brachytherapy treatments using iridium 192 as the high-dose-rate source, the cervical region received 5.5 gray each of her 5 brachytherapy treatments  Beams/energy:   The pelvis and periaortic area was treated with combination of intensity modulated radiation therapy and three-dimensional treatment planning fields; the cervical area was boosted with iridium 192 using the tandem ring system  Narrative: The patient tolerated radiation treatment relatively well.   The patient did have some mild nausea and diarrhea during the course of her external beam treatments as well as a diminished appetite. She tolerated her brachytherapy treatments well  Plan: The patient has completed radiation treatment. The patient will return to radiation oncology clinic for routine followup in one month. I advised them to call or return sooner if they have any questions or concerns related to their recovery or treatment.  -----------------------------------  Blair Promise, PhD, MD

## 2016-02-22 ENCOUNTER — Ambulatory Visit (HOSPITAL_COMMUNITY): Payer: BLUE CROSS/BLUE SHIELD

## 2016-02-27 ENCOUNTER — Other Ambulatory Visit: Payer: Self-pay

## 2016-02-27 DIAGNOSIS — C539 Malignant neoplasm of cervix uteri, unspecified: Secondary | ICD-10-CM

## 2016-02-28 ENCOUNTER — Ambulatory Visit: Payer: BLUE CROSS/BLUE SHIELD | Attending: Gynecologic Oncology | Admitting: Gynecologic Oncology

## 2016-02-28 ENCOUNTER — Encounter: Payer: Self-pay | Admitting: Gynecologic Oncology

## 2016-02-28 ENCOUNTER — Other Ambulatory Visit (HOSPITAL_BASED_OUTPATIENT_CLINIC_OR_DEPARTMENT_OTHER): Payer: BLUE CROSS/BLUE SHIELD

## 2016-02-28 VITALS — BP 131/77 | HR 103 | Temp 98.1°F | Resp 18 | Ht 64.0 in | Wt 153.3 lb

## 2016-02-28 DIAGNOSIS — R918 Other nonspecific abnormal finding of lung field: Secondary | ICD-10-CM | POA: Diagnosis not present

## 2016-02-28 DIAGNOSIS — C539 Malignant neoplasm of cervix uteri, unspecified: Secondary | ICD-10-CM

## 2016-02-28 DIAGNOSIS — N63 Unspecified lump in breast: Secondary | ICD-10-CM | POA: Insufficient documentation

## 2016-02-28 DIAGNOSIS — I2510 Atherosclerotic heart disease of native coronary artery without angina pectoris: Secondary | ICD-10-CM | POA: Insufficient documentation

## 2016-02-28 DIAGNOSIS — K8020 Calculus of gallbladder without cholecystitis without obstruction: Secondary | ICD-10-CM | POA: Diagnosis not present

## 2016-02-28 DIAGNOSIS — Z923 Personal history of irradiation: Secondary | ICD-10-CM | POA: Insufficient documentation

## 2016-02-28 LAB — CBC WITH DIFFERENTIAL/PLATELET
BASO%: 0.1 % (ref 0.0–2.0)
Basophils Absolute: 0 10*3/uL (ref 0.0–0.1)
EOS ABS: 0.1 10*3/uL (ref 0.0–0.5)
EOS%: 0.7 % (ref 0.0–7.0)
HCT: 36.4 % (ref 34.8–46.6)
HEMOGLOBIN: 12.1 g/dL (ref 11.6–15.9)
LYMPH%: 15.3 % (ref 14.0–49.7)
MCH: 34.3 pg — ABNORMAL HIGH (ref 25.1–34.0)
MCHC: 33.2 g/dL (ref 31.5–36.0)
MCV: 103.1 fL — ABNORMAL HIGH (ref 79.5–101.0)
MONO#: 1.2 10*3/uL — ABNORMAL HIGH (ref 0.1–0.9)
MONO%: 14.5 % — AB (ref 0.0–14.0)
NEUT%: 69.4 % (ref 38.4–76.8)
NEUTROS ABS: 5.9 10*3/uL (ref 1.5–6.5)
Platelets: 180 10*3/uL (ref 145–400)
RBC: 3.53 10*6/uL — AB (ref 3.70–5.45)
RDW: 13.9 % (ref 11.2–14.5)
WBC: 8.4 10*3/uL (ref 3.9–10.3)
lymph#: 1.3 10*3/uL (ref 0.9–3.3)

## 2016-02-28 LAB — COMPREHENSIVE METABOLIC PANEL
ALT: 41 U/L (ref 0–55)
AST: 28 U/L (ref 5–34)
Albumin: 3.9 g/dL (ref 3.5–5.0)
Alkaline Phosphatase: 106 U/L (ref 40–150)
Anion Gap: 11 mEq/L (ref 3–11)
BUN: 15.7 mg/dL (ref 7.0–26.0)
CHLORIDE: 104 meq/L (ref 98–109)
CO2: 23 meq/L (ref 22–29)
CREATININE: 0.8 mg/dL (ref 0.6–1.1)
Calcium: 9.6 mg/dL (ref 8.4–10.4)
EGFR: 82 mL/min/{1.73_m2} — ABNORMAL LOW (ref >90)
GLUCOSE: 97 mg/dL (ref 70–140)
Potassium: 4.1 mEq/L (ref 3.5–5.1)
SODIUM: 137 meq/L (ref 136–145)
TOTAL PROTEIN: 7.5 g/dL (ref 6.4–8.3)

## 2016-02-28 NOTE — Patient Instructions (Signed)
Follow-up with your breast imaging as scheduled for March 20. We will schedule a CT scan for when you return to see Dr. Sondra Come on March 30. Follow-up with Dr. Marko Plume as scheduled and return to see [place] in GYN oncology in July.

## 2016-02-28 NOTE — Progress Notes (Signed)
Consult Note: Gyn-Onc  [name] 60 y.o. female  CC:  Chief Complaint  Patient presents with  . Cervical Cancer    MD follow up    HPI: Patient is seen today in consultation at the request of Dr. Mallory Shirk for newly diagnosed cervical cancer.  Patient is a 33 gravida 3 para 3 who was in her usual state of health until May of this year. At that time she began having some vaginal bleeding which stopped spontaneously. She then presented to her local emergency room in August with heavy vaginal bleeding. She was given IV Premarin as well as discharged on oral Premarin. That did help decrease her bleeding. She had a cervical biopsy performed on August 24 that revealed squamous cell carcinoma. She was seen by Dr. Glo Herring in the office and had a large fungating cervical mass with at least parametrial involvement appreciated.  A CT scan of the chest, abdomen and pelvis was performed on August 29. This revealed: CT CHEST FINDINGS  Mediastinum/Nodes: There is a 1.7 cm oval mass within the upper-outer quadrant of the right breast. Visualized thyroid is unremarkable. No enlarged axillary mediastinal or hilar lymphadenopathy. Multiple prominent mediastinal lymph nodes. Normal heart size. Coronary arterial vascular calcifications. No pericardial effusion.  Lungs/Pleura: Central airways are patent. Multiple bilateral pulmonary nodules are demonstrated including a 3 mm right lower lobe pulmonary nodule (image 40; series 6) ; a 2 mm right lower lobe nodule (image 40; series 6) a 2 mm left upper lobe pulmonary nodule (image 27; series 6) and a 1.3 cm nodular ground-glass area within the superior left lower lobe (image 29; series 6). Additional small scattered pulmonary nodules are demonstrated. Additionally within the right lower lobe there is a 2.7 x 1.8 cm irregular nodular opacity (image 47; series 6). No pleural effusion or pneumothorax.  CT ABDOMEN AND PELVIS  FINDINGS Vascular/Lymphatic: Normal caliber abdominal aorta. Multiple prominent and enlarged retroperitoneal lymph nodes are demonstrated including a 13 mm left periaortic lymph node (image 71; series 2) and a 0.9 cm right common iliac lymph node (image 89; series 2). There is a 13 mm right external iliac lymph node (image 102; series 2) and a 0.8 cm left external iliac lymph node (image 109; series 2).  Other: There is a 4.2 x 5.2 x 6.1 cm irregular thick-walled mass involving the cervix and lower uterine segment (image 111; series 2). There is surrounding fat stranding. There is dilatation of the  endometrium.  Musculoskeletal: 10 mm sclerotic focus within the lateral left fifth rib (image 25; series 2).  IMPRESSION: Large irregular mass involving the cervix and lower uterine segment compatible with recently biopsied cervical malignancy. There is associated dilatation of the endometrial canal. Multiple enlarged retroperitoneal and pelvic lymph nodes concerning for metastatic adenopathy.  Abnormal enhancement of the urothelium involving the right renal collecting system and proximal right ureter raising the possibility of an infectious process. Recommend correlation with urinalysis. Posterior to the right kidney there is irregular soft tissue density material which may represent a small amount of infectious/inflammatory stranding of the perinephric fat.  Irregular nodular opacity within the right lower lobe measuring up to 2.7 cm may be secondary to an infectious or inflammatory process however is concerning for metastatic disease in the setting of known cervical malignancy or possible primary pulmonary malignancy.  Multiple additional 2-3 mm nodules within the lungs bilaterally may be infectious or inflammatory in etiology or potentially secondary to metastatic disease. 10 mm sclerotic focus within the lateral  left fifth rib may represent a bone island or osseous metastasis.  Oval mass  within the upper-outer quadrant of the right breast. Recommend dedicated evaluation with mammography if not previously evaluated.  09/06/15 PET: IMPRESSION: 1. Hypermetabolic cervical mass with hypermetabolic abdominal and peritoneal retroperitoneal adenopathy, consistent with the given history of cervical carcinoma. 2. Hypermetabolic left supraclavicular and left axillary lymph nodes. Left axillary lymph nodes are new from 08/21/2015, suggesting an infectious or inflammatory etiology. Attention on followup exams is warranted. 3. Mildly hypermetabolic lateral right breast nodule. Patient underwent mammography and ultrasound on 09/01/2015. 4. Coronary artery calcification. 5. Cholelithiasis.  FNA Breast 09/08/15: Breast, right, needle core biopsy, upper outer quadrant at 10:30 o'clock - FIBROADENOMA WITH CALCIFICATIONS. - THERE IS NO EVIDENCE OF MALIGNANCY.  11/16 CT scan. IMPRESSION: Minimally decreased prominence of sub-cm left axillary lymph nodes,and stable sub-cm left supraclavicular lymph nodes. This suggests a resolving reactive or inflammatory etiology, with metastatic disease considered less likely.  She completed all of her radiation therapy with her last HDR on January 25, 2016. She's been feeling very well since she finished her radiation. She did have a cold and has had a cough with slight yellow. Productive cough. She's eating well she is tired but attributes some of that may be due to the weight she is increased. She states diarrhea that she experienced when she was undergoing external beam radiation a subsequent resolved. She has normal bowel and bladder habits. She continues to smoke but is only smoking approximately 4-5 cigarettes per day. She denies any vaginal bleeding or pain. She has not yet started using her vaginal dilator. She has follow-up with Dr. Sondra Come on March 30. She also has follow-up with repeat imaging of her breast on March 20.  Review of Systems  Constitutional:  Denies fever, good appetite Skin: No rash Cardiovascular: No chest pain, shortness of breath, or edema  Pulmonary: +cough  Gastro Intestinal: Reporting intermittent lower abdominal soreness.  No nausea, vomiting, constipation, or diarrhea reported. No bright red blood per rectum or change in bowel movement.  Genitourinary: NO vaginal bleeding  Musculoskeletal: No myalgia, arthralgia, joint swelling or pain.  Neurologic: +weakness, some fatigue  Current Meds:  Outpatient Encounter Prescriptions as of 02/28/2016  Medication Sig  . loratadine (CLARITIN) 10 MG tablet Take 10 mg by mouth daily. [employer]  . Multiple Vitamins-Minerals (CENTRUM ULTRA WOMENS) TABS Take 1 tablet by mouth every morning.  Marland Kitchen acetaminophen (TYLENOL) 500 MG tablet Take 1,000 mg by mouth every 8 (eight) hours as needed for mild pain. Reported on 02/28/2016  . albuterol (PROVENTIL HFA;VENTOLIN HFA) 108 (90 BASE) MCG/ACT inhaler Inhale 2 puffs into the lungs every 6 (six) hours as needed for wheezing or shortness of breath. (Patient not taking: Reported on 02/28/2016)  . Ferrous Fumarate-Folic Acid 932-6 MG TABS Take 1 tab by mouth daily on an empty stomach with OJ  . loperamide (IMODIUM) 2 MG capsule Take 2 mg by mouth daily as needed for diarrhea or loose stools. Reported on 02/28/2016  . LORazepam (ATIVAN) 0.5 MG tablet Place 1 tablet under the tongue or swallow every 6 hours as needed for nausea. Will make drowsy. (Patient not taking: Reported on 02/28/2016)  . ondansetron (ZOFRAN) 8 MG tablet Take 1 tablet (8 mg total) by mouth every 8 (eight) hours as needed for nausea or vomiting. (Patient not taking: Reported on 02/28/2016)  . polyethylene glycol (MIRALAX / GLYCOLAX) packet Take 17 g by mouth daily as needed (constipation). Reported on 02/28/2016  .  traMADol (ULTRAM) 50 MG tablet Take 1 tablet (50 mg total) by mouth every 8 (eight) hours as needed. (Patient not taking: Reported on 02/28/2016)   No facility-administered encounter  medications on file as of 02/28/2016.    Allergy:  Allergies  Allergen Reactions  . Paclitaxel Anaphylaxis    Taxol    Social Hx:   Social History   Social History  . Marital Status: Married    Spouse Name: N/A  . Number of Children: 3  . Years of Education: N/A   Occupational History  . [employer]    Social History Main Topics  . Smoking status: Current Every Day Smoker -- 0.25 packs/day for 30 years    Types: Cigarettes  . Smokeless tobacco: Never Used  . Alcohol Use: No  . Drug Use: No  . Sexual Activity: Not Currently    Birth Control/ Protection: Surgical   Other Topics Concern  . Not on file   Social History Narrative    Past Surgical Hx:  Past Surgical History  Procedure Laterality Date  . Ovarian cyst removal Left 01-17-1985  . Carpal tunnel release Bilateral   . Tubal ligation    . Tandem ring insertion N/A 12/26/2015    Procedure: TANDEM RING INSERTION;  Surgeon: Gery Pray, MD;  Location: [facility];  Service: Urology;  Laterality: N/A;  . Tandem ring insertion N/A 01/04/2016    Procedure: TANDEM RING INSERTION;  Surgeon: Gery Pray, MD;  Location: [facility];  Service: Urology;  Laterality: N/A;  . Tandem ring insertion N/A 01/11/2016    Procedure: TANDEM RING INSERTION;  Surgeon: Gery Pray, MD;  Location: [facility];  Service: Urology;  Laterality: N/A;  . Tandem ring insertion N/A 01/17/2016    Procedure: TANDEM RING INSERTION;  Surgeon: Gery Pray, MD;  Location: [facility];  Service: Urology;  Laterality: N/A;  . Tandem ring insertion N/A 01/25/2016    Procedure: TANDEM RING INSERTION/ RING FOR HIGH DOSE RATE RADIATIONTHERAPY;  Surgeon: Gery Pray, MD;  Location: [facility];  Service: Urology;  Laterality: N/A;    Past Medical Hx:  Past Medical History  Diagnosis Date  . Radiation 08/31/15-09/04/15    pelvis 9 Gy  . Heart murmur     age 54 not followed by a cardiologist    . Iron deficiency anemia   . Cervical cancer, FIGO stage IIIB [facility]) oncologist-  dr Marko Plume /  dr Sondra Come    Squamous Cell Carcinoma--  s/p chemotherapy and  pelvic external radiation 08-31-2015 to 09-04-2015/  currently getting high dose radaition thru [facility]  . History of postmenopausal bleeding     Oncology Hx:    Cervix cancer ([place]) (Resolved)   08/16/2015 Initial Diagnosis Cervix cancer    Malignant neoplasm of cervix ([place])   08/23/2015 Initial Diagnosis Malignant neoplasm of cervix ([place]). IIIB    - 01/25/2016 Radiation Therapy external beam with cddp and 5 brachytherapy    Family Hx:  Family History  Problem Relation Age of Onset  . Cancer Father     lung    Vitals:  Blood pressure 131/77, pulse 103, temperature 98.1 F (36.7 C), temperature source Oral, resp. rate 18, height '5\' 4"'$  (1.626 m), weight 153 lb 4.8 oz (69.536 kg), last menstrual period 08/25/2015, SpO2 99 %.  Physical Exam:  Well-nourished, well-developed female in no acute distress.  Neck: Supple, no lymphadenopathy no thyromegaly.  Lungs: Inspiratory wheezes. Otherwise clear to auscultation.  Cardiac: Regular  rate and rhythm.  Abdomen: Well-healed transverse incision. Abdomen is soft, nontender, nondistended. There are no palpable masses or hepatosplenomegaly.  Groins: No lymphadenopathy.  Extremities: No edema.  Pelvic: External genitalia within normal limits. Speculum examination reveals a dramatic response. The cervix is very flush with the upper vagina and deviated towards the patient's right. There is no visible lesion. There is no mucosal irregularities. On bimanual examination the cervix is not normal however there is no nodularity. It is somewhat firm. The cervix feels tethered towards the patient's left. On rectovaginal examination there is fibrosis and postradiation changes but there is no nodularity.  Assessment/Plan: 60 year old with clinical IIIB squamous cell carcinoma cervix. There was  concern for stage IV disease but the PET scan did not show that. She had a benign breast mass. She's had a dramatic response to her chemoradiation and is doing very well. She has not yet started using her vaginal dilators and she will discuss this when she returns to see radiation oncology on March 30. We will obtain a CT scan of the abdomen and pelvis at that time to evaluate for lymphadenopathy. She has follow-up in the breast clinic on March 20. After her follow-up with radiation oncology in March she'll return to see [place] in June. I did not perform a Pap smear today as it is only been 5 weeks since she completed her radiation therapy.   Tyla Burgner A., MD 02/28/2016, 10:42 AM

## 2016-03-11 ENCOUNTER — Ambulatory Visit
Admission: RE | Admit: 2016-03-11 | Discharge: 2016-03-11 | Disposition: A | Discharge status: Home / Self Care | Payer: BLUE CROSS/BLUE SHIELD | Source: Ambulatory Visit | Attending: Oncology | Admitting: Oncology

## 2016-03-11 ENCOUNTER — Other Ambulatory Visit: Payer: Self-pay | Admitting: Oncology

## 2016-03-11 DIAGNOSIS — R921 Mammographic calcification found on diagnostic imaging of breast: Secondary | ICD-10-CM

## 2016-03-14 ENCOUNTER — Ambulatory Visit: Payer: Self-pay | Admitting: Radiation Oncology

## 2016-03-21 ENCOUNTER — Ambulatory Visit (HOSPITAL_COMMUNITY)
Admission: RE | Admit: 2016-03-21 | Discharge: 2016-03-21 | Disposition: A | Discharge status: Home / Self Care | Payer: BLUE CROSS/BLUE SHIELD | Source: Ambulatory Visit | Attending: Gynecologic Oncology | Admitting: Gynecologic Oncology

## 2016-03-21 ENCOUNTER — Ambulatory Visit: Payer: BLUE CROSS/BLUE SHIELD | Admitting: Radiation Oncology

## 2016-03-21 ENCOUNTER — Encounter (HOSPITAL_COMMUNITY): Payer: Self-pay

## 2016-03-21 DIAGNOSIS — R188 Other ascites: Secondary | ICD-10-CM | POA: Diagnosis not present

## 2016-03-21 DIAGNOSIS — C539 Malignant neoplasm of cervix uteri, unspecified: Secondary | ICD-10-CM | POA: Insufficient documentation

## 2016-03-21 DIAGNOSIS — K8020 Calculus of gallbladder without cholecystitis without obstruction: Secondary | ICD-10-CM | POA: Diagnosis not present

## 2016-03-21 DIAGNOSIS — I700 Atherosclerosis of aorta: Secondary | ICD-10-CM | POA: Insufficient documentation

## 2016-03-21 DIAGNOSIS — Z9221 Personal history of antineoplastic chemotherapy: Secondary | ICD-10-CM | POA: Diagnosis not present

## 2016-03-21 MED ORDER — IOPAMIDOL (ISOVUE-300) INJECTION 61%
100.0000 mL | Freq: Once | INTRAVENOUS | Status: AC | PRN
  Administered 2016-03-21: 100 mL via INTRAVENOUS

## 2016-03-26 ENCOUNTER — Telehealth: Payer: Self-pay | Admitting: Gynecologic Oncology

## 2016-03-26 NOTE — Telephone Encounter (Signed)
Patient informed of CT scan results.  Advised that the scan results were also sent via email to Dr. Alycia Rossetti at [facility].  Advised that she would be contacted with any further recommendations if any.  No concerns voiced.  Advised to call for any concerns.

## 2016-03-27 ENCOUNTER — Encounter: Payer: Self-pay | Admitting: Oncology

## 2016-03-27 ENCOUNTER — Telehealth: Payer: Self-pay | Admitting: Oncology

## 2016-03-27 ENCOUNTER — Ambulatory Visit
Admission: RE | Admit: 2016-03-27 | Discharge: 2016-03-27 | Disposition: A | Discharge status: Home / Self Care | Payer: BLUE CROSS/BLUE SHIELD | Source: Ambulatory Visit | Attending: Radiation Oncology | Admitting: Radiation Oncology

## 2016-03-27 VITALS — BP 144/70 | HR 104 | Temp 98.2°F | Resp 20 | Ht 64.0 in | Wt 156.0 lb

## 2016-03-27 DIAGNOSIS — C539 Malignant neoplasm of cervix uteri, unspecified: Secondary | ICD-10-CM

## 2016-03-27 NOTE — Progress Notes (Signed)
Radiation Oncology         [phone] ________________________________  Name: Valerie Figueroa MRN: [id number]  Date: 03/27/2016  DOB: 1956/01/24  Follow-Up Visit Note  CC: [name], MD  Nancy Marus, MD  No diagnosis found.  Diagnosis: Stage IIIB squamous cell carcinoma of cervix       Interval Since Last Radiation: 2 months   The pelvis and periaortic area received an equivalent dose of 45 gray: This was then followed by a boost to the involved nodes and sidewall boost; the cervical area was boosted with 5 brachytherapy treatments using iridium 192 as the high-dose-rate source, the cervical region received 5.5 gray each of her 5 brachytherapy treatments. Brachytherapy treatments given on these dates 12/26/2015, 01/04/2016, 01/11/2016, 01/17/2016, 01/25/2016  08/31/15 - 09/04/15: Pelvis 9 Gy in 3 fxs for initial vaginal bleeding issues  Narrative:  The patient returns today for routine follow-up. She reports an occasional painful, burning, tingling, feeling before urination. She reports an episode of sever abdominal cramping last week that gave way to diarrhea last Wednesday through Friday.  The abdominal cramping has subsided. She denies nausea.  She took Imodium and it has stopped. She denies any vaginal/rectal bleeding or discharge. The patient feels she is not quite ready to return to work due to fatigue; which she rates as a 4/10.  ALLERGIES:  is allergic to paclitaxel.  Meds: Current Outpatient Prescriptions  Medication Sig Dispense Refill  . acetaminophen (TYLENOL) 500 MG tablet Take 1,000 mg by mouth every 8 (eight) hours as needed for mild pain. Reported on 02/28/2016    . Ferrous Fumarate-Folic Acid 762-8 MG TABS Take 1 tab by mouth daily on an empty stomach with OJ 30 each 6  . loperamide (IMODIUM) 2 MG capsule Take 2 mg by mouth daily as needed for diarrhea or loose stools. Reported on 03/27/2016    . loratadine (CLARITIN) 10 MG tablet Take 10 mg by mouth daily.  [employer]    . Multiple Vitamins-Minerals (CENTRUM ULTRA WOMENS) TABS Take 1 tablet by mouth every morning.    Marland Kitchen albuterol (PROVENTIL HFA;VENTOLIN HFA) 108 (90 BASE) MCG/ACT inhaler Inhale 2 puffs into the lungs every 6 (six) hours as needed for wheezing or shortness of breath. (Patient not taking: Reported on 02/28/2016) 1 Inhaler 2  . LORazepam (ATIVAN) 0.5 MG tablet Place 1 tablet under the tongue or swallow every 6 hours as needed for nausea. Will make drowsy. (Patient not taking: Reported on 02/28/2016) 20 tablet 0  . ondansetron (ZOFRAN) 8 MG tablet Take 1 tablet (8 mg total) by mouth every 8 (eight) hours as needed for nausea or vomiting. (Patient not taking: Reported on 02/28/2016) 30 tablet 1  . polyethylene glycol (MIRALAX / GLYCOLAX) packet Take 17 g by mouth daily as needed (constipation). Reported on 03/27/2016    . traMADol (ULTRAM) 50 MG tablet Take 1 tablet (50 mg total) by mouth every 8 (eight) hours as needed. (Patient not taking: Reported on 02/28/2016) 20 tablet 0   No current facility-administered medications for this encounter.    Physical Findings: The patient is in no acute distress. Patient is alert and oriented.  height is '5\' 4"'$  (1.626 m) and weight is 156 lb (70.761 kg). Her oral temperature is 98.2 F (36.8 C). Her blood pressure is 144/70 and her pulse is 104. Her respiration is 20.    Lungs are clear to auscultation bilaterally. Heart has regular rate and rhythm. No palpable cervical, supraclavicular, or axillary adenopathy.  No  pelvic exam was performed in light of a recent one conducted by Dr. Alycia Rossetti on 02/28/16.  Lab Findings: Lab Results  Component Value Date   WBC 8.4 02/28/2016   HGB 12.1 02/28/2016   HCT 36.4 02/28/2016   MCV 103.1* 02/28/2016   PLT 180 02/28/2016    Radiographic Findings: Ct Abdomen Pelvis W Contrast  03/21/2016  CLINICAL DATA:  Cervical cancer.  Status post chemotherapy. EXAM: CT ABDOMEN AND PELVIS WITH CONTRAST TECHNIQUE: Multidetector  CT imaging of the abdomen and pelvis was performed using the standard protocol following bolus administration of intravenous contrast. CONTRAST:  166m ISOVUE-300 IOPAMIDOL (ISOVUE-300) INJECTION 61% COMPARISON:  08/21/2015 and 09/06/2015 FINDINGS: Lower chest: There is no pleural effusion. Scarring is noted within the right base. Hepatobiliary: No focal liver abnormality identified. Stones are noted within the gallbladder. These measure up to 11 mm. No gallbladder wall thickening or biliary dilatation. Pancreas: Normal appearance of the pancreas. Spleen: Normal size.  No focal abnormality. Adrenals/Urinary Tract: The adrenal glands are unremarkable. No kidney stones identified. There is scarring and cortical volume loss involving the right kidney. The left kidney is normal. The urinary bladder is unremarkable. Stomach/Bowel: The stomach appears normal. The small bowel loops have a normal course and caliber. Mild wall thickening involving the pelvic large and small bowel loops are noted which may reflect changes secondary to external beam radiation. Vascular/Lymphatic: Calcified atherosclerotic disease involves the abdominal aorta. No aneurysm. Resolution of previous index periaortic adenopathy. No new or enlarging upper abdominal lymph nodes. There is no pelvic or inguinal adenopathy identified. Reproductive: Resolution of previous cervical mass. No adnexal mass identified at this time. Other: There is set trace ascites identified along the right pericolic gutter and in the dependent portion of the pelvis. No peritoneal nodularity identified. Musculoskeletal: No aggressive lytic or sclerotic bone lesions identified. Spondylosis noted within the L5-S1 level. IMPRESSION: 1. Interval response to therapy. There has been resolution of previous surgical mass. Abdominal and pelvic adenopathy has also resolved in the interval. 2. Residual trace ascites identified within the right pericolic gutter an dependent portion of  pelvis. 3. Aortic atherosclerosis. 4. Gallstones. Electronically Signed   By: TKerby MoorsM.D.   On: 03/21/2016 11:32   [place]ast [facility] Uni Right Inc Axilla  03/11/2016  CLINICAL DATA:  Patient with recent diagnosis of right breast fibroadenoma status post ultrasound-guided core needle biopsy. For short-term followup probably benign mass within the subareolar right breast. EXAM: 2D DIGITAL DIAGNOSTIC RIGHT MAMMOGRAM WITH ADJUNCT TOMO ULTRASOUND RIGHT BREAST COMPARISON:  Previous exam(s). ACR Breast Density Category b: There are scattered areas of fibroglandular density. FINDINGS: Stable lobular mass within the upper-outer right breast containing biopsy marking clip compatible with fibroadenoma. Stable 6 mm oval circumscribed low-density mass within the subareolar right breast. On physical exam, I palpate no discrete mass within the subareolar right breast. Targeted ultrasound is performed, showing no discrete subareolar mass to correspond with mammographic abnormality. IMPRESSION: Stable probably benign right breast subareolar mass. RECOMMENDATION: Bilateral diagnostic mammography and possible right breast ultrasound in 6 months. I have discussed the findings and recommendations with the patient. Results were also provided in writing at the conclusion of the visit. If applicable, a reminder letter will be sent to the patient regarding the next appointment. BI-RADS CATEGORY  3: Probably benign. Electronically Signed   By: DLovey NewcomerM.D.   On: 03/11/2016 10:33   Mm Diag Breast Tomo Uni Right  03/11/2016  CLINICAL DATA:  Patient with recent diagnosis of right breast  fibroadenoma status post ultrasound-guided core needle biopsy. For short-term followup probably benign mass within the subareolar right breast. EXAM: 2D DIGITAL DIAGNOSTIC RIGHT MAMMOGRAM WITH ADJUNCT TOMO ULTRASOUND RIGHT BREAST COMPARISON:  Previous exam(s). ACR Breast Density Category b: There are scattered areas of fibroglandular density.  FINDINGS: Stable lobular mass within the upper-outer right breast containing biopsy marking clip compatible with fibroadenoma. Stable 6 mm oval circumscribed low-density mass within the subareolar right breast. On physical exam, I palpate no discrete mass within the subareolar right breast. Targeted ultrasound is performed, showing no discrete subareolar mass to correspond with mammographic abnormality. IMPRESSION: Stable probably benign right breast subareolar mass. RECOMMENDATION: Bilateral diagnostic mammography and possible right breast ultrasound in 6 months. I have discussed the findings and recommendations with the patient. Results were also provided in writing at the conclusion of the visit. If applicable, a reminder letter will be sent to the patient regarding the next appointment. BI-RADS CATEGORY  3: Probably benign. Electronically Signed   By: Lovey Newcomer M.D.   On: 03/11/2016 10:33    Impression:  The patient is recovering from the effects of radiation. Still has significant fatigue which would interfere with her work. Recent exam by Dr. Alycia Rossetti and imaging as above shows excellent response to her treatment.  Plan: Patient was given a size S+ and M Vaginal dilator and surgilube. The patient was educated to apply the surgilube to the dilator and to insert it for 10 minutes 3 times a week (Monday, Wednesday, Friday) and to wash with soap and water after use. The patient verbalized understanding and agreement.  The patient is scheduled to follow up with Dr. Marko Plume on 4/10 and Dr. Alycia Rossetti on 6/28. The patient would follow up with me in September.  ____________________________________ -----------------------------------  Blair Promise, PhD, MD  This document serves as a record of services personally performed by Gery Pray, MD. It was created on his behalf by Darcus Austin, a trained medical scribe. The creation of this record is based on the scribe's personal observations and the provider's  statements to them. This document has been checked and approved by the attending provider.

## 2016-03-27 NOTE — Telephone Encounter (Signed)
Called Valerie Figueroa and let her know that the return to work letter is available for pick up.  Gerrica verbalized agreement and understanding.

## 2016-03-27 NOTE — Telephone Encounter (Signed)
Ronetta called and said she needs a letter from Dr. Sondra Come saying that she can return to work on 05/23/16.  Advised her that we would call her when it is ready.

## 2016-03-27 NOTE — Progress Notes (Signed)
Valerie Figueroa here for follow up.  She reports occasionally having a painful burning tingling feeling before she urinates.  She reports having diarrhea last Wednesday through Friday.  She took Imodium and it has stopped.  She denies having any vaginal/rectal bleeding or discharge.  She reports her energy level is poor.  She said she is not ready to return to work yet.  BP 144/70 mmHg  Pulse 104  Temp(Src) 98.2 F (36.8 C) (Oral)  Resp 20  Ht '5\' 4"'$  (1.626 m)  Wt 156 lb (70.761 kg)  BMI 26.76 kg/m2  LMP 08/25/2015   Wt Readings from Last 3 Encounters:  03/27/16 156 lb (70.761 kg)  02/28/16 153 lb 4.8 oz (69.536 kg)  01/25/16 145 lb (65.772 kg)

## 2016-03-27 NOTE — Progress Notes (Signed)
Patient was given a size S+ and M Vaginal dilator and surgilube.  Patient was educated in teach back mode to apply surgilube to the dilator and to insert for 10 minutes 3 times a week (Monday, Wednesday, Friday) and to wash with soap and water after use.  Patient verbalized understanding and agreement.

## 2016-03-29 ENCOUNTER — Telehealth: Payer: Self-pay

## 2016-03-29 ENCOUNTER — Other Ambulatory Visit: Payer: Self-pay | Admitting: Oncology

## 2016-03-29 DIAGNOSIS — C539 Malignant neoplasm of cervix uteri, unspecified: Secondary | ICD-10-CM

## 2016-03-29 NOTE — Telephone Encounter (Signed)
Discussed rescheduling her appointment on 04-01-16 as noted below by Dr. Marko Plume. Ms. Valerie Figueroa -Valerie Figueroa is agreeable to rescheduling appointment.  R/S to 08-01-16.

## 2016-03-29 NOTE — Telephone Encounter (Signed)
-----   Message from Gordy Levan, MD sent at 03/29/2016 12:31 PM EDT ----- Has seen Dr Alycia Rossetti and Dr Sondra Come in past month, and labs/ right breast imaging/ CT AP all ok  Please let patient know ok if she would like to cancel lab + LL visit on 4-10 and reschedule lab + LL July or early Aug instead. If she prefers to be seen on 4-10 that is fine, does not need lab if so.  Thank you

## 2016-04-01 ENCOUNTER — Other Ambulatory Visit: Payer: BLUE CROSS/BLUE SHIELD

## 2016-04-01 ENCOUNTER — Ambulatory Visit: Payer: BLUE CROSS/BLUE SHIELD | Admitting: Oncology

## 2016-04-26 ENCOUNTER — Encounter: Payer: Self-pay | Admitting: Oncology

## 2016-04-26 NOTE — Progress Notes (Signed)
Faxes done 09/18/15  11/21/15 I sent to medical records

## 2016-06-19 ENCOUNTER — Encounter: Payer: Self-pay | Admitting: Gynecologic Oncology

## 2016-06-19 ENCOUNTER — Other Ambulatory Visit (HOSPITAL_COMMUNITY)
Admission: RE | Admit: 2016-06-19 | Discharge: 2016-06-19 | Disposition: A | Discharge status: Home / Self Care | Payer: BLUE CROSS/BLUE SHIELD | Source: Ambulatory Visit | Attending: Gynecologic Oncology | Admitting: Gynecologic Oncology

## 2016-06-19 ENCOUNTER — Other Ambulatory Visit (HOSPITAL_BASED_OUTPATIENT_CLINIC_OR_DEPARTMENT_OTHER): Payer: BLUE CROSS/BLUE SHIELD

## 2016-06-19 ENCOUNTER — Ambulatory Visit: Payer: BLUE CROSS/BLUE SHIELD | Attending: Gynecologic Oncology | Admitting: Gynecologic Oncology

## 2016-06-19 VITALS — BP 151/70 | HR 88 | Temp 98.5°F | Resp 18 | Ht 64.0 in | Wt 153.9 lb

## 2016-06-19 DIAGNOSIS — R209 Unspecified disturbances of skin sensation: Secondary | ICD-10-CM | POA: Diagnosis not present

## 2016-06-19 DIAGNOSIS — C531 Malignant neoplasm of exocervix: Secondary | ICD-10-CM | POA: Diagnosis not present

## 2016-06-19 DIAGNOSIS — N63 Unspecified lump in breast: Secondary | ICD-10-CM | POA: Diagnosis not present

## 2016-06-19 DIAGNOSIS — R590 Localized enlarged lymph nodes: Secondary | ICD-10-CM | POA: Insufficient documentation

## 2016-06-19 DIAGNOSIS — I2510 Atherosclerotic heart disease of native coronary artery without angina pectoris: Secondary | ICD-10-CM | POA: Diagnosis not present

## 2016-06-19 DIAGNOSIS — F17210 Nicotine dependence, cigarettes, uncomplicated: Secondary | ICD-10-CM | POA: Diagnosis not present

## 2016-06-19 DIAGNOSIS — K8020 Calculus of gallbladder without cholecystitis without obstruction: Secondary | ICD-10-CM | POA: Diagnosis not present

## 2016-06-19 DIAGNOSIS — C539 Malignant neoplasm of cervix uteri, unspecified: Secondary | ICD-10-CM | POA: Insufficient documentation

## 2016-06-19 DIAGNOSIS — Z01411 Encounter for gynecological examination (general) (routine) with abnormal findings: Secondary | ICD-10-CM | POA: Diagnosis present

## 2016-06-19 LAB — COMPREHENSIVE METABOLIC PANEL
ALT: 28 U/L (ref 0–55)
ANION GAP: 9 meq/L (ref 3–11)
AST: 20 U/L (ref 5–34)
Albumin: 3.9 g/dL (ref 3.5–5.0)
Alkaline Phosphatase: 105 U/L (ref 40–150)
BUN: 10.6 mg/dL (ref 7.0–26.0)
CHLORIDE: 107 meq/L (ref 98–109)
CO2: 26 meq/L (ref 22–29)
Calcium: 9.8 mg/dL (ref 8.4–10.4)
Creatinine: 0.7 mg/dL (ref 0.6–1.1)
EGFR: 88 mL/min/{1.73_m2} — AB (ref >90)
Glucose: 96 mg/dl (ref 70–140)
Potassium: 4.1 mEq/L (ref 3.5–5.1)
Sodium: 142 mEq/L (ref 136–145)
Total Bilirubin: <0.3 mg/dL (ref 0.20–1.20)
Total Protein: 7.4 g/dL (ref 6.4–8.3)

## 2016-06-19 LAB — CBC WITH DIFFERENTIAL/PLATELET
BASO%: 0 % (ref 0.0–2.0)
Basophils Absolute: 0 10*3/uL (ref 0.0–0.1)
EOS ABS: 0.1 10*3/uL (ref 0.0–0.5)
EOS%: 1.3 % (ref 0.0–7.0)
HCT: 35.1 % (ref 34.8–46.6)
HGB: 11.7 g/dL (ref 11.6–15.9)
LYMPH%: 21.5 % (ref 14.0–49.7)
MCH: 32 pg (ref 25.1–34.0)
MCHC: 33.3 g/dL (ref 31.5–36.0)
MCV: 95.9 fL (ref 79.5–101.0)
MONO#: 0.7 10*3/uL (ref 0.1–0.9)
MONO%: 12.5 % (ref 0.0–14.0)
NEUT#: 3.5 10*3/uL (ref 1.5–6.5)
NEUT%: 64.7 % (ref 38.4–76.8)
PLATELETS: 194 10*3/uL (ref 145–400)
RBC: 3.66 10*6/uL — AB (ref 3.70–5.45)
RDW: 13.7 % (ref 11.2–14.5)
WBC: 5.5 10*3/uL (ref 3.9–10.3)
lymph#: 1.2 10*3/uL (ref 0.9–3.3)

## 2016-06-19 NOTE — Progress Notes (Signed)
Consult Note: Gyn-Onc  Valerie Figueroa 60 y.o. female  CC:  Chief Complaint  Patient presents with  . Follow-up    HPI: Patient was seen in consultation at the request of Dr. Mallory Shirk for newly diagnosed cervical cancer.  Patient is a 52 gravida 3 para 3 who was in her usual state of health until May of this year. At that time she began having some vaginal bleeding which stopped spontaneously. She then presented to her local emergency room in August with heavy vaginal bleeding. She was given IV Premarin as well as discharged on oral Premarin. That did help decrease her bleeding. She had a cervical biopsy performed on August 24 that revealed squamous cell carcinoma. She was seen by Dr. Glo Herring in the office and had a large fungating cervical mass with at least parametrial involvement appreciated.  A CT scan of the chest, abdomen and pelvis was performed on August 29. This revealed: CT CHEST FINDINGS  Mediastinum/Nodes: There is a 1.7 cm oval mass within the upper-outer quadrant of the right breast. Visualized thyroid is unremarkable. No enlarged axillary mediastinal or hilar lymphadenopathy. Multiple prominent mediastinal lymph nodes. Normal heart size. Coronary arterial vascular calcifications. No pericardial effusion.  Lungs/Pleura: Central airways are patent. Multiple bilateral pulmonary nodules are demonstrated including a 3 mm right lower lobe pulmonary nodule (image 40; series 6) ; a 2 mm right lower lobe nodule (image 40; series 6) a 2 mm left upper lobe pulmonary nodule (image 27; series 6) and a 1.3 cm nodular ground-glass area within the superior left lower lobe (image 29; series 6). Additional small scattered pulmonary nodules are demonstrated. Additionally within the right lower lobe there is a 2.7 x 1.8 cm irregular nodular opacity (image 47; series 6). No pleural effusion or pneumothorax.  CT ABDOMEN AND PELVIS FINDINGS Vascular/Lymphatic: Normal caliber abdominal  aorta. Multiple prominent and enlarged retroperitoneal lymph nodes are demonstrated including a 13 mm left periaortic lymph node (image 71; series 2) and a 0.9 cm right common iliac lymph node (image 89; series 2). There is a 13 mm right external iliac lymph node (image 102; series 2) and a 0.8 cm left external iliac lymph node (image 109; series 2).  Other: There is a 4.2 x 5.2 x 6.1 cm irregular thick-walled mass involving the cervix and lower uterine segment (image 111; series 2). There is surrounding fat stranding. There is dilatation of the  endometrium.  Musculoskeletal: 10 mm sclerotic focus within the lateral left fifth rib (image 25; series 2).  IMPRESSION: Large irregular mass involving the cervix and lower uterine segment compatible with recently biopsied cervical malignancy. There is associated dilatation of the endometrial canal. Multiple enlarged retroperitoneal and pelvic lymph nodes concerning for metastatic adenopathy.  Abnormal enhancement of the urothelium involving the right renal collecting system and proximal right ureter raising the possibility of an infectious process. Recommend correlation with urinalysis. Posterior to the right kidney there is irregular soft tissue density material which may represent a small amount of infectious/inflammatory stranding of the perinephric fat.  Irregular nodular opacity within the right lower lobe measuring up to 2.7 cm may be secondary to an infectious or inflammatory process however is concerning for metastatic disease in the setting of known cervical malignancy or possible primary pulmonary malignancy.  Multiple additional 2-3 mm nodules within the lungs bilaterally may be infectious or inflammatory in etiology or potentially secondary to metastatic disease. 10 mm sclerotic focus within the lateral left fifth rib may represent a bone island  or osseous metastasis.  Oval mass within the upper-outer quadrant of the right breast.  Recommend dedicated evaluation with mammography if not previously evaluated.  09/06/15 PET: IMPRESSION: 1. Hypermetabolic cervical mass with hypermetabolic abdominal and peritoneal retroperitoneal adenopathy, consistent with the given history of cervical carcinoma. 2. Hypermetabolic left supraclavicular and left axillary lymph nodes. Left axillary lymph nodes are new from 08/21/2015, suggesting an infectious or inflammatory etiology. Attention on followup exams is warranted. 3. Mildly hypermetabolic lateral right breast nodule. Patient underwent mammography and ultrasound on 09/01/2015. 4. Coronary artery calcification. 5. Cholelithiasis.  FNA Breast 09/08/15: Breast, right, needle core biopsy, upper outer quadrant at 10:30 o'clock - FIBROADENOMA WITH CALCIFICATIONS. - THERE IS NO EVIDENCE OF MALIGNANCY.  She completed all of her radiation therapy with her last HDR on January 25, 2016. She's been feeling very well since she finished her radiation.  CT 3/17: IMPRESSION: 1. Interval response to therapy. There has been resolution of previous surgical mass. Abdominal and pelvic adenopathy has also resolved in the interval. 2. Residual trace ascites identified within the right pericolic gutter an dependent portion of pelvis. 3. Aortic atherosclerosis. 4. Gallstones.  Interval History: She's overall doing quite well. The biggest issue is that she's noticed over the past month that she's had some tingling and numbness in her anterior shins and feet. Her feet get hot. It started just a month ago and is not related to the chemotherapy that she received. She fell about a month ago secondary to the sensations in her legs. She is not sure if she ever really been tested for diabetes. She does not have a primary care physician. She is up-to-date on her mammogram she's due for her next one in September. She has been using her vaginal dilators. She would like our help in identifying a primary care physician.  She denies any other symptoms of excessive thirst or nocturia. Her diet has been regular. She has no other neurologic signs or symptoms. No headaches or visual changes.  Review of Systems  Constitutional: Denies fever, good appetite Skin: No rash Cardiovascular: No chest pain, shortness of breath, or edema  Pulmonary:No cough  Gastro Intestinal:   No nausea, vomiting, constipation, or diarrhea reported. No bright red blood per rectum or change in bowel movement.  Genitourinary: NO vaginal bleeding  Musculoskeletal: No myalgia, arthralgia, joint swelling or pain.  Neurologic: As above  Current Meds:  Outpatient Encounter Prescriptions as of 06/19/2016  Medication Sig  . acetaminophen (TYLENOL) 500 MG tablet Take 1,000 mg by mouth every 8 (eight) hours as needed for mild pain. Reported on 02/28/2016  . Ferrous Fumarate-Folic Acid 283-6 MG TABS Take 1 tab by mouth daily on an empty stomach with OJ  . loratadine (CLARITIN) 10 MG tablet Take 10 mg by mouth daily. [employer]  . Multiple Vitamins-Minerals (CENTRUM ULTRA WOMENS) TABS Take 1 tablet by mouth every morning.  Marland Kitchen albuterol (PROVENTIL HFA;VENTOLIN HFA) 108 (90 BASE) MCG/ACT inhaler Inhale 2 puffs into the lungs every 6 (six) hours as needed for wheezing or shortness of breath. (Patient not taking: Reported on 02/28/2016)  . loperamide (IMODIUM) 2 MG capsule Take 2 mg by mouth daily as needed for diarrhea or loose stools. Reported on 06/19/2016  . LORazepam (ATIVAN) 0.5 MG tablet Place 1 tablet under the tongue or swallow every 6 hours as needed for nausea. Will make drowsy. (Patient not taking: Reported on 02/28/2016)  . ondansetron (ZOFRAN) 8 MG tablet Take 1 tablet (8 mg total) by mouth every 8 (  eight) hours as needed for nausea or vomiting. (Patient not taking: Reported on 02/28/2016)  . polyethylene glycol (MIRALAX / GLYCOLAX) packet Take 17 g by mouth daily as needed (constipation). Reported on 06/19/2016  . traMADol (ULTRAM) 50 MG tablet Take  1 tablet (50 mg total) by mouth every 8 (eight) hours as needed. (Patient not taking: Reported on 02/28/2016)   No facility-administered encounter medications on file as of 06/19/2016.    Allergy:  Allergies  Allergen Reactions  . Paclitaxel Anaphylaxis    Taxol    Social Hx:   Social History   Social History  . Marital Status: Married    Spouse Name: N/A  . Number of Children: 3  . Years of Education: N/A   Occupational History  . [employer]    Social History Main Topics  . Smoking status: Current Every Day Smoker -- 0.25 packs/day for 30 years    Types: Cigarettes  . Smokeless tobacco: Never Used  . Alcohol Use: No  . Drug Use: No  . Sexual Activity: Not Currently    Birth Control/ Protection: Surgical   Other Topics Concern  . Not on file   Social History Narrative    Past Surgical Hx:  Past Surgical History  Procedure Laterality Date  . Ovarian cyst removal Left 01-17-1985  . Carpal tunnel release Bilateral   . Tubal ligation    . Tandem ring insertion N/A 12/26/2015    Procedure: TANDEM RING INSERTION;  Surgeon: Gery Pray, MD;  Location: [facility];  Service: Urology;  Laterality: N/A;  . Tandem ring insertion N/A 01/04/2016    Procedure: TANDEM RING INSERTION;  Surgeon: Gery Pray, MD;  Location[facility];  Service: Urology;  Laterality: N/A;  . Tandem ring insertion N/A 01/11/2016    Procedure: TANDEM RING INSERTION;  Surgeon: Gery Pray, MD;  Location: [facility];  Service: Urology;  Laterality: N/A;  . Tandem ring insertion N/A 01/17/2016    Procedure: TANDEM RING INSERTION;  Surgeon: Gery Pray, MD;  Location: [facility];  Service: Urology;  Laterality: N/A;  . Tandem ring insertion N/A 01/25/2016    Procedure: TANDEM RING INSERTION/ RING FOR HIGH DOSE RATE RADIATIONTHERAPY;  Surgeon: Gery Pray, MD;  Location: [facility];  Service: Urology;  Laterality: N/A;    Past Medical  Hx:  Past Medical History  Diagnosis Date  . Radiation 08/31/15-09/04/15    pelvis 9 Gy  . Heart murmur     age 67 not followed by a cardiologist   . Iron deficiency anemia   . Cervical cancer, FIGO stage IIIB [facility]) oncologist-  dr Marko Plume /  dr Sondra Come    Squamous Cell Carcinoma--  s/p chemotherapy and  pelvic external radiation 08-31-2015 to 09-04-2015/  currently getting high dose radaition thru [facility]  . History of postmenopausal bleeding   . Cancer ([place])     cervical ca 2016  . Radiation 12/26/15, 01/04/16, 01/11/16, 01/17/16, 01/25/16    brachytherapy tandem and ring to cervical area     Oncology Hx:    Cervix cancer ([place]) (Resolved)   08/16/2015 Initial Diagnosis Cervix cancer    Malignant neoplasm of cervix ([place])   08/23/2015 Initial Diagnosis Malignant neoplasm of cervix ([place]). IIIB    - 01/25/2016 Radiation Therapy external beam with cddp and 5 brachytherapy    Family Hx:  Family History  Problem Relation Age of Onset  . Cancer Father     lung  Vitals:  Blood pressure 151/70, pulse 88, temperature 98.5 F (36.9 C), temperature source Oral, resp. rate 18, height '5\' 4"'$  (1.626 m), weight 153 lb 14.4 oz (69.809 kg), last menstrual period 08/25/2015, SpO2 98 %.  Physical Exam:  Well-nourished, well-developed female in no acute distress.  Neck: Supple, no lymphadenopathy no thyromegaly.  Lungs: Inspiratory wheezes. Otherwise clear to auscultation.  Cardiac: Regular rate and rhythm.  Abdomen: Well-healed transverse incision. Abdomen is soft, nontender, nondistended. There are no palpable masses or hepatosplenomegaly.  Groins: No lymphadenopathy.  Extremities: No edema.  Neuro: Symmetrical sensory on bilateral lower extremities.  Pelvic: External genitalia within normal limits. Speculum examination reveals a dramatic response. The cervix is very flush with the upper vagina and deviated towards the patient's right with some agglutination. There are no visible lesion. There  are no mucosal irregularities. Pap smear submitted without difficulty On bimanual examination the cervix is not normal however there is no nodularity. The cervix feels tethered towards the patient's left. On rectovaginal examination there is fibrosis and postradiation changes but there is no nodularity.  Assessment/Plan: 60 year old with clinical IIIB squamous cell carcinoma cervix. There was concern for stage IV disease but the PET scan did not show that. She had a benign breast mass. She's had a dramatic response to her chemoradiation and is doing very well. She had a CT scan in March that revealed no evidence of recurrent disease. She is using her vaginal dilators a Monday Wednesday and Friday schedule and is evident by exam that she's been compliant. The sensations in her legs are difficult for me to ascertain the etiology. I do not believe is related to chemotherapy-related neuropathy. We will check a CBC and chemistries including glucose level today. We'll make sure she does not have a microcytic anemia. She would like to have [place] help identify primary care physician. Information with the name and number for recommend a primary care physician's was provided. The patient will call and schedule her appointments. She will follow-up with Dr. Sondra Come in 3 months and return to see me in 6 months.   Valerie Marus A., MD 06/19/2016, 12:44 PM

## 2016-06-19 NOTE — Patient Instructions (Signed)
We will contact you with the results of your labs from today along with your pap smear.  Recommendation for PCP, Dr. Kelton Pillar at [phone].  Plan to follow up in six months or sooner if needed.

## 2016-06-20 ENCOUNTER — Telehealth: Payer: Self-pay

## 2016-06-20 NOTE — Telephone Encounter (Signed)
Orders received from [facility] to contact the patient with BMP results being "normal" . Patient contacted , no answer , left a detailed message with lab results being normal and their is no reason to explain her symptoms. Our contact information was given if additional questions arise.

## 2016-06-24 LAB — CYTOLOGY - PAP

## 2016-06-27 ENCOUNTER — Telehealth: Payer: Self-pay

## 2016-06-27 NOTE — Telephone Encounter (Signed)
Orders received from [facility] to contact the patient with her PAP results collected on 06/19/2016 during her visit with Dr Terrilee Files. Result : " limited amount of cells seen , but the ones they saw were benign" . Attempted to contact the patient , no answer , left a detailed message with call back number provided if additional questions arise.

## 2016-07-26 ENCOUNTER — Telehealth: Payer: Self-pay | Admitting: Oncology

## 2016-07-26 NOTE — Telephone Encounter (Signed)
Medical Oncology  Phone call from Dr Sherlynn Stalls requesting that I call back, as he has identified choroidal mass OD,  [phone]. He will not be in office on 07-29-16.  I have LM at # above with my contact #s Patient scheduled to see me on 08-01-16.   Godfrey Pick, MD

## 2016-07-28 ENCOUNTER — Other Ambulatory Visit: Payer: Self-pay | Admitting: Oncology

## 2016-07-28 DIAGNOSIS — R918 Other nonspecific abnormal finding of lung field: Secondary | ICD-10-CM

## 2016-08-01 ENCOUNTER — Telehealth: Payer: Self-pay | Admitting: *Deleted

## 2016-08-01 ENCOUNTER — Ambulatory Visit (HOSPITAL_BASED_OUTPATIENT_CLINIC_OR_DEPARTMENT_OTHER): Payer: BLUE CROSS/BLUE SHIELD

## 2016-08-01 ENCOUNTER — Encounter: Payer: Self-pay | Admitting: Oncology

## 2016-08-01 ENCOUNTER — Telehealth: Payer: Self-pay | Admitting: Oncology

## 2016-08-01 ENCOUNTER — Other Ambulatory Visit (HOSPITAL_BASED_OUTPATIENT_CLINIC_OR_DEPARTMENT_OTHER): Payer: BLUE CROSS/BLUE SHIELD | Admitting: *Deleted

## 2016-08-01 ENCOUNTER — Ambulatory Visit (HOSPITAL_BASED_OUTPATIENT_CLINIC_OR_DEPARTMENT_OTHER): Payer: BLUE CROSS/BLUE SHIELD | Admitting: Oncology

## 2016-08-01 VITALS — BP 142/75 | HR 81 | Temp 98.1°F | Resp 18 | Ht 64.0 in | Wt 154.3 lb

## 2016-08-01 DIAGNOSIS — D500 Iron deficiency anemia secondary to blood loss (chronic): Secondary | ICD-10-CM

## 2016-08-01 DIAGNOSIS — R921 Mammographic calcification found on diagnostic imaging of breast: Secondary | ICD-10-CM

## 2016-08-01 DIAGNOSIS — D509 Iron deficiency anemia, unspecified: Secondary | ICD-10-CM | POA: Diagnosis not present

## 2016-08-01 DIAGNOSIS — C539 Malignant neoplasm of cervix uteri, unspecified: Secondary | ICD-10-CM

## 2016-08-01 DIAGNOSIS — R300 Dysuria: Secondary | ICD-10-CM

## 2016-08-01 DIAGNOSIS — R918 Other nonspecific abnormal finding of lung field: Secondary | ICD-10-CM

## 2016-08-01 DIAGNOSIS — C7949 Secondary malignant neoplasm of other parts of nervous system: Secondary | ICD-10-CM

## 2016-08-01 DIAGNOSIS — C801 Malignant (primary) neoplasm, unspecified: Principal | ICD-10-CM

## 2016-08-01 DIAGNOSIS — N63 Unspecified lump in breast: Secondary | ICD-10-CM

## 2016-08-01 DIAGNOSIS — M899 Disorder of bone, unspecified: Secondary | ICD-10-CM

## 2016-08-01 DIAGNOSIS — Z720 Tobacco use: Secondary | ICD-10-CM

## 2016-08-01 LAB — CBC WITH DIFFERENTIAL/PLATELET
BASO%: 0.2 % (ref 0.0–2.0)
Basophils Absolute: 0 10*3/uL (ref 0.0–0.1)
EOS ABS: 0.1 10*3/uL (ref 0.0–0.5)
EOS%: 0.9 % (ref 0.0–7.0)
HCT: 35.5 % (ref 34.8–46.6)
HEMOGLOBIN: 11.9 g/dL (ref 11.6–15.9)
LYMPH%: 19.8 % (ref 14.0–49.7)
MCH: 32 pg (ref 25.1–34.0)
MCHC: 33.5 g/dL (ref 31.5–36.0)
MCV: 95.4 fL (ref 79.5–101.0)
MONO#: 0.8 10*3/uL (ref 0.1–0.9)
MONO%: 11.9 % (ref 0.0–14.0)
NEUT%: 67.2 % (ref 38.4–76.8)
NEUTROS ABS: 4.3 10*3/uL (ref 1.5–6.5)
Platelets: 200 10*3/uL (ref 145–400)
RBC: 3.72 10*6/uL (ref 3.70–5.45)
RDW: 13.6 % (ref 11.2–14.5)
WBC: 6.3 10*3/uL (ref 3.9–10.3)
lymph#: 1.3 10*3/uL (ref 0.9–3.3)

## 2016-08-01 LAB — COMPREHENSIVE METABOLIC PANEL
ALBUMIN: 3.7 g/dL (ref 3.5–5.0)
ALK PHOS: 105 U/L (ref 40–150)
ALT: 35 U/L (ref 0–55)
AST: 25 U/L (ref 5–34)
Anion Gap: 10 mEq/L (ref 3–11)
BUN: 12.3 mg/dL (ref 7.0–26.0)
CO2: 24 mEq/L (ref 22–29)
Calcium: 10 mg/dL (ref 8.4–10.4)
Chloride: 106 mEq/L (ref 98–109)
Creatinine: 0.7 mg/dL (ref 0.6–1.1)
EGFR: 87 mL/min/{1.73_m2} — ABNORMAL LOW (ref >90)
GLUCOSE: 98 mg/dL (ref 70–140)
Potassium: 4 mEq/L (ref 3.5–5.1)
SODIUM: 139 meq/L (ref 136–145)
TOTAL PROTEIN: 7.5 g/dL (ref 6.4–8.3)

## 2016-08-01 LAB — URINALYSIS, MICROSCOPIC - CHCC
BILIRUBIN (URINE): NEGATIVE
BLOOD: NEGATIVE
Glucose: NEGATIVE mg/dL
Ketones: NEGATIVE mg/dL
Leukocyte Esterase: NEGATIVE
NITRITE: NEGATIVE
Protein: NEGATIVE mg/dL
RBC / HPF: NEGATIVE (ref 0–2)
SPECIFIC GRAVITY, URINE: 1.01 (ref 1.003–1.035)
Urobilinogen, UR: 0.2 mg/dL (ref 0.2–1)
pH: 6 (ref 4.6–8.0)

## 2016-08-01 LAB — CEA (IN HOUSE-CHCC): CEA (CHCC-In House): 3.79 ng/mL (ref 0.00–5.00)

## 2016-08-01 NOTE — Telephone Encounter (Signed)
Called pt to let her know the results of urinalysis. Didn't look liked she had a UTI.  I told pt we would wait to see what the urine culture shows per Dr. Marko Plume and will give her a call with those results.

## 2016-08-01 NOTE — Telephone Encounter (Signed)
appt made and avs printed. Central radiology will contact pt to schedule CT scans

## 2016-08-01 NOTE — Progress Notes (Signed)
OFFICE PROGRESS NOTE   August 02, 2016   Physicians: Nancy Marus, Andree Coss ([phone]) New patient visit Dr Kelton Pillar scheduled 10-14-16  INTERVAL HISTORY:  Patient is seen, alone for visit, as she continues follow up for IIIB squamous cell carcinoma of cervix, on observation since completing IMRT with sensitizing CDDP and HDR in early 01-2016. 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  Patient saw Dr Alycia Rossetti on 06-19-16, her exam with no nodularity at cervix tho fibrosis present, PAP [phone] with no malignancy identified. She is to see Dr Sondra Come in 08-2016 and Dr Alycia Rossetti in 11-2016.  She has return visit to Dr Baird Cancer, I believe in a couple of months.  Most recent imaging was CT AP 03-21-16, CT chest 10-2015 and PET 08-2015.  She had 6 month follow up right diagnostic tomo mammogram and right breast US at [facility] 03-11-16, with right subareolar area still appearing benign, 6 month bilateral mammogram and possible [place] recommended.   Patient has felt generally very well, with good appetite and good energy. For last few days she has had burning sensation in pelvis after she voids, with no symptoms during voiding, no hematuria, no fever, seems to empty bladder well, no flank pain. The discomfort lasts several minutes before subsiding. She has no other abdominal or pelvic pain, bowels are moving well, no swelling LE, no bleeding. She denies increased SOB or other respiratory symptoms, note tobacco abuse. She has no new or different pain, no HA, no other neurologic symptoms. She notices no change or problem with vision. Prior to diagnosis of cervical cancer, she had eye exam by optometrist Joya San), who was concerned about macular degeneration in right eye and recommended follow up exam, which she did not do during subsequent cancer treatment. She saw another optometrist 06-27-16 at  [facility] Clydene Laming), who referred her to Dr Sherlynn Stalls for retinal evaluation. 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  Only other complaint is of fall in parking lot recently when "left knee gave way", sustained some abrasions which have resolved. She has noticed some intermittent unsteadiness with left knee previously, no pain or swelling.   Remainder of 10 point Review of Systems negative   No PAC  ONCOLOGIC HISTORY  Patient has had no regular medical care in years, including no PAP in >15 years. She had no bleeding since menopause until several hours of heavy vaginal bleeding in 04-2015, which resolved other than intermittent spotting until extremely heavy vaginal bleeding again 08-08-15. She was seen in [facility] ED then, with Hgb 8.0; she was given premarin and continued on oral megace x 2 weeks. She was seen by Dr Mallory Shirk on 08-16-15 , with finding of large fungating cervical mass with parametrial involvement. Cervical biopsy 08-17-15 ([id number]) showed moderately to poorly differentiated squamous cell carcinoma. She had CT CAP in [facility] system on 08-21-15, with 1.7 cm oval mass upper outer right breast, multiple prominent mediastinal lymph nodes and multiiple bilateral pulmonary nodules including a 2.7 x 1.8 cm nodule in RLL., retroperitoneal and external iliac adenopathy, 4.2 x 5.2 x 6.1 cm mass involving cervix and lower uterine segment, 1 cm sclerotic lesion in lateral left fifth rib, liver ok and no ureteral obstruction. She saw Dr Alycia Rossetti on 08-23-15, who agreed with  diagnosis of cervical cancer at least IIIB, possibly IV if pulmonary mets. PET pending, recommendation if stage IV to use short course RT to control bleeding then carbo taxol +/- avastin; if PET shows pelvis only then radiation with sensitizing CDDP. Patient saw Dr Sondra Come in consultation on  08-30-15, with vaginal packing required for heavy bleeding. She had urgent radiation thru 09-04-15. PET 09-06-15 had uptake in cervical mass, periaortic and iliac nodes, left supraclavicular node, left subpectoral and axillary nodes, and in right breast mass. Biposy of right breast 09-08-15 benign. Carboplatin taxol was attempted on 09-22-15, with acute, severe taxol reaction after ~ 2 ml taxol had infused, this despite full oral and IV premedication for taxol. Regimen was changed to CDDP gemzar, cycle 1 given 10-06-15 and cycle 2 on 10-19-15. Case was reviewed at multidisciplinary conference, with repeat CT chest favoring reactive nodes over metastatic disease. Decision was made to give definitive radiation with sensitizing CDDP.  She had 5 weekly cycles of CDDP from 11-02-15 thru 12-04-15 with IMRT.  HDR, completed 01-25-16. CT AP 03-21-16 with resolution of cervical mass and of abdominal and pelvic adenopathy.       Objective:  Vital signs in last 24 hours:  BP (!) 142/75 (BP Location: Left Arm, Patient Position: Sitting)   Pulse 81   Temp 98.1 F (36.7 C) (Oral)   Resp 18   Ht 5' 4"  (1.626 m)   Wt 154 lb 4.8 oz (70 kg)   LMP 08/25/2015   SpO2 100%   BMI 26.49 kg/m  Weight up 12 lbs from 12-2015. Looks comfortable, generally better overall than when I had seen her last in Jan, respirations not labored.  Alert, oriented and appropriate. Ambulatory without difficulty, able to get on and off exam table favoring knees No alopecia  HEENT:PERRL, sclerae not icteric. Oral mucosa moist without lesions, posterior pharynx clear.  Neck supple. No JVD.  Lymphatics:no cervical,supraclavicular, axillary or inguinal adenopathy Resp: clear to auscultation bilaterally and normal percussion bilaterally Cardio: regular rate and rhythm. No gallop. GI: soft, nontender, not distended, no mass or organomegaly. Normally active bowel sounds.  Musculoskeletal/ Extremities: Crepitance in knees bilaterally, no  swelling, tenderness or erythema there. Otherwise LE/ UE without pitting edema, cords, tenderness Neuro: no peripheral neuropathy. Otherwise nonfocal. PSYCH appropriate mood and affect Skin without rash, ecchymosis, petechiae Breasts: bilaterally without dominant mass, skin or nipple findings. Axillae benign.   Lab Results:  Results for orders placed or performed in visit on 08/01/16  CBC with Differential  Result Value Ref Range   WBC 6.3 3.9 - 10.3 10e3/uL   NEUT# 4.3 1.5 - 6.5 10e3/uL   HGB 11.9 11.6 - 15.9 g/dL   HCT 35.5 34.8 - 46.6 %   Platelets 200 145 - 400 10e3/uL   MCV 95.4 79.5 - 101.0 fL   MCH 32.0 25.1 - 34.0 pg   MCHC 33.5 31.5 - 36.0 g/dL   RBC 3.72 3.70 - 5.45 10e6/uL   RDW 13.6 11.2 - 14.5 %   lymph# 1.3 0.9 - 3.3 10e3/uL   MONO# 0.8 0.1 - 0.9 10e3/uL   Eosinophils Absolute 0.1 0.0 - 0.5 10e3/uL   Basophils Absolute 0.0 0.0 - 0.1 10e3/uL   NEUT% 67.2 38.4 - 76.8 %   LYMPH% 19.8 14.0 - 49.7 %   MONO% 11.9 0.0 - 14.0 %   EOS% 0.9 0.0 - 7.0 %   BASO% 0.2 0.0 - 2.0 %  Comprehensive metabolic panel  Result Value Ref Range   Sodium  139 136 - 145 mEq/L   Potassium 4.0 3.5 - 5.1 mEq/L   Chloride 106 98 - 109 mEq/L   CO2 24 22 - 29 mEq/L   Glucose 98 70 - 140 mg/dl   BUN 12.3 7.0 - 26.0 mg/dL   Creatinine 0.7 0.6 - 1.1 mg/dL   Total Bilirubin <0.30 0.20 - 1.20 mg/dL   Alkaline Phosphatase 105 40 - 150 U/L   AST 25 5 - 34 U/L   ALT 35 0 - 55 U/L   Total Protein 7.5 6.4 - 8.3 g/dL   Albumin 3.7 3.5 - 5.0 g/dL   Calcium 10.0 8.4 - 10.4 mg/dL   Anion Gap 10 3 - 11 mEq/L   EGFR 87 (L) >90 ml/min/1.73 m2  CEA (IN HOUSE-[facility])  Result Value Ref Range   CEA ([facility]-In House) 3.79 0.00 - 5.00 ng/mL    CBC discussed with patient at time of viist  Studies/Results:  EXAM: CT ABDOMEN AND PELVIS WITH CONTRAST 03-21-16  COMPARISON:  08/21/2015 and 09/06/2015  FINDINGS: Lower chest: There is no pleural effusion. Scarring is noted within the right  base.  Hepatobiliary: No focal liver abnormality identified. Stones are noted within the gallbladder. These measure up to 11 mm. No gallbladder wall thickening or biliary dilatation.  Pancreas: Normal appearance of the pancreas.  Spleen: Normal size.  No focal abnormality.  Adrenals/Urinary Tract: The adrenal glands are unremarkable. No kidney stones identified. There is scarring and cortical volume loss involving the right kidney. The left kidney is normal. The urinary bladder is unremarkable.  Stomach/Bowel: The stomach appears normal. The small bowel loops have a normal course and caliber. Mild wall thickening involving the pelvic large and small bowel loops are noted which may reflect changes secondary to external beam radiation.  Vascular/Lymphatic: Calcified atherosclerotic disease involves the abdominal aorta. No aneurysm. Resolution of previous index periaortic adenopathy. No new or enlarging upper abdominal lymph nodes. There is no pelvic or inguinal adenopathy identified.  Reproductive: Resolution of previous cervical mass. No adnexal mass identified at this time.  Other: There is set trace ascites identified along the right pericolic gutter and in the dependent portion of the pelvis. No peritoneal nodularity identified.  Musculoskeletal: No aggressive lytic or sclerotic bone lesions identified. Spondylosis noted within the L5-S1 level.  IMPRESSION: 1. Interval response to therapy. There has been resolution of previous surgical mass. Abdominal and pelvic adenopathy has also resolved in the interval. 2. Residual trace ascites identified within the right pericolic gutter an dependent portion of pelvis. 3. Aortic atherosclerosis. 4. Gallstones.   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 For short-term followup probably benign mass within the subareolar  right breast.  EXAM: 2D DIGITAL DIAGNOSTIC RIGHT MAMMOGRAM WITH ADJUNCT TOMO  ULTRASOUND RIGHT BREAST  COMPARISON:  Previous exam(s).  ACR Breast Density Category b: There are scattered areas of fibroglandular density.  FINDINGS: Stable lobular mass within the upper-outer right breast containing biopsy marking clip compatible with fibroadenoma. Stable 6 mm oval circumscribed low-density mass within the subareolar right breast.  On physical exam, I palpate no discrete mass within the subareolar right breast.  Targeted ultrasound is performed, showing no discrete subareolar mass to correspond with mammographic abnormality.  IMPRESSION: Stable probably benign right breast subareolar mass.  RECOMMENDATION: Bilateral diagnostic mammography and possible right breast ultrasound in 6 months.  I have discussed the findings and recommendations with the patient. Results were also provided in writing at the  conclusion of the visit. If applicable, a reminder letter will be sent to the patient regarding the next appointment.  BI-RADS CATEGORY  3: Probably benign.       Medications: I have reviewed the patient's current medications.  DISCUSSION Per my discussion with Dr Baird Cancer, will get CT head, chest, abd, pelvis initially, may need PET depending on findings. Would be unusual for cervical cancer to metastasize to choroidal area. Note tobacco abuse. Pulmonary nodules were not PET avid on that imaging at staging for the cervical cancer in 08-2015, left supraclavicular/ subpectoral/ axillary nodes further evaluated as above. Patient understands concern for metastatic disease to right eye and is in agreement with imaging evaluation as above.      Assessment/Plan:  1. Squamous cell carcinoma cervix: moderately to poorly differentiated, initially thought metastic to paraaortic, chest and left supraclavicular nodes. Bleeding initially resolved with short course palliative  radiation, then began chemotherapy. After anaphylaxis to taxol, regimen changed to CDDP gemzar, given 10-06-15 and 10-19-15. Repeat chest imaging 10-24-15 did not suggest metastatic disease, such that she has proceeded with definitive radiation given with sensitizing CDDP x 5 cycles from 11-10 thru 12-04-15 (counts too low for weekly cycle 6 CDDP). IMRT and HDR completed early 01-2016, restaging without evidence of disease. 2. 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 Will get full CTs as above to try to determine likely primary/ extent of any disease otherwise;  may need PET also. CEA resulted after visit normal. I will see patient back after CTs. Appreciate assistance and collaboration from Dr Baird Cancer. 3.long and ongoing tobacco, smoking cessation counseling. 4.possible bladder symptoms at completion of voiding, new. [place] C&S sent and pending 5..right breast mass upper outer quadrant: biopsy with benign findings, 6 month follow up diagnostic mammogram + [place] 03-11-16 stable appears benign, 6 month bilateral mammograms recommended. 6. Radiation diarrhea resolved. Never colonoscopy 7.acute anaphylactic reaction to taxol 09-22-15 despite full premedication 8..iron deficiency anemia by iron studies Sept, due to heavy vaginal bleeding: Transfused 2 units PRBCs + 512m feraheme on 09-11-15. Hemoglobin down below optimal for radiation in late Dec despite oral iron, so transfused 1 unit PRBCs 12-10-16. Continue ferrous fumarate, follow labs. 9.sclerotic rib lesion on CT no correlate on PET. 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 No falls since then 10.peripheral venous access was adequate for chemo 11.[place]  All questions answered and she knows to call if needed. I will see her back in ` 2 weeks after CT scans. Cc  Drs GAlycia Rossetti KBarbette Merinoand will send information to Dr EKelton Pillaras requested for new patient appointment at her office in 09-2016. Time spent 40 min including >50% counseling and coordination of care.    LEvlyn Clines MD   08/02/2016, 11:36 AM

## 2016-08-02 ENCOUNTER — Telehealth: Payer: Self-pay

## 2016-08-02 DIAGNOSIS — C801 Malignant (primary) neoplasm, unspecified: Principal | ICD-10-CM

## 2016-08-02 DIAGNOSIS — C7949 Secondary malignant neoplasm of other parts of nervous system: Secondary | ICD-10-CM | POA: Insufficient documentation

## 2016-08-02 LAB — URINE CULTURE

## 2016-08-02 NOTE — Telephone Encounter (Signed)
Told Valerie Figueroa the results of the Urine culture as noted below by Dr. Marko Plume.

## 2016-08-02 NOTE — Telephone Encounter (Signed)
-----   Message from Gordy Levan, MD sent at 08/02/2016  5:56 PM EDT ----- Labs seen and need follow up : on 08-05-16 can let her know no urine infection. We will see if we can tell anything to cause symptoms on upcoming scans    thanks

## 2016-08-05 ENCOUNTER — Telehealth: Payer: Self-pay | Admitting: Oncology

## 2016-08-05 NOTE — Telephone Encounter (Signed)
Faxed pt records to Dr.Elaine  [facility]

## 2016-08-07 ENCOUNTER — Telehealth: Payer: Self-pay | Admitting: *Deleted

## 2016-08-07 NOTE — Telephone Encounter (Signed)
Called to inform pt that urine culture did not show any infection. No answer but left a detailed message concerning these results to VM and told pt if she is having any S/S to call this nurse back [phone].

## 2016-08-14 ENCOUNTER — Ambulatory Visit (HOSPITAL_COMMUNITY)
Admission: RE | Admit: 2016-08-14 | Discharge: 2016-08-14 | Disposition: A | Discharge status: Home / Self Care | Payer: BLUE CROSS/BLUE SHIELD | Source: Ambulatory Visit | Attending: Oncology | Admitting: Oncology

## 2016-08-14 ENCOUNTER — Encounter (HOSPITAL_COMMUNITY): Payer: Self-pay

## 2016-08-14 DIAGNOSIS — R911 Solitary pulmonary nodule: Secondary | ICD-10-CM | POA: Insufficient documentation

## 2016-08-14 DIAGNOSIS — K8020 Calculus of gallbladder without cholecystitis without obstruction: Secondary | ICD-10-CM | POA: Diagnosis not present

## 2016-08-14 DIAGNOSIS — I700 Atherosclerosis of aorta: Secondary | ICD-10-CM | POA: Insufficient documentation

## 2016-08-14 DIAGNOSIS — R590 Localized enlarged lymph nodes: Secondary | ICD-10-CM | POA: Diagnosis not present

## 2016-08-14 DIAGNOSIS — C7949 Secondary malignant neoplasm of other parts of nervous system: Secondary | ICD-10-CM

## 2016-08-14 DIAGNOSIS — C801 Malignant (primary) neoplasm, unspecified: Secondary | ICD-10-CM

## 2016-08-14 DIAGNOSIS — C539 Malignant neoplasm of cervix uteri, unspecified: Secondary | ICD-10-CM | POA: Diagnosis not present

## 2016-08-14 DIAGNOSIS — I2510 Atherosclerotic heart disease of native coronary artery without angina pectoris: Secondary | ICD-10-CM | POA: Insufficient documentation

## 2016-08-14 MED ORDER — IOPAMIDOL (ISOVUE-300) INJECTION 61%
100.0000 mL | Freq: Once | INTRAVENOUS | Status: AC | PRN
  Administered 2016-08-14: 100 mL via INTRAVENOUS

## 2016-08-15 ENCOUNTER — Other Ambulatory Visit: Payer: Self-pay | Admitting: Oncology

## 2016-08-15 ENCOUNTER — Telehealth: Payer: Self-pay | Admitting: Oncology

## 2016-08-15 DIAGNOSIS — C801 Malignant (primary) neoplasm, unspecified: Principal | ICD-10-CM

## 2016-08-15 DIAGNOSIS — C7949 Secondary malignant neoplasm of other parts of nervous system: Secondary | ICD-10-CM

## 2016-08-15 DIAGNOSIS — C531 Malignant neoplasm of exocervix: Secondary | ICD-10-CM

## 2016-08-15 NOTE — Telephone Encounter (Signed)
Patient returned call to provider  Advised PET scan has been asked for depending on findings of CT scan.  Call transferred ext [phone], voicemail received.

## 2016-08-15 NOTE — Telephone Encounter (Signed)
Medical Oncology  Reached patient on mobile phone. Told her that scans show some new central lymph nodes in chest, otherwise nothing obviously of concern. I have requested PET, as these nodes are not easily accessible to biopsy, and PET may help direct evaluation.  If PET not obtained prior to my scheduled visit on 8-31, may try to move that MD visit later.  Patient understands and is in agreement with recommendation, expressed appreciation for call.  L.Livesay MD

## 2016-08-15 NOTE — Telephone Encounter (Signed)
Medical Oncology  Reports of CTs head, chest abdomen pelvis reviewed and discussed with radiologist (Dr Clovis Riley). 3 new or enlarging lymph nodes in central chest, a tiny peripheral nodule in lung, nothing supraclavicular now.  Central chest adenopathy would not be amenable to percutaneous biopsy, possibly by bronch.  Dr Alycia Rossetti reviewed, not clearly related to cervical cancer.  Tried to reach patient by phone, LM asking her to call back to RN or MD: Need to let her know that I have asked for PET, as we had mentioned might be useful also depending on findings on CT.  L.Livesay MD

## 2016-08-18 ENCOUNTER — Other Ambulatory Visit: Payer: Self-pay | Admitting: Oncology

## 2016-08-19 ENCOUNTER — Other Ambulatory Visit: Payer: Self-pay | Admitting: Oncology

## 2016-08-19 ENCOUNTER — Encounter: Payer: Self-pay | Admitting: Oncology

## 2016-08-19 ENCOUNTER — Telehealth: Payer: Self-pay | Admitting: Oncology

## 2016-08-19 DIAGNOSIS — R918 Other nonspecific abnormal finding of lung field: Secondary | ICD-10-CM

## 2016-08-19 DIAGNOSIS — C539 Malignant neoplasm of cervix uteri, unspecified: Secondary | ICD-10-CM

## 2016-08-19 NOTE — Telephone Encounter (Signed)
08/22/2016 Appointment canceled per patient request. Patient called to cancel appointment and plans to call to reschedule appointment.

## 2016-08-19 NOTE — Progress Notes (Signed)
Medical Oncology  Per radiology scheduling, should be restaging PET instead of initial. Radiology scheduling believes that this needs new preauth. Order placed. Message to p onc manage care. Scheduling message to get PET prior to LL on 9-14  L.Marko Plume

## 2016-08-20 ENCOUNTER — Telehealth: Payer: Self-pay | Admitting: Oncology

## 2016-08-20 NOTE — Telephone Encounter (Signed)
lvm to inform pt of 9/14 appt per LOS

## 2016-08-22 ENCOUNTER — Other Ambulatory Visit: Payer: BLUE CROSS/BLUE SHIELD

## 2016-08-22 ENCOUNTER — Ambulatory Visit: Payer: BLUE CROSS/BLUE SHIELD | Admitting: Oncology

## 2016-08-27 ENCOUNTER — Encounter (HOSPITAL_COMMUNITY): Payer: BLUE CROSS/BLUE SHIELD

## 2016-08-29 ENCOUNTER — Ambulatory Visit: Payer: BLUE CROSS/BLUE SHIELD | Admitting: Radiation Oncology

## 2016-09-03 ENCOUNTER — Ambulatory Visit: Payer: BLUE CROSS/BLUE SHIELD | Admitting: Radiation Oncology

## 2016-09-03 ENCOUNTER — Encounter (HOSPITAL_COMMUNITY)
Admission: RE | Admit: 2016-09-03 | Discharge: 2016-09-03 | Disposition: A | Discharge status: Home / Self Care | Payer: BLUE CROSS/BLUE SHIELD | Source: Ambulatory Visit | Attending: Oncology | Admitting: Oncology

## 2016-09-03 DIAGNOSIS — C539 Malignant neoplasm of cervix uteri, unspecified: Secondary | ICD-10-CM | POA: Insufficient documentation

## 2016-09-03 DIAGNOSIS — R918 Other nonspecific abnormal finding of lung field: Secondary | ICD-10-CM | POA: Diagnosis present

## 2016-09-03 LAB — GLUCOSE, CAPILLARY: GLUCOSE-CAPILLARY: 107 mg/dL — AB (ref 65–99)

## 2016-09-03 MED ORDER — FLUDEOXYGLUCOSE F - 18 (FDG) INJECTION
7.4900 | Freq: Once | INTRAVENOUS | Status: AC | PRN
  Administered 2016-09-03: 7.49 via INTRAVENOUS

## 2016-09-04 ENCOUNTER — Other Ambulatory Visit: Payer: Self-pay | Admitting: Oncology

## 2016-09-05 ENCOUNTER — Ambulatory Visit: Payer: BLUE CROSS/BLUE SHIELD

## 2016-09-05 ENCOUNTER — Encounter: Payer: Self-pay | Admitting: Oncology

## 2016-09-05 ENCOUNTER — Ambulatory Visit (HOSPITAL_BASED_OUTPATIENT_CLINIC_OR_DEPARTMENT_OTHER): Payer: BLUE CROSS/BLUE SHIELD | Admitting: Oncology

## 2016-09-05 ENCOUNTER — Ambulatory Visit (HOSPITAL_BASED_OUTPATIENT_CLINIC_OR_DEPARTMENT_OTHER): Payer: BLUE CROSS/BLUE SHIELD

## 2016-09-05 DIAGNOSIS — N63 Unspecified lump in breast: Secondary | ICD-10-CM | POA: Diagnosis not present

## 2016-09-05 DIAGNOSIS — C539 Malignant neoplasm of cervix uteri, unspecified: Secondary | ICD-10-CM

## 2016-09-05 DIAGNOSIS — M899 Disorder of bone, unspecified: Secondary | ICD-10-CM

## 2016-09-05 DIAGNOSIS — C801 Malignant (primary) neoplasm, unspecified: Secondary | ICD-10-CM

## 2016-09-05 DIAGNOSIS — Z720 Tobacco use: Secondary | ICD-10-CM

## 2016-09-05 DIAGNOSIS — R310 Gross hematuria: Secondary | ICD-10-CM | POA: Diagnosis not present

## 2016-09-05 DIAGNOSIS — C7949 Secondary malignant neoplasm of other parts of nervous system: Secondary | ICD-10-CM

## 2016-09-05 DIAGNOSIS — Z716 Tobacco abuse counseling: Secondary | ICD-10-CM

## 2016-09-05 DIAGNOSIS — R918 Other nonspecific abnormal finding of lung field: Secondary | ICD-10-CM

## 2016-09-05 LAB — URINALYSIS, MICROSCOPIC - CHCC
BILIRUBIN (URINE): NEGATIVE
BLOOD: NEGATIVE
Glucose: NEGATIVE mg/dL
Ketones: NEGATIVE mg/dL
Leukocyte Esterase: NEGATIVE
NITRITE: NEGATIVE
Protein: NEGATIVE mg/dL
RBC / HPF: NEGATIVE (ref 0–2)
SPECIFIC GRAVITY, URINE: 1.01 (ref 1.003–1.035)
UROBILINOGEN UR: 0.2 mg/dL (ref 0.2–1)
pH: 5 (ref 4.6–8.0)

## 2016-09-05 LAB — COMPREHENSIVE METABOLIC PANEL
ALT: 33 U/L (ref 0–55)
AST: 21 U/L (ref 5–34)
Albumin: 3.6 g/dL (ref 3.5–5.0)
Alkaline Phosphatase: 129 U/L (ref 40–150)
Anion Gap: 9 mEq/L (ref 3–11)
BUN: 15.1 mg/dL (ref 7.0–26.0)
CHLORIDE: 108 meq/L (ref 98–109)
CO2: 24 meq/L (ref 22–29)
CREATININE: 1.1 mg/dL (ref 0.6–1.1)
Calcium: 9.8 mg/dL (ref 8.4–10.4)
EGFR: 57 mL/min/{1.73_m2} — ABNORMAL LOW (ref >90)
GLUCOSE: 99 mg/dL (ref 70–140)
Potassium: 4.1 mEq/L (ref 3.5–5.1)
SODIUM: 141 meq/L (ref 136–145)
TOTAL PROTEIN: 7.5 g/dL (ref 6.4–8.3)

## 2016-09-05 LAB — CBC WITH DIFFERENTIAL/PLATELET
BASO%: 0.1 % (ref 0.0–2.0)
Basophils Absolute: 0 10*3/uL (ref 0.0–0.1)
EOS%: 1.2 % (ref 0.0–7.0)
Eosinophils Absolute: 0.1 10*3/uL (ref 0.0–0.5)
HCT: 35 % (ref 34.8–46.6)
HGB: 11.8 g/dL (ref 11.6–15.9)
LYMPH%: 19.1 % (ref 14.0–49.7)
MCH: 31.2 pg (ref 25.1–34.0)
MCHC: 33.7 g/dL (ref 31.5–36.0)
MCV: 92.6 fL (ref 79.5–101.0)
MONO#: 0.8 10*3/uL (ref 0.1–0.9)
MONO%: 11.2 % (ref 0.0–14.0)
NEUT%: 68.4 % (ref 38.4–76.8)
NEUTROS ABS: 4.8 10*3/uL (ref 1.5–6.5)
Platelets: 256 10*3/uL (ref 145–400)
RBC: 3.78 10*6/uL (ref 3.70–5.45)
RDW: 13.8 % (ref 11.2–14.5)
WBC: 7.1 10*3/uL (ref 3.9–10.3)
lymph#: 1.4 10*3/uL (ref 0.9–3.3)

## 2016-09-05 MED ORDER — NICOTINE 21 MG/24HR TD PT24: 21.0000 mg | MEDICATED_PATCH | Freq: Every day | TRANSDERMAL | 0 refills | Status: DC

## 2016-09-05 MED ORDER — NICOTINE 14 MG/24HR TD PT24: 14.0000 mg | MEDICATED_PATCH | Freq: Every day | TRANSDERMAL | 0 refills | Status: DC

## 2016-09-05 NOTE — Progress Notes (Signed)
OFFICE PROGRESS NOTE   September 05, 2016   Physicians: Tomasita Crumble, Judye Bos ([phone]) New patient visit Dr Kelton Pillar scheduled 10-14-16   INTERVAL HISTORY:  Patient is seen, alone for visit, having had CT CAP and head on 08-14-16 and PET on 09-03-16 as there is question of right choroidal metastatic disease per ophthalmology.  She has history of IIIB squamous cell carcinoma of cervix, for which she had initial carboplatin and gemzar x2, followed by radiation with sensitizing CDDP completed 01-2016. CT head now does not show any clear metastatic disease. CT AP does not show local or regional concern for recurrent or progressive cervical cancer. Again she has findings in chest of unclear etiology, with some low uptake in various areas on PET. None of the chest findings with exception of likely the left axillary node would be amenable to percutaneous biopsy.   She is to see Dr Baird Cancer next on Oct 4.   NOTE initial staging of the cervical cancer was not straightforward, work up including CTs and PET as well as benign biopsy of right breast, with eventual decision that CT chest findings were likely reactive.   Patient feels very well, no vision changes prior to evaluation by Dr Baird Cancer in early 07-2016 and none now, feels that vision is good. She denies SOB, cough, chest pain. Energy and appetite are good. She had one episode of painless hematuria last week which she never had previously, was not vaginal bleeding. No dysuria, no fever, no flank pain, no history of nephrolithiasis, no other bleeding. No new or different pain. Bowels ok. No LE swelling. No HA, no other neurologic symptoms. Continues to smoke 1/2 ppd, none at work - discussed.  Remainder of 10 point Review of Systems negative  Patient is back working full time at [employer]. She has limited vacation days which she is trying to use for medical care. She tells me today that her husband has been  incarcerated for past 8 months, after MVA which resulted in deaths. He is 2 hours away; she has seen him once. He should be released summer 2018. Patient is crying as she discusses this.    ONCOLOGIC HISTORY Patient has had no regular medical care in years, including no PAP in >15 years. She had no bleeding since menopause until several hours of heavy vaginal bleeding in 04-2015, which resolved other than intermittent spotting until extremely heavy vaginal bleeding again 08-08-15. She was seen in [facility] ED then, with Hgb 8.0; she was given premarin and continued on oral megace x 2 weeks. She was seen by Dr Mallory Shirk on 08-16-15 , with finding of large fungating cervical mass with parametrial involvement. Cervical biopsy 08-17-15 ([id number]) showed moderately to poorly differentiated squamous cell carcinoma. She had CT CAP in [facility] system on 08-21-15, with 1.7 cm oval mass upper outer right breast, multiple prominent mediastinal lymph nodes and multiiple bilateral pulmonary nodules including a 2.7 x 1.8 cm nodule in RLL., retroperitoneal and external iliac adenopathy, 4.2 x 5.2 x 6.1 cm mass involving cervix and lower uterine segment, 1 cm sclerotic lesion in lateral left fifth rib, liver ok and no ureteral obstruction. She saw Dr Alycia Rossetti on 08-23-15, who agreed with diagnosis of cervical cancer at least IIIB, possibly IV if pulmonary mets. PET pending, recommendation if stage IV to use short course RT to control bleeding then carbo taxol +/- avastin; if PET shows pelvis only then radiation with sensitizing CDDP. Patient saw Dr  Kinard in consultation on 08-30-15, with vaginal packing required for heavy bleeding. She had urgent radiation thru 09-04-15. PET 09-06-15 had uptake in cervical mass, periaortic and iliac nodes, left supraclavicular node, left subpectoral and axillary nodes, and in right breast mass. Biposy of right breast 09-08-15 benign. Carboplatin taxol was attempted on 09-22-15, with acute, severe  taxol reaction after ~ 2 ml taxol had infused, this despite full oral and IV premedication for taxol. Regimen was changed to CDDP gemzar, cycle 1 given 10-06-15 and cycle 2 on 10-19-15. Case was reviewed at multidisciplinary conference, with repeat CT chest favoring reactive nodes over metastatic disease. Decision was made to give definitive radiation with sensitizing CDDP. She had 5 weekly cycles of CDDP from 11-02-15 thru 12-04-15 with IMRT.  HDR, completed 01-25-16. CT AP 03-21-16 with resolution of cervical mass and of abdominal and pelvic adenopathy. Ophthalmlogy exam 07-2016 question of right choroidal met. CT head, CAP 08-14-16 and PET 09-03-16 done for restaging.   Objective:  Vital signs in last 24 hours: Afebrile, respirations not labored RA.  Vitals otherwise were not entered into EMR.   Alert, oriented and appropriate. Easily ambulatory, looks comfortable, respirations not labored.  HEENT:PERRL, sclerae not icteric. Oral mucosa moist without lesions, posterior pharynx clear.  Neck supple. No JVD.  Lymphatics:no cervical,supraclavicular, palpable axillary or inguinal adenopathy Resp: clear to auscultation bilaterally and normal percussion bilaterally Cardio: regular rate and rhythm. No gallop. GI: soft, nontender, not distended, no mass or organomegaly. Normally active bowel sounds.  Musculoskeletal/ Extremities: LE without pitting edema, cords, tenderness Neuro:  nonfocal Skin without rash, ecchymosis, petechiae   Lab Results:  Results for orders placed or performed in visit on 09/05/16  CBC with Differential  Result Value Ref Range   WBC 7.1 3.9 - 10.3 10e3/uL   NEUT# 4.8 1.5 - 6.5 10e3/uL   HGB 11.8 11.6 - 15.9 g/dL   HCT 35.0 34.8 - 46.6 %   Platelets 256 145 - 400 10e3/uL   MCV 92.6 79.5 - 101.0 fL   MCH 31.2 25.1 - 34.0 pg   MCHC 33.7 31.5 - 36.0 g/dL   RBC 3.78 3.70 - 5.45 10e6/uL   RDW 13.8 11.2 - 14.5 %   lymph# 1.4 0.9 - 3.3 10e3/uL   MONO# 0.8 0.1 - 0.9 10e3/uL    Eosinophils Absolute 0.1 0.0 - 0.5 10e3/uL   Basophils Absolute 0.0 0.0 - 0.1 10e3/uL   NEUT% 68.4 38.4 - 76.8 %   LYMPH% 19.1 14.0 - 49.7 %   MONO% 11.2 0.0 - 14.0 %   EOS% 1.2 0.0 - 7.0 %   BASO% 0.1 0.0 - 2.0 %  Comprehensive metabolic panel  Result Value Ref Range   Sodium 141 136 - 145 mEq/L   Potassium 4.1 3.5 - 5.1 mEq/L   Chloride 108 98 - 109 mEq/L   CO2 24 22 - 29 mEq/L   Glucose 99 70 - 140 mg/dl   BUN 15.1 7.0 - 26.0 mg/dL   Creatinine 1.1 0.6 - 1.1 mg/dL   Total Bilirubin <0.30 0.20 - 1.20 mg/dL   Alkaline Phosphatase 129 40 - 150 U/L   AST 21 5 - 34 U/L   ALT 33 0 - 55 U/L   Total Protein 7.5 6.4 - 8.3 g/dL   Albumin 3.6 3.5 - 5.0 g/dL   Calcium 9.8 8.4 - 10.4 mg/dL   Anion Gap 9 3 - 11 mEq/L   EGFR 57 (L) >90 ml/min/1.73 m2     Studies/Results: CLINICAL  DATA:  Subsequent treatment strategy for cervical cancer. Pulmonary nodules  EXAM: NUCLEAR MEDICINE PET SKULL BASE TO THIGH  09-03-16  COMPARISON:  CT 08/14/2016, PET-CT 09/06/2015  FINDINGS: NECK  No hypermetabolic nodes in the neck.  CHEST  New enlarged hypermetabolic mediastinal lymph nodes. For example subcarinal lymph node measuring 20 mm short axis with SUV max equal 20.4.  LEFT lower paratracheal lymph node measures 16 mm short axis with SUV max equal 14.4.  High RIGHT paratracheal lymph node measuring 12 mm with intense metabolic activity.  There is bilateral hilar metabolic activity.  Intensely hypermetabolic LEFT axillary lymph node has normal morphology measuring 8 mm on image 50, series 4 but intense metabolic activity with SUV max equal 5.8.  Review of lung windows demonstrates a subpleural nodule in the RIGHT lower lobe measuring 9 mm (image 57, series 7) with low metabolic activity (SUV max equal 1.6). Small 3 mm RIGHT lower lobe nodule on image 39 is too small to characterize  Within the RIGHT breast, small nodular lesion measuring 13 mm has low metabolic  activity is (SUV max equaled 2.7 compared to 2.2 ). This lesion is not changed in size. Probable biopsy clip within this lesion  ABDOMEN/PELVIS  No abnormal hypermetabolic activity within the liver, pancreas, adrenal glands, or spleen. No hypermetabolic lymph nodes in the abdomen or pelvis.  SKELETON  Photopenia within the lumbar spine related to marrow suppression radiation treatment. No evidence skeletal metastasis.  IMPRESSION: 1. Enlarged Hypermetabolic mediastinal hilar lymph nodes consistent with metastatic adenopathy. 2. Hypermetabolic LEFT axillary lymph node is indeterminate. Normal morphology. 3. Low metabolic activity of the larger RIGHT lower lobe pulmonary nodule favors benign etiology. Smaller RIGHT lobe nodules too small to characterize 4. Small RIGHT breast mass with biopsy clip and low metabolic activity not changed from prior. 5. No evidence of local recurrence or metastasis within the abdomen or pelvis.    Study Result   CLINICAL DATA:  Cervical cancer.  Evaluation for metastatic disease.  EXAM: CT HEAD WITHOUT AND WITH CONTRAST  08-14-16  TECHNIQUE: Contiguous axial images were obtained from the base of the skull through the vertex without and with intravenous contrast  CONTRAST:  147m ISOVUE-300 IOPAMIDOL (ISOVUE-300) INJECTION 61%<Contrast>1082mISOVUE-300 IOPAMIDOL (ISOVUE-300) INJECTION 61%  COMPARISON:  None.  FINDINGS: Brain: No evidence of acute infarction, hemorrhage, hydrocephalus, extra-axial collection or mass lesion/mass effect. No contrast-enhancing lesions  Vascular: No hyperdense vessel or unexpected calcification.  Skull: Normal  Sinuses/Orbits: Normal  Other: None  IMPRESSION: 1. No CT evidence of intracranial metastatic disease. 2. Normal CT appearance of the orbits. MRI of the orbits would be more sensitive for the detection of orbital metastatic disease.     EXAM: CT CHEST, ABDOMEN, AND PELVIS  WITH CONTRAST  08-14-16  COMPARISON:  PET-CT 09/06/2015 and CT abdomen and pelvis from 03/21/2016.  FINDINGS: CT CHEST FINDINGS  Mediastinum/Lymph Nodes: Normal heart size. There is no pericardial effusion identified. Aortic atherosclerosis. Calcification within the RCA and LAD coronary artery identified. No supraclavicular adenopathy. Clip within right breast lesion is identified. Right breast lesion measures 1.5 cm, image 20 of series 6. Previously 1.3 cm. No enlarged axillary lymph nodes. Left paratracheal node is enlarged measuring 1.6 cm, image 20 of series 6. New from previous exam. New posterior mediastinal node measures 1.5 cm, image 27 of series 6. Right paratracheal lymph node is enlarged measuring 0.9 cm, image number 14 of series 6. No enlarged hilar lymph nodes.  Lungs/Pleura: No pleural fluid. Subpleural nodule within the  posterior right lower lobe measures 8 mm and is new from previous exam. Cluster of tree-in-bud nodules and scarring within the posterior lateral right lower lobe is noted and unchanged from previous exam. Likely post infectious or inflammatory.  Musculoskeletal: No suspicious aggressive lytic or sclerotic bone lesions.  CT ABDOMEN PELVIS FINDINGS  Hepatobiliary: No masses or other significant abnormality. Stones identified within the gallbladder which measure up to 1 cm, image 62 of series 6.  Pancreas: No mass, inflammatory changes, or other significant abnormality.  Spleen: The spleen is unremarkable.  Adrenals/Urinary Tract: Normal adrenal glands. Normal appearance of the left kidney. The right kidney is also unremarkable. Urinary bladder is unremarkable.  Stomach/Bowel: The stomach appears normal. The small bowel loops have a normal course and caliber. No bowel obstruction. The appendix is visualized and appears normal.  Vascular/Lymphatic: Calcified atherosclerotic disease involves the abdominal aorta. No aneurysm. No  enlarged retroperitoneal or mesenteric adenopathy. No enlarged pelvic or inguinal lymph nodes.  Reproductive: The uterus and adnexal structures are unremarkable.  Other: No free fluid or fluid collections within the abdomen or pelvis.  Musculoskeletal: Degenerative disc disease is noted at the L5-S1 level. No suspicious bone lesions identified.  IMPRESSION: 1. Interval development of enlarged mediastinal lymph nodes worrisome for metastatic adenopathy. 2. New right lower lobe pulmonary nodule. 3. No evidence for recurrent adenopathy within the abdomen or pelvis. 4. Gallstones 5. Aortic atherosclerosis and multi vessel coronary artery calcification.  PACs images for all of CTs and PET reviewed by MD. CT chest findings discussed with radiologist directly, central nodes not amenable to percutaneous biopsy.  MD reviewed CT chest and PET from fall 2016.  Medications: I have reviewed the patient's current medications. Nicotine patches by prescription if insurance allows  DISCUSSION  Discussed findings on scans as noted; patient understands that now, as with original presentation with the cervical cancer, it is not clear if findings on imaging are metastatic cancer. As she is clinically doing well, I recommend that we follow up with Dr Baird Cancer as planned Oct 4. If he identifies clear progression in right choroidal area, next best step will likely be biopsy of the left axillary node as most amenable location (otherwise will need thoracic surgeon or possibly pulmonologist for subcarinal or hilar nodes).  I would not empirically begin systemic chemotherapy based on information available now.   She has a scheduled appointment with Dr Sondra Come also on 09-18-16, and we will appreciate his thoughts.   I will update Dr Baird Cancer by this note and am glad to discuss with him when her sees her again on Oct 4. Patient will let me know his recommendations after that visit also.   I have talked with  her again about smoking cessation; she is willing to try nicotine patches.  Will send UA C&S. Scans do not show any concern with kidneys or bladder.  May need urology evaluation.   Assessment/Plan:  1. Squamous cell carcinoma cervix: moderately to poorly differentiated, initially thought metastic to paraaortic, chest and left supraclavicular nodes. Bleeding initially resolved with short course palliative radiation, then began chemotherapy. After anaphylaxis to taxol, regimen changed to CDDP gemzar, given 10-06-15 and 10-19-15. Repeat chest imaging 10-24-15 did not suggest metastatic disease, such that she has proceeded with definitive radiation given with sensitizing CDDP x 5 cycles from 11-10 thru 12-04-15 (counts too low for weekly cycle 6 CDDP). IMRT and HDR completed early 01-2016, restaging then without evidence of disease. 2. 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 Will consider continued observation vs biopsy depending on reevaluation of right choroidal area in ~ 2 weeks. Appreciate assistance and collaboration from Dr Baird Cancer. 3.long and ongoing tobacco, smoking cessation counseling, patient now willing to address. Nicotine patches. 4.gross hematuria in past week. UA, C&S. Scans as noted. May need urology referral. 5..right breast mass upper outer quadrant: biopsy with benign findings, 6 month follow up diagnostic mammogram + [place] 03-11-16 stable appears benign, 6 month bilateral mammograms recommended. 6. Radiation diarrhea resolved. Never colonoscopy 7.acute anaphylactic reaction to taxol 09-22-15 despite full premedication 8..iron deficiency anemia by iron studies at presentation with cervical cancer with heavy vaginal bleeding: Transfused and IV feraheme then. Hemoglobin stable. 9.sclerotic rib lesion on CT no correlate on PET. 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 No  falls since then 10.peripheral venous access was adequate for chemo 11 Social:.Lives in [place], but has wanted treatment in [place]. I had not known previously that husband has been in prison for last 8 months for MVA. 12.needs flu vaccine, which can be done at this office when she is seen by Dr Sondra Come on 09-18-16.   All questions answered and patient is in agreement with recommendations and plans.  cc Drs Baird Cancer, Melissa Montane (new patient apt 10-14-16) Time spent 25 min including >50% counseling and coordination of care.     Evlyn Clines, MD   09/05/2016, 4:20 PM

## 2016-09-06 ENCOUNTER — Telehealth: Payer: Self-pay | Admitting: Oncology

## 2016-09-06 NOTE — Telephone Encounter (Signed)
lvm to inform pt of inj appt 9/27 and f/u per LL Oct 30

## 2016-09-07 LAB — URINE CULTURE

## 2016-09-08 DIAGNOSIS — R310 Gross hematuria: Secondary | ICD-10-CM | POA: Insufficient documentation

## 2016-09-09 ENCOUNTER — Telehealth: Payer: Self-pay

## 2016-09-09 NOTE — Telephone Encounter (Signed)
-----   Message from Gordy Levan, MD sent at 09/06/2016  2:53 PM EDT ----- Labs seen and need follow up: when urine culture results, need to let her know about that -  When call about culture, can tell her no blood in the urine either

## 2016-09-09 NOTE — Telephone Encounter (Signed)
Told Ms Remmers the the results of the U?A and Urine culture from 09-05-16 as noted below by Dr. Marko Plume.

## 2016-09-09 NOTE — Telephone Encounter (Signed)
lvm no uti per Dr Marko Plume attached message

## 2016-09-09 NOTE — Telephone Encounter (Signed)
-----   Message from Gordy Levan, MD sent at 09/07/2016  8:59 PM EDT ----- Labs seen and need follow up Please let her know no UTI

## 2016-09-09 NOTE — Telephone Encounter (Signed)
Valerie Levan, MD  Baruch Merl, RN Cc: Janace Hoard, RN        Labs seen and need follow up  Please let her know no UTI

## 2016-09-18 ENCOUNTER — Ambulatory Visit
Admission: RE | Admit: 2016-09-18 | Discharge: 2016-09-18 | Disposition: A | Discharge status: Home / Self Care | Payer: BLUE CROSS/BLUE SHIELD | Source: Ambulatory Visit | Attending: Radiation Oncology | Admitting: Radiation Oncology

## 2016-09-18 ENCOUNTER — Ambulatory Visit (HOSPITAL_BASED_OUTPATIENT_CLINIC_OR_DEPARTMENT_OTHER): Payer: BLUE CROSS/BLUE SHIELD

## 2016-09-18 ENCOUNTER — Encounter: Payer: Self-pay | Admitting: Radiation Oncology

## 2016-09-18 VITALS — BP 152/80 | HR 96 | Temp 98.8°F | Resp 20

## 2016-09-18 VITALS — BP 162/61 | HR 83 | Temp 98.3°F | Ht 64.0 in | Wt 157.4 lb

## 2016-09-18 DIAGNOSIS — Z888 Allergy status to other drugs, medicaments and biological substances status: Secondary | ICD-10-CM | POA: Insufficient documentation

## 2016-09-18 DIAGNOSIS — C539 Malignant neoplasm of cervix uteri, unspecified: Secondary | ICD-10-CM

## 2016-09-18 DIAGNOSIS — Z79899 Other long term (current) drug therapy: Secondary | ICD-10-CM | POA: Diagnosis not present

## 2016-09-18 DIAGNOSIS — Z923 Personal history of irradiation: Secondary | ICD-10-CM | POA: Diagnosis not present

## 2016-09-18 DIAGNOSIS — Z23 Encounter for immunization: Secondary | ICD-10-CM

## 2016-09-18 MED ORDER — INFLUENZA VAC SPLIT QUAD 0.5 ML IM SUSY
0.5000 mL | PREFILLED_SYRINGE | Freq: Once | INTRAMUSCULAR | Status: AC
  Administered 2016-09-18: 0.5 mL via INTRAMUSCULAR
  Filled 2016-09-18: qty 0.5

## 2016-09-18 NOTE — Progress Notes (Signed)
Valerie Figueroa is here for follow up after treatment to her pelvis.  She denies having pain.  She reports she saw a Retinal Specialist, Dr. Baird Cancer, who said she has a right choroidal met.  She will see him on 09/25/16.  She reports having hematuria before she saw Dr. Marko Plume and had a urine culture done which was negative.  She has not had any hematuria since then.  She denies having any dysuria or incontinence.  She denies having any bowel issues.  She denies having any vaginal/rectal bleeding. She reports having fatigue.  She has returned to work full time.  She is using a vaginal dilator.  BP (!) 162/61 (BP Location: Right Arm, Patient Position: Sitting)   Pulse 83   Temp 98.3 F (36.8 C) (Oral)   Ht 5' 4"  (1.626 m)   Wt 157 lb 6.4 oz (71.4 kg)   LMP 08/25/2015   SpO2 100%   BMI 27.02 kg/m    Wt Readings from Last 3 Encounters:  09/18/16 157 lb 6.4 oz (71.4 kg)  08/01/16 154 lb 4.8 oz (70 kg)  06/19/16 153 lb 14.4 oz (69.8 kg)

## 2016-09-18 NOTE — Progress Notes (Signed)
Radiation Oncology         [phone] ________________________________  Name: Valerie Figueroa MRN: [id number]  Date: 09/18/2016  DOB: 1956-01-11  Follow-Up Visit Note  CC: [name], MD  Nancy Marus, MD    ICD-9-CM ICD-10-CM   1. 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  11/09/15 - 12/21/15: Pelvis/paraaortic, 32.4 Gy in 18 fractions Pelvis/paraaortic boost, 5.4 Gy in 3 fractions Paraaortic boost, 12.6 Gy in 7 fractions  08/31/15 - 09/04/15: Pelvis 9 Gy in 3 fxs for initial vaginal bleeding issues  Narrative:  The patient returns today for routine follow-up. She denies having pain.  She reports she saw a Retinal Specialist, Dr. Baird Cancer, who said she might have a right choroidal metastasis. Patient denies any visual problems at this time.  She reports having hematuria before she saw Dr. Marko Plume and had a urine culture done which was negative. She has not had any hematuria since then. She denies dysuria, incontinence, bowel issues, or any vaginal/rectal bleeding. She reports having fatigue and has returned to work full time. She is using a vaginal dilator.  CT of the chest/abd/pelvis/head on 08/14/16 showed interval development of enlarged mediastinal lymph nodes worrisome for metastatic adenopathy, a new right lower lobe pulmonary nodule, and no evidence of recurrent adenopathy within the abdomen or pelvis. PET scan on 09/03/16 showed enlarged hypermetabolic mediastinal hilar lymph nodes consistent with metastatic adenopathy, an indeterminate hypermetabolic left axillary lymph node with normal morphology, low metabolic  activity of a larger right lower lobe pulmonary nodule favoring benign etiology (smaller right lobe nodules too small to characterize), a small right breast mass with biopsy clip and low metabolic activity not changed from prior, and no evidence of local recurrence or metastasis within the abdomen or pelvis.  The patient's last Pap smear on 06/19/16, by Dr. Alycia Rossetti, was negative for malignancy.  ALLERGIES:  is allergic to paclitaxel.  Meds: Current Outpatient Prescriptions  Medication Sig Dispense Refill  . acetaminophen (TYLENOL) 500 MG tablet Take 1,000 mg by mouth every 8 (eight) hours as needed for mild pain. Reported on 02/28/2016    . loratadine (CLARITIN) 10 MG tablet Take 10 mg by mouth daily. [employer]    . Multiple Vitamins-Minerals (CENTRUM ULTRA WOMENS) TABS Take 1 tablet by mouth every morning.    Marland Kitchen albuterol (PROVENTIL HFA;VENTOLIN HFA) 108 (90 BASE) MCG/ACT inhaler Inhale 2 puffs into the lungs every 6 (six) hours as needed for wheezing or shortness of breath. (Patient not taking: Reported on 09/18/2016) 1 Inhaler 2  . nicotine (NICODERM CQ - DOSED IN MG/24 HOURS) 14 mg/24hr patch Place 1 patch (14 mg total) onto the skin daily. (Patient not taking: Reported on 09/18/2016) 28 patch 0  . nicotine (NICODERM CQ - DOSED IN MG/24 HOURS) 21 mg/24hr patch Place 1 patch (21 mg total) onto the skin daily. Then apply 14 mg patch daily x weeks (Patient not taking: Reported on 09/18/2016) 28 patch 0  . polyethylene glycol (MIRALAX / GLYCOLAX) packet Take 17 g by mouth daily as needed (constipation). Reported on 06/19/2016    . traMADol (ULTRAM) 50 MG tablet Take 1 tablet (50 mg total) by mouth  every 8 (eight) hours as needed. (Patient not taking: Reported on 09/18/2016) 20 tablet 0   No current facility-administered medications for this encounter.     Physical Findings: The patient is in no acute distress. Patient is alert and oriented.  height is '5\' 4"'$  (1.626 m) and weight is 157 lb 6.4 oz  (71.4 kg). Her oral temperature is 98.3 F (36.8 C). Her blood pressure is 162/61 (abnormal) and her pulse is 83. Her oxygen saturation is 100%.   Lungs are clear to auscultation bilaterally. Heart has regular rate and rhythm. No palpable cervical, supraclavicular, or axillary adenopathy. No inguinal adenopathy.  On pelvic examination the external genitalia were unremarkable. A speculum exam was performed. The cervical os was noted to be flush with the upper vaginal region. Some radiation changes noted in the cervical area. No obvious tumor.  On bimanual and rectovaginal examination there were no pelvic masses appreciated.  Lab Findings: Lab Results  Component Value Date   WBC 7.1 09/05/2016   HGB 11.8 09/05/2016   HCT 35.0 09/05/2016   MCV 92.6 09/05/2016   PLT 256 09/05/2016    Radiographic Findings: Nm Pet Image Restag (ps) Skull Base To Thigh  Result Date: 09/03/2016 CLINICAL DATA:  Subsequent treatment strategy for cervical cancer. Pulmonary nodules EXAM: NUCLEAR MEDICINE PET SKULL BASE TO THIGH TECHNIQUE: 7.5 mCi F-18 FDG was injected intravenously. Full-ring PET imaging was performed from the skull base to thigh after the radiotracer. CT data was obtained and used for attenuation correction and anatomic localization. FASTING BLOOD GLUCOSE:  Value: 107 mg/dl COMPARISON:  CT 08/14/2016, PET-CT 09/06/2015 FINDINGS: NECK No hypermetabolic nodes in the neck. CHEST New enlarged hypermetabolic mediastinal lymph nodes. For example subcarinal lymph node measuring 20 mm short axis with SUV max equal 20.4. LEFT lower paratracheal lymph node measures 16 mm short axis with SUV max equal 14.4. High RIGHT paratracheal lymph node measuring 12 mm with intense metabolic activity. There is bilateral hilar metabolic activity. Intensely hypermetabolic LEFT axillary lymph node has normal morphology measuring 8 mm on image 50, series 4 but intense metabolic activity with SUV max equal 5.8. Review of lung windows  demonstrates a subpleural nodule in the RIGHT lower lobe measuring 9 mm (image 57, series 7) with low metabolic activity (SUV max equal 1.6). Small 3 mm RIGHT lower lobe nodule on image 39 is too small to characterize Within the RIGHT breast, small nodular lesion measuring 13 mm has low metabolic activity is (SUV max equaled 2.7 compared to 2.2 ). This lesion is not changed in size. Probable biopsy clip within this lesion ABDOMEN/PELVIS No abnormal hypermetabolic activity within the liver, pancreas, adrenal glands, or spleen. No hypermetabolic lymph nodes in the abdomen or pelvis. SKELETON Photopenia within the lumbar spine related to marrow suppression radiation treatment. No evidence skeletal metastasis. IMPRESSION: 1. Enlarged Hypermetabolic mediastinal hilar lymph nodes consistent with metastatic adenopathy. 2. Hypermetabolic LEFT axillary lymph node is indeterminate. Normal morphology. 3. Low metabolic activity of the larger RIGHT lower lobe pulmonary nodule favors benign etiology. Smaller RIGHT lobe nodules too small to characterize 4. Small RIGHT breast mass with biopsy clip and low metabolic activity not changed from prior. 5. No evidence of local recurrence or metastasis within the abdomen or pelvis. Electronically Signed   By: Suzy Bouchard M.D.   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  We will try to discuss her case at thoracic clinic to see if the patient is amenable to tissue biopsy of one of the mediastinal lymph nodes to confirm metastatic disease or confirm a second primary.  Plan: Final management pending thoracic clinic input. She is scheduled to follow up with Dr. Baird Cancer on 09/25/16 for an eye exam. She is scheduled to follow up with Dr. Marko Plume on 10/11/16 and Dr. Alycia Rossetti on 12/11/16. She will then follow up with radiation oncology in  March 2018. ____________________________________ -----------------------------------  Blair Promise, PhD, MD  This document serves as a record of services personally performed by Gery Pray, MD. It was created on his behalf by Darcus Austin, a trained medical scribe. The creation of this record is based on the scribe's personal observations and the provider's statements to them. This document has been checked and approved by the attending provider.

## 2016-09-18 NOTE — Patient Instructions (Signed)

## 2016-09-19 NOTE — Addendum Note (Signed)
Encounter addended by: Jacqulyn Liner, RN on: 09/19/2016  9:51 AM<BR>    Actions taken: Charge Capture section accepted

## 2016-09-25 ENCOUNTER — Telehealth: Payer: Self-pay

## 2016-09-25 ENCOUNTER — Telehealth: Payer: Self-pay | Admitting: Oncology

## 2016-09-25 ENCOUNTER — Telehealth: Payer: Self-pay | Admitting: *Deleted

## 2016-09-25 NOTE — Telephone Encounter (Signed)
Medical Oncology  Returned call to Dr Sherlynn Stalls, who saw patient in follow up of the choroidal abnormality, which is unchanged by his exam now, including by good measurements. He will follow. He does not think anything additional to be gained by MRI orbit now, that mentioned as possible further evaluation on the CT head report.  Unfortunatelyj, Dr Baird Cancer did not receive any of the scans or my office notes from 07-2016 or 08-2016, tho those were thought routed thru EMR.  I discussed workup with him now. Will fax scans and my notes to [phone] to his attention. I have asked HIM to be sure his contact information is correct in EMR.  Godfrey Pick, MD

## 2016-09-25 NOTE — Telephone Encounter (Signed)
Valerie Figueroa Signed [place] [name] 09/25/2016 3:14 PM  Telephone Encounter     '[]'$ Hide copied text Faxed records to Dr. Neita Carp ([id number] release id) per Dr. Marko Plume

## 2016-09-25 NOTE — Telephone Encounter (Signed)
-----   Message from Gordy Levan, MD sent at 09/25/2016  2:33 PM EDT ----- Please fax to attn Dr Sherlynn Stalls fax # [phone]  CTx 08-14-16  head chest abd pelvis PET 09-03-16  My note 9-14 My note 8-10  Please check that EMR has Dr Baird Cancer contact / fax correct as above, as he has received nothing from our office.  Please check with his office to be sure he receives the information today  Thank you Lennis

## 2016-09-25 NOTE — Telephone Encounter (Signed)
Faxed records to Dr. Neita Carp ([id number] release id) per Dr. Marko Plume

## 2016-09-25 NOTE — Telephone Encounter (Signed)
Spoke with Terri at Dr. Baird Cancer' office.  She did receive the faxed records today as noted below by Dr. Marko Plume. Fax and office numbers for Dr. Baird Cancer is correct in EMR.

## 2016-10-10 ENCOUNTER — Other Ambulatory Visit: Payer: Self-pay | Admitting: Oncology

## 2016-10-10 DIAGNOSIS — N6310 Unspecified lump in the right breast, unspecified quadrant: Secondary | ICD-10-CM

## 2016-10-11 ENCOUNTER — Telehealth: Payer: Self-pay | Admitting: Oncology

## 2016-10-11 NOTE — Telephone Encounter (Signed)
Left a message for Dacia requesting a return call.

## 2016-10-17 ENCOUNTER — Ambulatory Visit
Admission: RE | Admit: 2016-10-17 | Discharge: 2016-10-17 | Disposition: A | Discharge status: Home / Self Care | Payer: BLUE CROSS/BLUE SHIELD | Source: Ambulatory Visit | Attending: Oncology | Admitting: Oncology

## 2016-10-17 DIAGNOSIS — N6310 Unspecified lump in the right breast, unspecified quadrant: Secondary | ICD-10-CM

## 2016-10-21 ENCOUNTER — Other Ambulatory Visit: Payer: BLUE CROSS/BLUE SHIELD

## 2016-10-21 ENCOUNTER — Ambulatory Visit: Payer: BLUE CROSS/BLUE SHIELD | Admitting: Oncology

## 2016-11-16 ENCOUNTER — Other Ambulatory Visit: Payer: Self-pay | Admitting: Oncology

## 2016-11-18 ENCOUNTER — Ambulatory Visit (HOSPITAL_BASED_OUTPATIENT_CLINIC_OR_DEPARTMENT_OTHER): Payer: BLUE CROSS/BLUE SHIELD | Admitting: Oncology

## 2016-11-18 ENCOUNTER — Encounter: Payer: Self-pay | Admitting: Oncology

## 2016-11-18 ENCOUNTER — Other Ambulatory Visit (HOSPITAL_BASED_OUTPATIENT_CLINIC_OR_DEPARTMENT_OTHER): Payer: BLUE CROSS/BLUE SHIELD

## 2016-11-18 VITALS — BP 144/85 | HR 85 | Temp 98.2°F | Resp 18 | Ht 64.0 in | Wt 154.9 lb

## 2016-11-18 DIAGNOSIS — H538 Other visual disturbances: Secondary | ICD-10-CM | POA: Diagnosis not present

## 2016-11-18 DIAGNOSIS — R590 Localized enlarged lymph nodes: Secondary | ICD-10-CM

## 2016-11-18 DIAGNOSIS — C539 Malignant neoplasm of cervix uteri, unspecified: Secondary | ICD-10-CM

## 2016-11-18 DIAGNOSIS — Z720 Tobacco use: Secondary | ICD-10-CM

## 2016-11-18 LAB — CBC WITH DIFFERENTIAL/PLATELET
BASO%: 0.1 % (ref 0.0–2.0)
Basophils Absolute: 0 10*3/uL (ref 0.0–0.1)
EOS%: 1 % (ref 0.0–7.0)
Eosinophils Absolute: 0.1 10*3/uL (ref 0.0–0.5)
HCT: 32.5 % — ABNORMAL LOW (ref 34.8–46.6)
HEMOGLOBIN: 10.7 g/dL — AB (ref 11.6–15.9)
LYMPH%: 15.5 % (ref 14.0–49.7)
MCH: 29.6 pg (ref 25.1–34.0)
MCHC: 32.9 g/dL (ref 31.5–36.0)
MCV: 90 fL (ref 79.5–101.0)
MONO#: 0.8 10*3/uL (ref 0.1–0.9)
MONO%: 11.1 % (ref 0.0–14.0)
NEUT%: 72.3 % (ref 38.4–76.8)
NEUTROS ABS: 5.3 10*3/uL (ref 1.5–6.5)
Platelets: 237 10*3/uL (ref 145–400)
RBC: 3.61 10*6/uL — AB (ref 3.70–5.45)
RDW: 14 % (ref 11.2–14.5)
WBC: 7.3 10*3/uL (ref 3.9–10.3)
lymph#: 1.1 10*3/uL (ref 0.9–3.3)

## 2016-11-18 LAB — COMPREHENSIVE METABOLIC PANEL
ALBUMIN: 3.4 g/dL — AB (ref 3.5–5.0)
ALK PHOS: 129 U/L (ref 40–150)
ALT: 29 U/L (ref 0–55)
AST: 23 U/L (ref 5–34)
Anion Gap: 10 mEq/L (ref 3–11)
BILIRUBIN TOTAL: 0.25 mg/dL (ref 0.20–1.20)
BUN: 9.4 mg/dL (ref 7.0–26.0)
CO2: 23 meq/L (ref 22–29)
Calcium: 9.8 mg/dL (ref 8.4–10.4)
Chloride: 105 mEq/L (ref 98–109)
Creatinine: 0.7 mg/dL (ref 0.6–1.1)
GLUCOSE: 94 mg/dL (ref 70–140)
POTASSIUM: 3.9 meq/L (ref 3.5–5.1)
SODIUM: 137 meq/L (ref 136–145)
TOTAL PROTEIN: 7.5 g/dL (ref 6.4–8.3)

## 2016-11-18 NOTE — Progress Notes (Signed)
OFFICE PROGRESS NOTE   November 18, 2016   Physicians: Tomasita Crumble, Judye Bos [phone]).  Kelton Pillar (PCP)   INTERVAL HISTORY:  Patient is seen, alone for visit, in continuing attention to history of IIIB squamous cell carcinoma of cervix, on observation since treatment completed 01-2016.  Staging of the cervical cancer was not straightforward initially (see history below). She saw Dr Sondra Come [phone] and is to see Dr Alycia Rossetti [phone].  There is question by ophthalmology of right choroidal met, evaluated also with scans Aug and Sept 2017, not definitive. She had exam by Dr Baird Cancer of ophthalmology on 09-25-16 and will see him again in Jan; patient understands that exam was stable.  She is now established with Dr Kelton Pillar as PCP. She has made referral to [facility] for colonoscopy.  Patient continues to feel very well, with energy improved and is working full time tho she is tired by end of work day. Appetite is good, no abdominal or pelvic pain, bowels moving regularly. She sees occasional wavy lines with right eye; she had flashing light in right eye x1 on 10-23-16, no increase in floaters after that and no reoccurrence. She has had no further hematuria since one episode in Sept, UA negative for blood and culture negative after that.. No other bleeding. No LE swelling. Denies increased SOB or cough, has cut down to 2 cigarettes in past 24 hrs.  No chest pain. No new or different pain otherwise. No fever or symptoms of infection. No HA or other neurologic symptoms. Remainder of 10 point Review of Systems negative   Flu vaccine 09-18-16 No central catheter  ONCOLOGIC HISTORY Patient has had no regular medical care in years, including no PAP in >15 years. She had no bleeding since menopause until several hours of heavy vaginal bleeding in 04-2015, which resolved other than intermittent spotting until extremely heavy vaginal bleeding again 08-08-15. She was  seen in [facility] ED then, with Hgb 8.0; she was given premarin and continued on oral megace x 2 weeks. She was seen by Dr Mallory Shirk on 08-16-15 , with finding of large fungating cervical mass with parametrial involvement. Cervical biopsy 08-17-15 ([id number]) showed moderately to poorly differentiated squamous cell carcinoma. She had CT CAP in [facility] system on 08-21-15, with 1.7 cm oval mass upper outer right breast, multiple prominent mediastinal lymph nodes and multiiple bilateral pulmonary nodules including a 2.7 x 1.8 cm nodule in RLL., retroperitoneal and external iliac adenopathy, 4.2 x 5.2 x 6.1 cm mass involving cervix and lower uterine segment, 1 cm sclerotic lesion in lateral left fifth rib, liver ok and no ureteral obstruction. She saw Dr Alycia Rossetti on 08-23-15, who agreed with diagnosis of cervical cancer at least IIIB, possibly IV if pulmonary mets. PET pending, recommendation if stage IV to use short course RT to control bleeding then carbo taxol +/- avastin; if PET shows pelvis only then radiation with sensitizing CDDP. Patient saw Dr Sondra Come in consultation on 08-30-15, with vaginal packing required for heavy bleeding. She had urgent radiation thru 09-04-15. PET 09-06-15 had uptake in cervical mass, periaortic and iliac nodes, left supraclavicular node, left subpectoral and axillary nodes, and in right breast mass. Biposy of right breast 09-08-15 benign. Carboplatin taxol was attempted on 09-22-15, with acute, severe taxol reaction after ~ 2 ml taxol had infused, this despite full oral and IV premedication for taxol. Regimen was changed to CDDP gemzar, cycle 1 given 10-06-15 and cycle 2 on 10-19-15.  Case was reviewed at multidisciplinary conference, with repeat CT chest favoring reactive nodes over metastatic disease. Decision was made to give definitive radiation with sensitizing CDDP. She had 5 weekly cycles of CDDP from 11-02-15 thru 12-04-15 with IMRT. HDR, completed 01-25-16. CT AP 03-21-16 with  resolution of cervical mass and of abdominal and pelvic adenopathy. Ophthalmlogy exam 07-2016 question of right choroidal met. CT head, CAP 08-14-16 and PET 09-03-16 done for restaging with slight increase in size 3 paratracheal/ mediastinal nodes up to 1.6 cm with hypermetabolic uptake there and right axillary nodes, mm size subpleural nodules, same right breast nodule that was benign by biopsy, nothing found CT head, nothing abdomen or pelvis.      Objective:  Vital signs in last 24 hours: Weight down 2 lbs to 154 lb 14 oz. BMI 26.6. 144/85, HR 85 regular, resp 18 not labored, 98.2, 99% Looks best that I have seen her. Alert, oriented and appropriate. Ambulatory without difficulty, looks entirely comfortable, very pleasant as always.    HEENT:PERRL, sclerae not icteric, EOMI. Oral mucosa moist without lesions, posterior pharynx clear.  Neck supple. No JVD.  Lymphatics:no cervical,supraclavicular, axillary or inguinal adenopathy appreciated. Resp: Respirations not labored. Somewhat decreased BS thruout as baseline, otherwise clear to auscultation bilaterally and no dullness to percussion bilaterally Cardio: regular rate and rhythm. No gallop. GI: abdomen soft, nontender, not distended, no mass or organomegaly. Normally active bowel sounds. Musculoskeletal/ Extremities: UE / LE without pitting edema, cords, tenderness Neuro: speech fluent, motor/ sensory/ cerebellar otherwise nonfocal. PSYCH appropriate mood and affect Skin without rash, ecchymosis, petechiae Breasts: bilateral without dominant mass, skin or nipple findings. Axillae benign.   Lab Results:  Results for orders placed or performed in visit on 11/18/16  CBC with Differential  Result Value Ref Range   WBC 7.3 3.9 - 10.3 10e3/uL   NEUT# 5.3 1.5 - 6.5 10e3/uL   HGB 10.7 (L) 11.6 - 15.9 g/dL   HCT 32.5 (L) 34.8 - 46.6 %   Platelets 237 145 - 400 10e3/uL   MCV 90.0 79.5 - 101.0 fL   MCH 29.6 25.1 - 34.0 pg   MCHC 32.9 31.5 -  36.0 g/dL   RBC 3.61 (L) 3.70 - 5.45 10e6/uL   RDW 14.0 11.2 - 14.5 %   lymph# 1.1 0.9 - 3.3 10e3/uL   MONO# 0.8 0.1 - 0.9 10e3/uL   Eosinophils Absolute 0.1 0.0 - 0.5 10e3/uL   Basophils Absolute 0.0 0.0 - 0.1 10e3/uL   NEUT% 72.3 38.4 - 76.8 %   LYMPH% 15.5 14.0 - 49.7 %   MONO% 11.1 0.0 - 14.0 %   EOS% 1.0 0.0 - 7.0 %   BASO% 0.1 0.0 - 2.0 %   CMET available after visit normal with exception of albumin 3.4 including normal renal function, LFTs, calcium  Studies/Results: 6 month follow up mammograms bilateral tomo at [facility] 10-17-16  "benign 2, bilateral follow up mammogram in 1 year", that information from "Edited Result" in this EMR imaging section.  Last body imaging was PET 09-03-16 Also CT head/ CAP in 8-201.  Medications: I have reviewed the patient's current medications. Nicotine gum suggested  DISCUSSION Clinically doing very well, with nothing of obvious concern other than slight vision changes. She understands that it has not seemed appropriate to resume chemotherapy unless definitive findings of active malignancy. Cannot get tissue from choroidal area questioned. No recommendations from thoracic conference that I can locate. May be best to  repeat scans and consider other biopsy,  tho timing prior to end of year would not be easy for her.  She should contact Dr Baird Cancer if other changes in vision prior to next appointment in Jan.   Smoking cessation encouraged. Suggested nicotine gum as easier to titrate to effect, as nicotine patches made her jittery.   Assessment/Plan:  1. Squamous cell carcinoma cervix: moderately to poorly differentiated, initially thought metastic to paraaortic, chest and left supraclavicular nodes. Bleeding initially resolved with short course palliative radiation, then began chemotherapy. After anaphylaxis to taxol, regimen changed to CDDP gemzar, given 10-06-15 and 10-19-15. Repeat chest imaging 10-24-15 did not suggest metastatic disease,  such that she proceeded with definitive radiation given with sensitizing CDDP x 5 cycles from 11-10 thru 12-04-15 (counts too low for weekly cycle 6 CDDP). IMRT and HDR completed early 01-2016, restaging then without evidence of disease.  Follow up with gyn onc in Dec as scheduled, and Dr Sondra Come as scheduled.  2. 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 We understand this was stable at last follow up, Dr Baird Cancer to see again in Jan. 3. PET 08-2016 with uptake in mediastinal and hilar nodes: has been complicated picture on scans previously also. Expect will need repeat scan, possibly CT chest, 4-6 months unless other MDs have different recommendations. 4.gross hematuria x 1 in Sept: UA, C&S afterwards not remarkable. CT AP 07-2016 and PET 08-2016 nothing urologic obvious.  Would need urology evaluation if reoccurs. 5..right breast mass upper outer quadrant: biopsy with benign findings, 6 month follow up diagnostic mammogram + [place] 03-11-16 stable appears benign, 6 month bilateral mammograms also unremarkable. Bilateral next 09-2017. 6.long tobacco, smoking cessation encouraged, patient now down to 2 cigarettes/ 24 hours. May to best with nicotine gum  7.acute anaphylactic reaction to taxol 09-22-15 despite full premedication 8..iron deficiency anemia by iron studies at presentation with cervical cancer with heavy vaginal bleeding: Transfused and IV feraheme then. Hemoglobin stable. 9.sclerotic rib lesion on CT no correlate on PET. CAD by CT, cholelithiasis by CT,  10. Colonoscopy planned by PCP with [facility] 10.peripheral venous access adequate for chemo previously 11 Social:.Lives in [place], but has wanted treatment in [place]. Social situation difficult as husband has been in prison for last 8 months for MVA and that she has long commutes daily for work. 12. flu vaccine 09-18-16  All questions answered and she knows to contact MD if concerns  prior to next scheduled appointment. Time spent 25 min including >50% counseling and coordination of care.    Evlyn Clines, MD   11/18/2016, 9:17 AM

## 2016-11-19 IMAGING — CT CT ABD-PELV W/ CM
2 of 5 series · 16 of 46 positions shown, 18 images · IV contrast (OMNIPAQUE)
Comparison: 08/21/2015 and 09/06/2015

CLINICAL DATA: Cervical cancer.  Status post chemotherapy.

EXAM:
CT ABDOMEN AND PELVIS WITH CONTRAST
TECHNIQUE: Multidetector CT imaging of the abdomen and pelvis was performed
using the standard protocol following bolus administration of
intravenous contrast.
CONTRAST:  100mL XO0ZB9-OLL IOPAMIDOL (XO0ZB9-OLL) INJECTION 61%

[Series 2: rtn a/p with · axial · 0.77mm/px · z∈[-504,-114]mm · 13 of 88 slices shown, 15 images]
[im 5/88  soft-tissue]
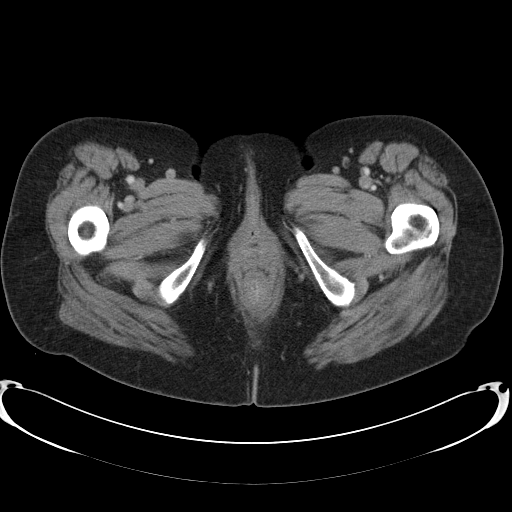
[im 5/88  bone]
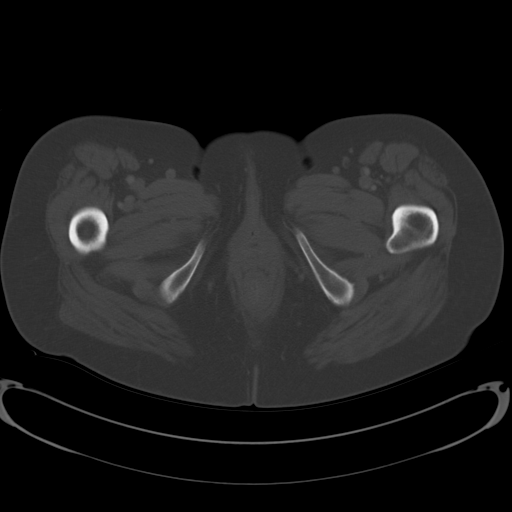
[im 14/88  soft-tissue]
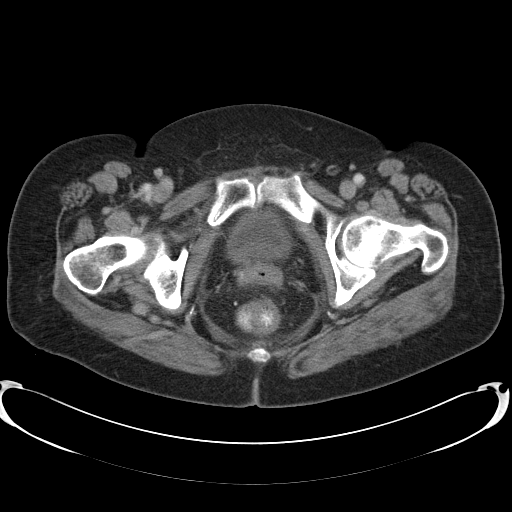
[im 19/88  soft-tissue]
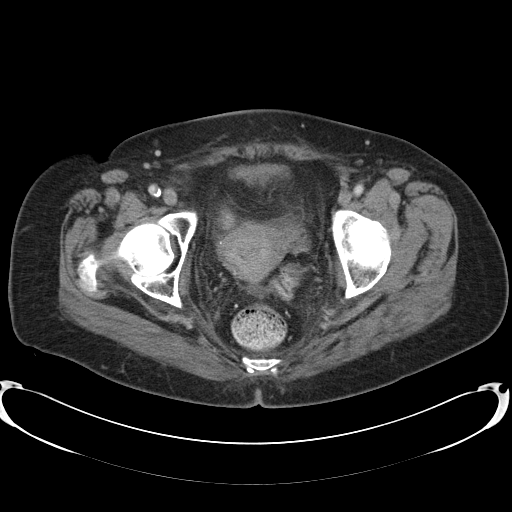
[im 23/88  soft-tissue]
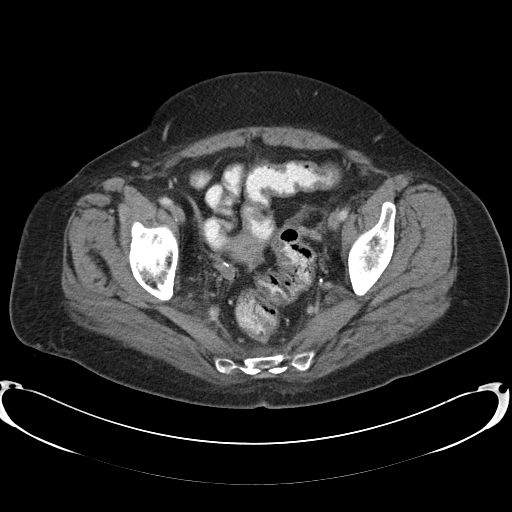
[im 33/88  soft-tissue]
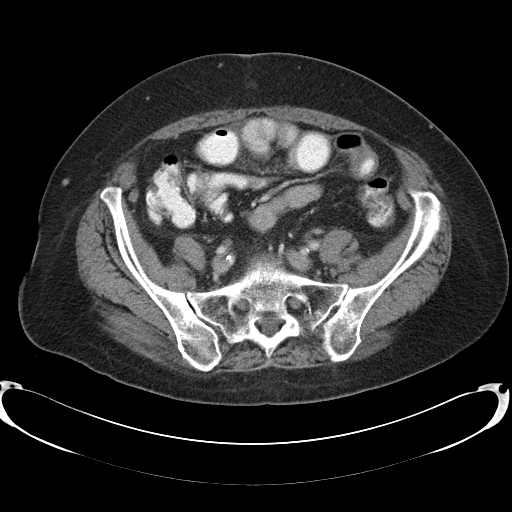
[im 37/88  soft-tissue]
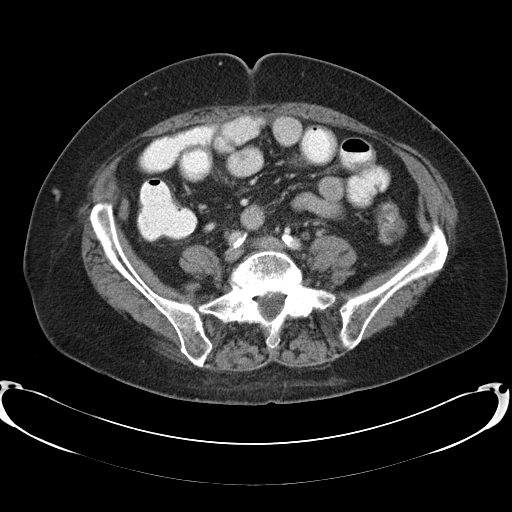
[im 46/88  soft-tissue]
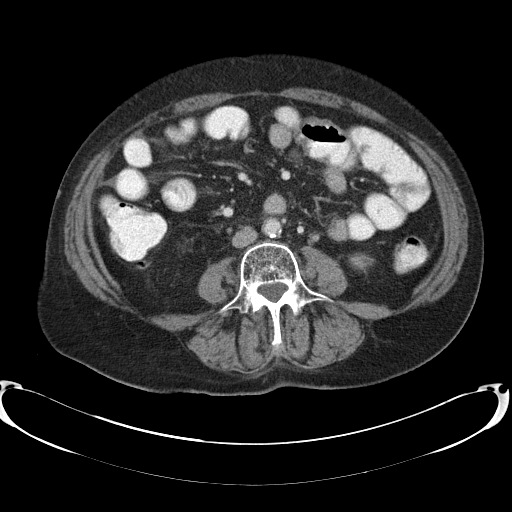
[im 51/88  soft-tissue]
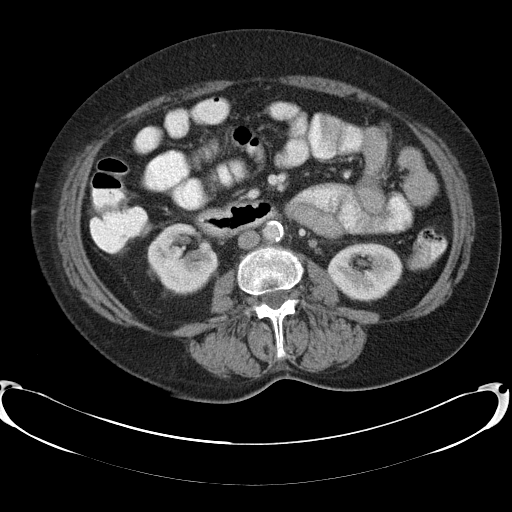
[im 55/88  soft-tissue]
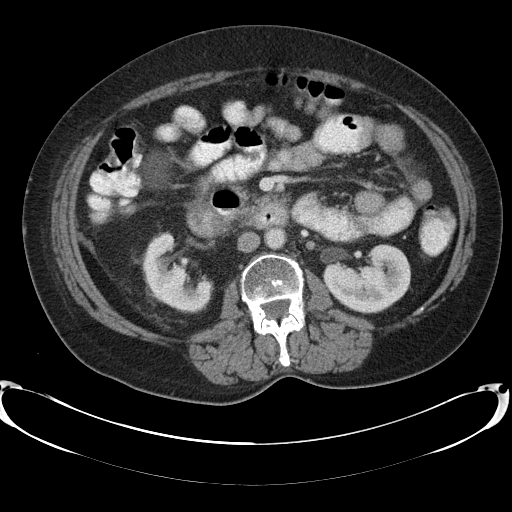
[im 55/88  bone]
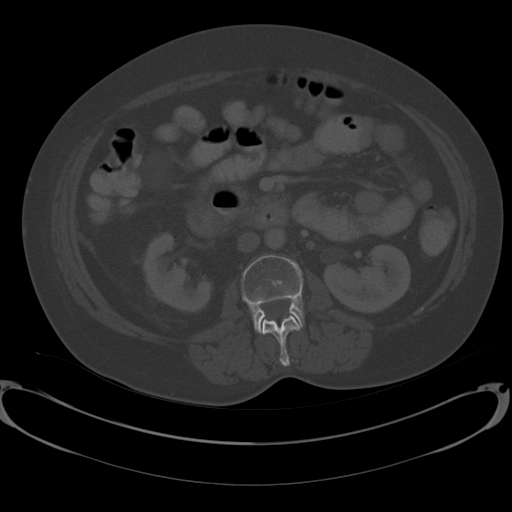
[im 65/88  soft-tissue]
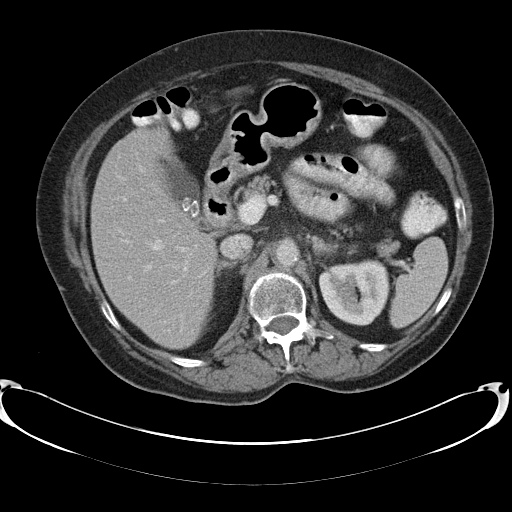
[im 69/88  soft-tissue]
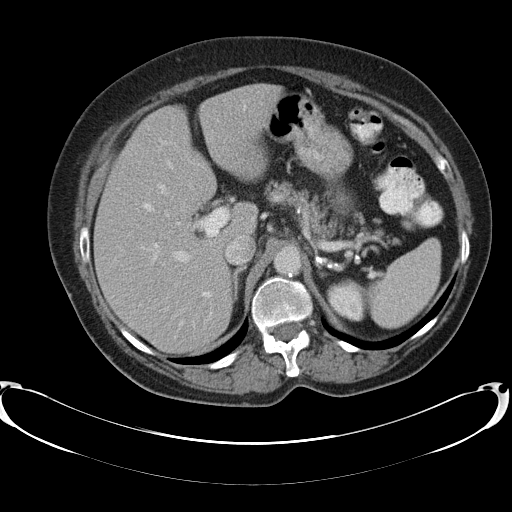
[im 74/88  soft-tissue]
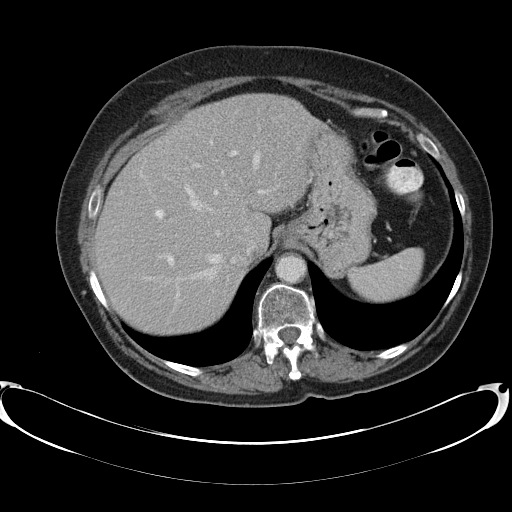
[im 83/88  soft-tissue]
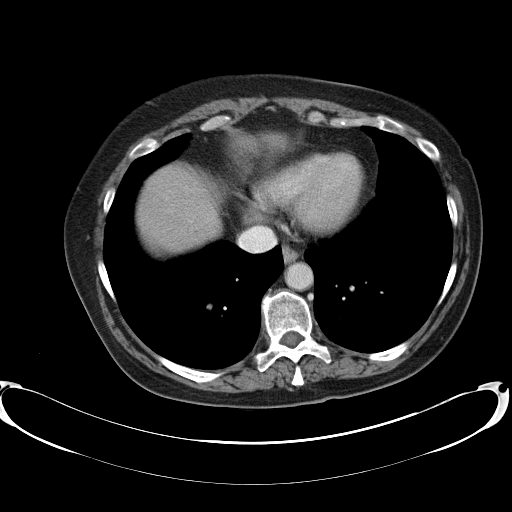

[Series 602: <mpr thick range> · coronal · 0.86mm/px · 3 of 86 slices shown]
[im 29/86  soft-tissue]
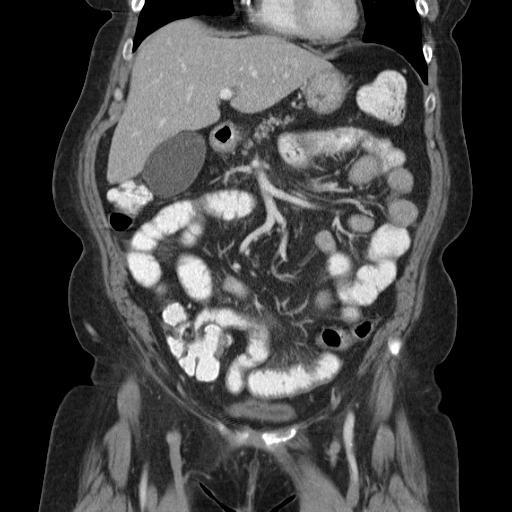
[im 38/86  soft-tissue]
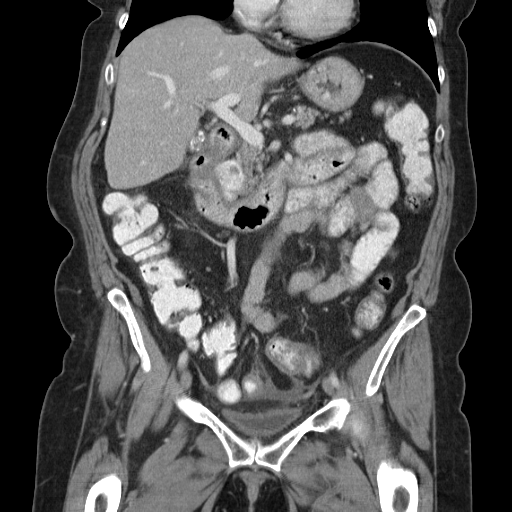
[im 48/86  soft-tissue]
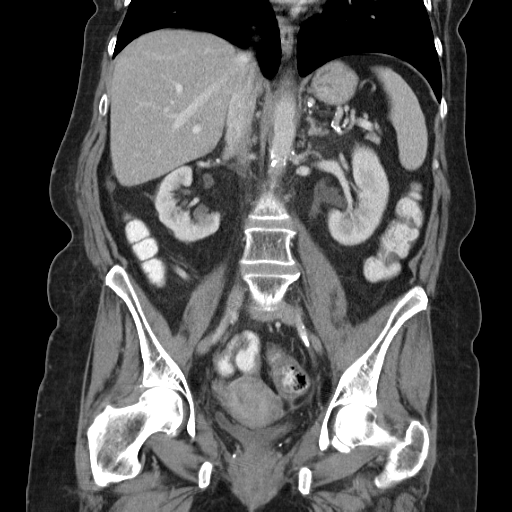

[16 of 46 positions shown; findings below may reference images not displayed]

FINDINGS: Lower chest: There is no pleural effusion. Scarring is noted within
the right base.

Hepatobiliary: No focal liver abnormality identified. Stones are
noted within the gallbladder. These measure up to 11 mm. No
gallbladder wall thickening or biliary dilatation.

Pancreas: Normal appearance of the pancreas.

Spleen: Normal size.  No focal abnormality.

Adrenals/Urinary Tract: The adrenal glands are unremarkable. No
kidney stones identified. There is scarring and cortical volume loss
involving the right kidney. The left kidney is normal. The urinary
bladder is unremarkable.

Stomach/Bowel: The stomach appears normal. The small bowel loops
have a normal course and caliber. Mild wall thickening involving the
pelvic large and small bowel loops are noted which may reflect
changes secondary to external beam radiation.

Vascular/Lymphatic: Calcified atherosclerotic disease involves the
abdominal aorta. No aneurysm. Resolution of previous index
periaortic adenopathy. No new or enlarging upper abdominal lymph
nodes. There is no pelvic or inguinal adenopathy identified.

Reproductive: Resolution of previous cervical mass. No adnexal mass
identified at this time.

Other: There is set trace ascites identified along the right
pericolic gutter and in the dependent portion of the pelvis. No
peritoneal nodularity identified.

Musculoskeletal: No aggressive lytic or sclerotic bone lesions
identified. Spondylosis noted within the L5-S1 level.
IMPRESSION: 1. Interval response to therapy. There has been resolution of
previous surgical mass. Abdominal and pelvic adenopathy has also
resolved in the interval.
2. Residual trace ascites identified within the right pericolic
gutter an dependent portion of pelvis.
3. Aortic atherosclerosis.
4. Gallstones.

## 2016-12-10 ENCOUNTER — Telehealth: Payer: Self-pay | Admitting: Nurse Practitioner

## 2016-12-10 NOTE — Telephone Encounter (Signed)
Per Lenna Sciara, NP, patient rescheduled from 12/11/16 to 01/08/17 with Dr. Alycia Rossetti. Attempted to schedule patient on 1/3 but she has to work. Patient did not voice any complaints or questions. Aware of apt on 01/08/17 at 2:30.

## 2016-12-11 ENCOUNTER — Ambulatory Visit: Payer: BLUE CROSS/BLUE SHIELD | Admitting: Gynecologic Oncology

## 2017-01-08 ENCOUNTER — Ambulatory Visit: Payer: BLUE CROSS/BLUE SHIELD | Admitting: Gynecologic Oncology

## 2017-01-23 NOTE — Progress Notes (Signed)
Consult Note: Gyn-Onc  [name] 61 y.o. female  CC:  Chief Complaint  Patient presents with  . Sq[place] Cell Carcinoma of the Cervix    HPI: Patient was seen in consultation at the request of Dr. Mallory Shirk for newly diagnosed cervical cancer.  Patient is a 24 gravida 3 para 3 who was in her usual state of health until May of this year. At that time she began having some vaginal bleeding which stopped spontaneously. She then presented to her local emergency room in August with heavy vaginal bleeding. She was given IV Premarin as well as discharged on oral Premarin. That did help decrease her bleeding. She had a cervical biopsy performed on August 24 that revealed squamous cell carcinoma. She was seen by Dr. Glo Herring in the office and had a large fungating cervical mass with at least parametrial involvement appreciated.  A CT scan of the chest, abdomen and pelvis was performed on August 29. This revealed: CT CHEST FINDINGS  Mediastinum/Nodes: There is a 1.7 cm oval mass within the upper-outer quadrant of the right breast. Visualized thyroid is unremarkable. No enlarged axillary mediastinal or hilar lymphadenopathy. Multiple prominent mediastinal lymph nodes. Normal heart size. Coronary arterial vascular calcifications. No pericardial effusion.  Lungs/Pleura: Central airways are patent. Multiple bilateral pulmonary nodules are demonstrated including a 3 mm right lower lobe pulmonary nodule (image 40; series 6) ; a 2 mm right lower lobe nodule (image 40; series 6) a 2 mm left upper lobe pulmonary nodule (image 27; series 6) and a 1.3 cm nodular ground-glass area within the superior left lower lobe (image 29; series 6). Additional small scattered pulmonary nodules are demonstrated. Additionally within the right lower lobe there is a 2.7 x 1.8 cm irregular nodular opacity (image 47; series 6). No pleural effusion or pneumothorax.  CT ABDOMEN AND PELVIS  FINDINGS Vascular/Lymphatic: Normal caliber abdominal aorta. Multiple prominent and enlarged retroperitoneal lymph nodes are demonstrated including a 13 mm left periaortic lymph node (image 71; series 2) and a 0.9 cm right common iliac lymph node (image 89; series 2). There is a 13 mm right external iliac lymph node (image 102; series 2) and a 0.8 cm left external iliac lymph node (image 109; series 2).  Other: There is a 4.2 x 5.2 x 6.1 cm irregular thick-walled mass involving the cervix and lower uterine segment (image 111; series 2). There is surrounding fat stranding. There is dilatation of the  endometrium.  Musculoskeletal: 10 mm sclerotic focus within the lateral left fifth rib (image 25; series 2).  IMPRESSION: Large irregular mass involving the cervix and lower uterine segment compatible with recently biopsied cervical malignancy. There is associated dilatation of the endometrial canal. Multiple enlarged retroperitoneal and pelvic lymph nodes concerning for metastatic adenopathy.  Abnormal enhancement of the urothelium involving the right renal collecting system and proximal right ureter raising the possibility of an infectious process. Recommend correlation with urinalysis. Posterior to the right kidney there is irregular soft tissue density material which may represent a small amount of infectious/inflammatory stranding of the perinephric fat.  Irregular nodular opacity within the right lower lobe measuring up to 2.7 cm may be secondary to an infectious or inflammatory process however is concerning for metastatic disease in the setting of known cervical malignancy or possible primary pulmonary malignancy.  Multiple additional 2-3 mm nodules within the lungs bilaterally may be infectious or inflammatory in etiology or potentially secondary to metastatic disease. 10 mm sclerotic focus within the lateral left fifth rib  may represent a bone island or osseous metastasis.  Oval mass  within the upper-outer quadrant of the right breast. Recommend dedicated evaluation with mammography if not previously evaluated.  09/06/15 PET: IMPRESSION: 1. Hypermetabolic cervical mass with hypermetabolic abdominal and peritoneal retroperitoneal adenopathy, consistent with the given history of cervical carcinoma. 2. Hypermetabolic left supraclavicular and left axillary lymph nodes. Left axillary lymph nodes are new from 08/21/2015, suggesting an infectious or inflammatory etiology. Attention on followup exams is warranted. 3. Mildly hypermetabolic lateral right breast nodule. Patient underwent mammography and ultrasound on 09/01/2015. 4. Coronary artery calcification. 5. Cholelithiasis.  FNA Breast 09/08/15: Breast, right, needle core biopsy, upper outer quadrant at 10:30 o'clock - FIBROADENOMA WITH CALCIFICATIONS. - THERE IS NO EVIDENCE OF MALIGNANCY.  She completed all of her radiation therapy with her last HDR on January 25, 2016. She's been feeling very well since she finished her radiation.  CT 3/17: IMPRESSION: 1. Interval response to therapy. There has been resolution of previous surgical mass. Abdominal and pelvic adenopathy has also resolved in the interval. 2. Residual trace ascites identified within the right pericolic gutter an dependent portion of pelvis. 3. Aortic atherosclerosis. 4. Gallstones.  Interval History: Ophthalmlogy exam 07-2016 question of right choroidal met. CT head, CAP 08-14-16 and PET 09-03-16 done for restaging with slight increase in size 3 paratracheal/ mediastinal nodes up to 1.6 cm with hypermetabolic uptake there and right axillary nodes, mm size subpleural nodules, same right breast nodule that was benign by biopsy, nothing found CT head, nothing abdomen or pelvis.  She has no complaints today other than a recent virus causing cough and malaise and anorexia. She has lost 14lbs since last visit.    Review of Systems  Constitutional: Denies fever, good  appetite Skin: No rash Cardiovascular: No chest pain, shortness of breath, or edema  Pulmonary:No cough  Gastro Intestinal:   No nausea, vomiting, constipation, or diarrhea reported. No bright red blood per rectum or change in bowel movement.  Genitourinary: NO vaginal bleeding  Musculoskeletal: No myalgia, arthralgia, joint swelling or pain.  Neurologic: As above  Current Meds:  Outpatient Encounter Prescriptions as of 01/24/2017  Medication Sig  . acetaminophen (TYLENOL) 500 MG tablet Take 1,000 mg by mouth every 8 (eight) hours as needed for mild pain. Reported on 02/28/2016  . Multiple Vitamins-Minerals (CENTRUM ULTRA WOMENS) TABS Take 1 tablet by mouth every morning.  Marland Kitchen Phenylephrine-APAP-Guaifenesin (MUCINEX FAST-MAX) 10-650-400 MG/20ML LIQD Take by mouth as directed.  . polyethylene glycol (MIRALAX / GLYCOLAX) packet Take 17 g by mouth daily as needed (constipation). Reported on 06/19/2016  . [DISCONTINUED] albuterol (PROVENTIL HFA;VENTOLIN HFA) 108 (90 BASE) MCG/ACT inhaler Inhale 2 puffs into the lungs every 6 (six) hours as needed for wheezing or shortness of breath. (Patient not taking: Reported on 11/18/2016)  . [DISCONTINUED] loratadine (CLARITIN) 10 MG tablet Take 10 mg by mouth daily. [employer]  . [DISCONTINUED] traMADol (ULTRAM) 50 MG tablet Take 1 tablet (50 mg total) by mouth every 8 (eight) hours as needed. (Patient not taking: Reported on 11/18/2016)   No facility-administered encounter medications on file as of 01/24/2017.     Allergy:  Allergies  Allergen Reactions  . Paclitaxel Anaphylaxis    Taxol    Social Hx:   Social History   Social History  . Marital status: Married    Spouse name: N/A  . Number of children: 3  . Years of education: N/A   Occupational History  . [employer]    Social History  Main Topics  . Smoking status: Current Every Day Smoker    Packs/day: 0.25    Years: 30.00    Types: Cigarettes  . Smokeless tobacco: Never Used   . Alcohol use No  . Drug use: No  . Sexual activity: Not Currently    Birth control/ protection: Surgical   Other Topics Concern  . Not on file   Social History Narrative  . No narrative on file    Past Surgical Hx:  Past Surgical History:  Procedure Laterality Date  . CARPAL TUNNEL RELEASE Bilateral   . OVARIAN CYST REMOVAL Left 01-17-1985  . TANDEM RING INSERTION N/A 12/26/2015   Procedure: TANDEM RING INSERTION;  Surgeon: Gery Pray, MD;  Location: [facility];  Service: Urology;  Laterality: N/A;  . TANDEM RING INSERTION N/A 01/04/2016   Procedure: TANDEM RING INSERTION;  Surgeon: Gery Pray, MD;  [facility];  Service: Urology;  Laterality: N/A;  . TANDEM RING INSERTION N/A 01/11/2016   Procedure: TANDEM RING INSERTION;  Surgeon: Gery Pray, MD;  Location: [facility];  Service: Urology;  Laterality: N/A;  . TANDEM RING INSERTION N/A 01/17/2016   Procedure: TANDEM RING INSERTION;  Surgeon: Gery Pray, MD;  Location: [facility];  Service: Urology;  Laterality: N/A;  . TANDEM RING INSERTION N/A 01/25/2016   Procedure: TANDEM RING INSERTION/ RING FOR HIGH DOSE RATE RADIATIONTHERAPY;  Surgeon: Gery Pray, MD;  Location[facility];  Service: Urology;  Laterality: N/A;  . TUBAL LIGATION      Past Medical Hx:  Past Medical History:  Diagnosis Date  . Cancer ([place])    cervical ca 2016  . Cervical cancer, FIGO stage IIIB [facility]) oncologist-  dr Marko Plume /  dr Sondra Come   Squamous Cell Carcinoma--  s/p chemotherapy and  pelvic external radiation 08-31-2015 to 09-04-2015/  currently getting high dose radaition thru [facility]  . Heart murmur    age 15 not followed by a cardiologist   . History of postmenopausal bleeding   . Iron deficiency anemia   . Radiation 08/31/15-09/04/15   pelvis 9 Gy  . Radiation 12/26/15, 01/04/16, 01/11/16, 01/17/16, 01/25/16   brachytherapy tandem and ring to cervical area     Oncology Hx:     Cervix cancer ([place]) (Resolved)   08/16/2015 Initial Diagnosis    Cervix cancer       Malignant neoplasm of cervix ([place])   08/23/2015 Initial Diagnosis    Malignant neoplasm of cervix ([place]). IIIB       - 01/25/2016 Radiation Therapy    external beam with cddp and 5 brachytherapy       Family Hx:  Family History  Problem Relation Age of Onset  . Cancer Father     lung    Vitals:  Blood pressure (!) 167/73, pulse 95, temperature 97.8 F (36.6 C), temperature source Oral, resp. rate 18, height 5' 4"  (1.626 m), weight 140 lb 1.6 oz (63.5 kg), last menstrual period 08/25/2015, SpO2 98 %.  Physical Exam:  Well-nourished, well-developed female in no acute distress.  Neck: Supple, no lymphadenopathy no thyromegaly.  Lungs: Inspiratory wheezes. Otherwise clear to auscultation.  Cardiac: Regular rate and rhythm.  Abdomen: Well-healed transverse incision. Abdomen is soft, nontender, nondistended. There are no palpable masses or hepatosplenomegaly.  Groins: No lymphadenopathy.  Extremities: No edema.  Neuro: Symmetrical sensory on bilateral lower extremities.  Pelvic: External genitalia within normal limits. Speculum examination reveals a dramatic response. The cervix is very flush  with the upper vagina and deviated towards the patient's right with some agglutination. There are no visible lesion. There are no mucosal irregularities. Pap smear submitted without difficulty On bimanual examination the cervix is not normal however there is no nodularity. The cervix feels tethered towards the patient's left. On rectovaginal examination there is fibrosis and postradiation changes but there is no nodularity.  Assessment/Plan: 61 year old with history of clinical IIIB squamous cell carcinoma cervix. There was concern for stage IV disease but the PET scan did not show that. Suspicious neck nodes on September 2017 PET.  Repeat PET to evaluate for progression vs stable disease.  Follow-up  with Dr. Sondra Come and Dr Alvy Bimler in March, 2018 and Dr Alycia Rossetti in June, 2018.   Donaciano Eva, MD 01/24/2017, 4:18 PM

## 2017-01-24 ENCOUNTER — Encounter: Payer: Self-pay | Admitting: Gynecologic Oncology

## 2017-01-24 ENCOUNTER — Ambulatory Visit: Payer: BLUE CROSS/BLUE SHIELD | Attending: Gynecologic Oncology | Admitting: Gynecologic Oncology

## 2017-01-24 VITALS — BP 167/73 | HR 95 | Temp 97.8°F | Resp 18 | Ht 64.0 in | Wt 140.1 lb

## 2017-01-24 DIAGNOSIS — K8020 Calculus of gallbladder without cholecystitis without obstruction: Secondary | ICD-10-CM | POA: Diagnosis not present

## 2017-01-24 DIAGNOSIS — R918 Other nonspecific abnormal finding of lung field: Secondary | ICD-10-CM | POA: Diagnosis not present

## 2017-01-24 DIAGNOSIS — I2510 Atherosclerotic heart disease of native coronary artery without angina pectoris: Secondary | ICD-10-CM | POA: Diagnosis not present

## 2017-01-24 DIAGNOSIS — C539 Malignant neoplasm of cervix uteri, unspecified: Secondary | ICD-10-CM

## 2017-01-24 DIAGNOSIS — N6311 Unspecified lump in the right breast, upper outer quadrant: Secondary | ICD-10-CM | POA: Insufficient documentation

## 2017-01-24 DIAGNOSIS — R590 Localized enlarged lymph nodes: Secondary | ICD-10-CM | POA: Diagnosis not present

## 2017-01-24 DIAGNOSIS — F17210 Nicotine dependence, cigarettes, uncomplicated: Secondary | ICD-10-CM | POA: Insufficient documentation

## 2017-01-24 NOTE — Patient Instructions (Signed)
Follow up with Dr Alycia Rossetti in June. Our office will contact you with an appointment for the PET scan, once prior authorization has been obtained from your insurance company.

## 2017-01-27 ENCOUNTER — Other Ambulatory Visit: Payer: Self-pay

## 2017-01-27 ENCOUNTER — Emergency Department (HOSPITAL_COMMUNITY)
Admission: EM | Admit: 2017-01-27 | Discharge: 2017-01-27 | Disposition: A | Discharge status: Home / Self Care | Payer: BLUE CROSS/BLUE SHIELD | Attending: Emergency Medicine | Admitting: Emergency Medicine

## 2017-01-27 ENCOUNTER — Emergency Department (HOSPITAL_COMMUNITY): Payer: BLUE CROSS/BLUE SHIELD

## 2017-01-27 ENCOUNTER — Encounter (HOSPITAL_COMMUNITY): Payer: Self-pay | Admitting: Emergency Medicine

## 2017-01-27 DIAGNOSIS — Z8541 Personal history of malignant neoplasm of cervix uteri: Secondary | ICD-10-CM | POA: Diagnosis not present

## 2017-01-27 DIAGNOSIS — R918 Other nonspecific abnormal finding of lung field: Secondary | ICD-10-CM | POA: Diagnosis not present

## 2017-01-27 DIAGNOSIS — F17210 Nicotine dependence, cigarettes, uncomplicated: Secondary | ICD-10-CM | POA: Diagnosis not present

## 2017-01-27 DIAGNOSIS — B9789 Other viral agents as the cause of diseases classified elsewhere: Secondary | ICD-10-CM

## 2017-01-27 DIAGNOSIS — J069 Acute upper respiratory infection, unspecified: Secondary | ICD-10-CM | POA: Diagnosis not present

## 2017-01-27 DIAGNOSIS — Z79899 Other long term (current) drug therapy: Secondary | ICD-10-CM | POA: Insufficient documentation

## 2017-01-27 DIAGNOSIS — R05 Cough: Secondary | ICD-10-CM | POA: Diagnosis present

## 2017-01-27 LAB — CBC WITH DIFFERENTIAL/PLATELET
BASOS ABS: 0 10*3/uL (ref 0.0–0.1)
Basophils Relative: 0 %
EOS ABS: 0.1 10*3/uL (ref 0.0–0.7)
EOS PCT: 1 %
HCT: 32.1 % — ABNORMAL LOW (ref 36.0–46.0)
Hemoglobin: 10.2 g/dL — ABNORMAL LOW (ref 12.0–15.0)
LYMPHS ABS: 1.3 10*3/uL (ref 0.7–4.0)
LYMPHS PCT: 13 %
MCH: 26 pg (ref 26.0–34.0)
MCHC: 31.8 g/dL (ref 30.0–36.0)
MCV: 81.9 fL (ref 78.0–100.0)
Monocytes Absolute: 1 10*3/uL (ref 0.1–1.0)
Monocytes Relative: 10 %
NEUTROS PCT: 76 %
Neutro Abs: 8.3 10*3/uL — ABNORMAL HIGH (ref 1.7–7.7)
PLATELETS: 362 10*3/uL (ref 150–400)
RBC: 3.92 MIL/uL (ref 3.87–5.11)
RDW: 15 % (ref 11.5–15.5)
WBC: 10.8 10*3/uL — AB (ref 4.0–10.5)

## 2017-01-27 LAB — I-STAT TROPONIN, ED: TROPONIN I, POC: 0 ng/mL (ref 0.00–0.08)

## 2017-01-27 LAB — I-STAT CHEM 8, ED
BUN: 7 mg/dL (ref 6–20)
CREATININE: 0.5 mg/dL (ref 0.44–1.00)
Calcium, Ion: 1.18 mmol/L (ref 1.15–1.40)
Chloride: 104 mmol/L (ref 101–111)
Glucose, Bld: 97 mg/dL (ref 65–99)
HEMATOCRIT: 30 % — AB (ref 36.0–46.0)
HEMOGLOBIN: 10.2 g/dL — AB (ref 12.0–15.0)
Potassium: 3.7 mmol/L (ref 3.5–5.1)
SODIUM: 136 mmol/L (ref 135–145)
TCO2: 21 mmol/L (ref 0–100)

## 2017-01-27 LAB — D-DIMER, QUANTITATIVE: D-Dimer, Quant: 1.72 ug/mL-FEU — ABNORMAL HIGH (ref 0.00–0.50)

## 2017-01-27 MED ORDER — SODIUM CHLORIDE 0.9 % IJ SOLN
INTRAMUSCULAR | Status: AC
  Filled 2017-01-27: qty 50

## 2017-01-27 MED ORDER — ALBUTEROL SULFATE HFA 108 (90 BASE) MCG/ACT IN AERS
2.0000 | INHALATION_SPRAY | Freq: Once | RESPIRATORY_TRACT | Status: AC
  Administered 2017-01-27: 2 via RESPIRATORY_TRACT
  Filled 2017-01-27: qty 6.7

## 2017-01-27 MED ORDER — BENZONATATE 100 MG PO CAPS: 100.0000 mg | ORAL_CAPSULE | Freq: Three times a day (TID) | ORAL | 0 refills | Status: DC

## 2017-01-27 MED ORDER — IPRATROPIUM-ALBUTEROL 0.5-2.5 (3) MG/3ML IN SOLN
3.0000 mL | Freq: Once | RESPIRATORY_TRACT | Status: AC
  Administered 2017-01-27: 3 mL via RESPIRATORY_TRACT
  Filled 2017-01-27: qty 3

## 2017-01-27 MED ORDER — IOPAMIDOL (ISOVUE-370) INJECTION 76%
100.0000 mL | Freq: Once | INTRAVENOUS | Status: AC | PRN
  Administered 2017-01-27: 72 mL via INTRAVENOUS

## 2017-01-27 MED ORDER — IOPAMIDOL (ISOVUE-370) INJECTION 76%
INTRAVENOUS | Status: AC
  Filled 2017-01-27: qty 100

## 2017-01-27 NOTE — ED Provider Notes (Signed)
[facility] DEPT Provider Note   CSN: [id number] Arrival date & time: 01/27/17  5093     History   Chief Complaint Chief Complaint  Patient presents with  . Nasal Congestion  . Cough    HPI Valerie Figueroa is a 61 y.o. female.  HPI Valerie Figueroa is a 61 y.o. female with history of cervical cancer, status post radiation and chemotherapy, finished 1 year ago, presents to emergency department complaining of flulike symptoms. She reports nasal congestion, headache, cough, bodyaches for approximately 1 week. She states she has not had any fever. She states that yesterday she developed left-sided chest pain that was worse with coughing and deep breathing. She also reports some associated shortness of breath. She states that her chest pain actually did improve last night but persisted mildly through this morning which brought her to the ED. She denies any prior heart problems. No prior blood clots.  Past Medical History:  Diagnosis Date  . Cancer ([place])    cervical ca 2016  . Cervical cancer, FIGO stage IIIB [facility]) oncologist-  dr Marko Plume /  dr Sondra Come   Squamous Cell Carcinoma--  s/p chemotherapy and  pelvic external radiation 08-31-2015 to 09-04-2015/  currently getting high dose radaition thru [facility]  . Heart murmur    age 38 not followed by a cardiologist   . History of postmenopausal bleeding   . Iron deficiency anemia   . Radiation 08/31/15-09/04/15   pelvis 9 Gy  . Radiation 12/26/15, 01/04/16, 01/11/16, 01/17/16, 01/25/16   brachytherapy tandem and ring to cervical area     Patient Active Problem List   Diagnosis Date Noted  . Hematuria, gross 09/08/2016  . 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 Breast calcification, right 01/10/2016  . Leukopenia due to antineoplastic chemotherapy ([place]) 12/12/2015  . Cervical cancer, FIGO stage IIIB ([place]) 12/12/2015  . Encounter for tobacco use cessation counseling 11/29/2015  . 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  Cervical cancer, FIGO stage IVB ([place]) 10/01/2015  . Malignant neoplasm of cervix ([place]) 10/01/2015  . Hypersensitivity reaction 09/24/2015  . Multiple pulmonary nodules determined by computed tomography of lung 09/03/2015  . Continuous tobacco abuse 09/03/2015  . Need for prophylactic vaccination and inoculation against influenza 09/03/2015  . Iron deficiency anemia due to chronic blood loss 09/01/2015  . Breast mass, right 09/01/2015  . Postmenopausal bleeding probable cervical cancer 08/16/2015    Past Surgical History:  Procedure Laterality Date  . CARPAL TUNNEL RELEASE Bilateral   . OVARIAN CYST REMOVAL Left 01-17-1985  . TANDEM RING INSERTION N/A 12/26/2015   Procedure: TANDEM RING INSERTION;  Surgeon: Gery Pray, MD;  Location: [facility];  Service: Urology;  Laterality: N/A;  . TANDEM RING INSERTION N/A 01/04/2016   Procedure: TANDEM RING INSERTION;  Surgeon: Gery Pray, MD;  Location[facility];  Service: Urology;  Laterality: N/A;  . TANDEM RING INSERTION N/A 01/11/2016   Procedure: TANDEM RING INSERTION;  Surgeon: Gery Pray, MD;  Location: [facility];  Service: Urology;  Laterality: N/A;  . TANDEM RING INSERTION N/A 01/17/2016   Procedure: TANDEM RING INSERTION;  Surgeon: Gery Pray, MD;  Location: [facility];  Service: Urology;  Laterality: N/A;  . TANDEM RING INSERTION N/A 01/25/2016   Procedure: TANDEM RING INSERTION/ RING FOR HIGH DOSE RATE RADIATIONTHERAPY;  Surgeon: Gery Pray, MD;  Location[facility];  Service: Urology;  Laterality: N/A;  . TUBAL LIGATION      OB  History    No data available       Home Medications    Prior to Admission medications   Medication Sig Start Date End Date Taking? Authorizing Provider  acetaminophen (TYLENOL) 500 MG tablet Take 1,000 mg by mouth every 8 (eight) hours as needed for mild pain. Reported on 02/28/2016   Yes Historical Provider, MD  Multiple Vitamins-Minerals  (CENTRUM ULTRA WOMENS) TABS Take 1 tablet by mouth every morning.   Yes Historical Provider, MD  Phenylephrine-APAP-Guaifenesin (MUCINEX FAST-MAX) 10-650-400 MG/20ML LIQD Take 20 mLs by mouth as needed (For cold symptoms.).    Yes Historical Provider, MD  polyethylene glycol (MIRALAX / GLYCOLAX) packet Take 17 g by mouth daily as needed (constipation). Reported on 06/19/2016   Yes Historical Provider, MD    Family History Family History  Problem Relation Age of Onset  . Cancer Father     lung    Social History Social History  Substance Use Topics  . Smoking status: Current Every Day Smoker    Packs/day: 0.25    Years: 30.00    Types: Cigarettes  . Smokeless tobacco: Never Used  . Alcohol use No     Allergies   Paclitaxel   Review of Systems Review of Systems  Constitutional: Positive for chills. Negative for fever.  HENT: Positive for congestion and sore throat.   Respiratory: Positive for chest tightness and shortness of breath. Negative for cough.   Cardiovascular: Positive for chest pain. Negative for palpitations and leg swelling.  Gastrointestinal: Negative for abdominal pain, diarrhea, nausea and vomiting.  Genitourinary: Negative for dysuria, flank pain and pelvic pain.  Musculoskeletal: Negative for arthralgias, myalgias, neck pain and neck stiffness.  Skin: Negative for rash.  Neurological: Positive for headaches. Negative for dizziness and weakness.  All other systems reviewed and are negative.    Physical Exam Updated Vital Signs BP 131/79 (BP Location: Left Arm)   Pulse 96   Temp 98.6 F (37 C)   Resp 22   Ht '5\' 4"'$  (1.626 m)   Wt 63.5 kg   LMP 08/25/2015   SpO2 99%   BMI 24.03 kg/m   Physical Exam  Constitutional: She is oriented to person, place, and time. She appears well-developed and well-nourished. No distress.  HENT:  Head: Normocephalic and atraumatic.  Right Ear: Tympanic membrane, external ear and ear canal normal.  Left Ear: Tympanic  membrane, external ear and ear canal normal.  Nose: Mucosal edema and rhinorrhea present.  Mouth/Throat: Uvula is midline and mucous membranes are normal. Posterior oropharyngeal erythema present. No oropharyngeal exudate, posterior oropharyngeal edema or tonsillar abscesses.  Eyes: Conjunctivae are normal.  Neck: Neck supple.  Cardiovascular: Normal rate, regular rhythm, normal heart sounds and intact distal pulses.   Pulmonary/Chest: Effort normal. No respiratory distress. She has wheezes. She has no rales.  Mild end expiratory wheezes bilaterally  Abdominal: She exhibits no distension.  Musculoskeletal: Normal range of motion.  Neurological: She is alert and oriented to person, place, and time.  Skin: Skin is warm and dry.  Psychiatric: She has a normal mood and affect.  Nursing note and vitals reviewed.    ED Treatments / Results  Labs (all labs ordered are listed, but only abnormal results are displayed) Labs Reviewed  CBC WITH DIFFERENTIAL/PLATELET - Abnormal; Notable for the following:       Result Value   WBC 10.8 (*)    Hemoglobin 10.2 (*)    HCT 32.1 (*)    Neutro Abs 8.3 (*)  All other components within normal limits  D-DIMER, QUANTITATIVE (NOT AT [facility]) - Abnormal; Notable for the following:    D-Dimer, Quant 1.72 (*)    All other components within normal limits  I-STAT CHEM 8, ED - Abnormal; Notable for the following:    Hemoglobin 10.2 (*)    HCT 30.0 (*)    All other components within normal limits  I-STAT TROPOININ, ED    EKG  EKG Interpretation None       Radiology Dg Chest 2 View  Result Date: 01/27/2017 CLINICAL DATA:  Cough and congestion for 1 week. Left-side chest pain since yesterday. EXAM: CHEST  2 VIEW COMPARISON:  PET CT scan 09/03/2016 in 07/2019 02/2016. FINDINGS: Two nodular opacities are seen on the PA view only in the left mid lung zone measuring 0.8 and 0.6 cm in diameter. The right lung appears clear. No consolidative process,  pneumothorax or effusion. Heart size is normal. Aortic atherosclerosis is noted. IMPRESSION: Negative for pneumonia. Two nodular opacities in the left mid lung are seen on the PA view only. Recommend attention on follow-up examinations. No corresponding nodules are seen on the prior CT scans. Electronically Signed   By: Inge Rise M.D.   On: 01/27/2017 10:06    Procedures Procedures (including critical care time)  Medications Ordered in ED Medications  ipratropium-albuterol (DUONEB) 0.5-2.5 (3) MG/3ML nebulizer solution 3 mL (not administered)     Initial Impression / Assessment and Plan / ED Course  I have reviewed the triage vital signs and the nursing notes.  Pertinent labs & imaging results that were available during my care of the patient were reviewed by me and considered in my medical decision making (see chart for details).     Patient in emergency department with cough, congestion, left-sided chest pain. Chest pain is very atypical, however given persistence and shortness of breath will get labs, troponin, d-dimer due to pleuritic component. Patient in cervical cancer remission, last treatment a year ago. Certainly has risk factors for PE. Will get chest x-ray as well.  2:35 PM  patient's lab work unremarkable. Hemoglobin 10.2. White blood cell count of 0.8. D-dimer 1.72. CT angiogram obtained which showed no PE, however concerning for metastatic disease. Patient has an appointment with her oncologist on March 1, however discussed these findings with her and her family and instructed to call sooner for a closer appointment. I suspect her symptoms are unrelated to this finding, and she most likely has a viral upper respiratory tract infection. We will treat symptomatically for that. Return precautions discussed. Patient voiced understanding and will follow up with oncologist as soon as possible. Asked for work note for today and tomorrow.    Final Clinical Impressions(s) / ED  Diagnoses   Final diagnoses:  Viral URI with cough  Pulmonary nodules/lesions, multiple    New Prescriptions Discharge Medication List as of 01/27/2017  2:42 PM    START taking these medications   Details  benzonatate (TESSALON) 100 MG capsule Take 1 capsule (100 mg total) by mouth every 8 (eight) hours., Starting Mon 01/27/2017, Print         Jeannett Senior, PA-C 01/27/17 1542    Virgel Manifold, MD 01/29/17 [phone]

## 2017-01-27 NOTE — Discharge Instructions (Signed)
Tylenol and motrin for fever and pain. Inhaler 2 puffs every 4 hrs. Tessalon as prescribed as needed for cough. Rest. Follow up with your oncologist regarding lung lesions. Return if worsening symptoms.

## 2017-01-27 NOTE — ED Triage Notes (Signed)
Patient reports sinus congestion x 1 week.  States that she now feels like her chest is congested.  Reports productive cough.

## 2017-01-28 ENCOUNTER — Other Ambulatory Visit: Payer: Self-pay | Admitting: Hematology and Oncology

## 2017-01-28 DIAGNOSIS — R918 Other nonspecific abnormal finding of lung field: Secondary | ICD-10-CM

## 2017-01-28 DIAGNOSIS — C539 Malignant neoplasm of cervix uteri, unspecified: Secondary | ICD-10-CM

## 2017-01-30 ENCOUNTER — Telehealth: Payer: Self-pay

## 2017-01-30 NOTE — Telephone Encounter (Signed)
Pt called stating she went to ED Monday. She had a CT scan at the ED. The nurse in ED told her to call and get an appt sooner than 3/1 because of nodules seen on CT.   Prior auth for PET scan not completed yet.  Please call pt if appt change or not. OK to leave message.

## 2017-01-30 NOTE — Telephone Encounter (Signed)
I placed scheduling msg to see her next week

## 2017-01-31 ENCOUNTER — Telehealth: Payer: Self-pay | Admitting: Hematology and Oncology

## 2017-01-31 NOTE — Telephone Encounter (Signed)
sw pt to confirm 2/14 appt at noon per LOS

## 2017-02-05 ENCOUNTER — Telehealth: Payer: Self-pay | Admitting: Hematology and Oncology

## 2017-02-05 ENCOUNTER — Encounter: Payer: Self-pay | Admitting: Hematology and Oncology

## 2017-02-05 ENCOUNTER — Ambulatory Visit (HOSPITAL_BASED_OUTPATIENT_CLINIC_OR_DEPARTMENT_OTHER): Payer: BLUE CROSS/BLUE SHIELD

## 2017-02-05 ENCOUNTER — Ambulatory Visit (HOSPITAL_BASED_OUTPATIENT_CLINIC_OR_DEPARTMENT_OTHER): Payer: BLUE CROSS/BLUE SHIELD | Admitting: Hematology and Oncology

## 2017-02-05 VITALS — BP 150/65 | HR 109 | Temp 98.2°F | Resp 17 | Ht 64.0 in | Wt 138.7 lb

## 2017-02-05 DIAGNOSIS — D500 Iron deficiency anemia secondary to blood loss (chronic): Secondary | ICD-10-CM | POA: Diagnosis not present

## 2017-02-05 DIAGNOSIS — N6310 Unspecified lump in the right breast, unspecified quadrant: Secondary | ICD-10-CM

## 2017-02-05 DIAGNOSIS — D539 Nutritional anemia, unspecified: Secondary | ICD-10-CM

## 2017-02-05 DIAGNOSIS — C539 Malignant neoplasm of cervix uteri, unspecified: Secondary | ICD-10-CM

## 2017-02-05 DIAGNOSIS — R590 Localized enlarged lymph nodes: Secondary | ICD-10-CM

## 2017-02-05 DIAGNOSIS — C3412 Malignant neoplasm of upper lobe, left bronchus or lung: Secondary | ICD-10-CM

## 2017-02-05 DIAGNOSIS — Z720 Tobacco use: Secondary | ICD-10-CM | POA: Diagnosis not present

## 2017-02-05 DIAGNOSIS — R5383 Other fatigue: Secondary | ICD-10-CM

## 2017-02-05 DIAGNOSIS — R918 Other nonspecific abnormal finding of lung field: Secondary | ICD-10-CM

## 2017-02-05 LAB — CBC WITH DIFFERENTIAL/PLATELET
BASO%: 0.1 % (ref 0.0–2.0)
Basophils Absolute: 0 10*3/uL (ref 0.0–0.1)
EOS%: 0.7 % (ref 0.0–7.0)
Eosinophils Absolute: 0.1 10*3/uL (ref 0.0–0.5)
HCT: 31.8 % — ABNORMAL LOW (ref 34.8–46.6)
HGB: 10.2 g/dL — ABNORMAL LOW (ref 11.6–15.9)
LYMPH#: 1 10*3/uL (ref 0.9–3.3)
LYMPH%: 11.4 % — AB (ref 14.0–49.7)
MCH: 26.8 pg (ref 25.1–34.0)
MCHC: 32.1 g/dL (ref 31.5–36.0)
MCV: 83.7 fL (ref 79.5–101.0)
MONO#: 1 10*3/uL — AB (ref 0.1–0.9)
MONO%: 11 % (ref 0.0–14.0)
NEUT%: 76.8 % (ref 38.4–76.8)
NEUTROS ABS: 6.9 10*3/uL — AB (ref 1.5–6.5)
Platelets: 328 10*3/uL (ref 145–400)
RBC: 3.8 10*6/uL (ref 3.70–5.45)
RDW: 15.8 % — ABNORMAL HIGH (ref 11.2–14.5)
WBC: 9 10*3/uL (ref 3.9–10.3)

## 2017-02-05 LAB — FERRITIN: FERRITIN: 301 ng/mL — AB (ref 9–269)

## 2017-02-05 LAB — COMPREHENSIVE METABOLIC PANEL
ALT: 12 U/L (ref 0–55)
AST: 20 U/L (ref 5–34)
Albumin: 2.7 g/dL — ABNORMAL LOW (ref 3.5–5.0)
Alkaline Phosphatase: 101 U/L (ref 40–150)
Anion Gap: 11 mEq/L (ref 3–11)
BILIRUBIN TOTAL: 0.25 mg/dL (ref 0.20–1.20)
BUN: 7.8 mg/dL (ref 7.0–26.0)
CO2: 24 meq/L (ref 22–29)
CREATININE: 0.7 mg/dL (ref 0.6–1.1)
Calcium: 9.4 mg/dL (ref 8.4–10.4)
Chloride: 101 mEq/L (ref 98–109)
EGFR: >90 mL/min/{1.73_m2} (ref >90)
GLUCOSE: 99 mg/dL (ref 70–140)
Potassium: 3.9 mEq/L (ref 3.5–5.1)
Sodium: 136 mEq/L (ref 136–145)
TOTAL PROTEIN: 7 g/dL (ref 6.4–8.3)

## 2017-02-05 LAB — URINALYSIS, MICROSCOPIC - CHCC
BILIRUBIN (URINE): NEGATIVE
Blood: NEGATIVE
Glucose: NEGATIVE mg/dL
Ketones: NEGATIVE mg/dL
NITRITE: NEGATIVE
PH: 6 (ref 4.6–8.0)
Protein: NEGATIVE mg/dL
Specific Gravity, Urine: 1.005 (ref 1.003–1.035)
UROBILINOGEN UR: 0.2 mg/dL (ref 0.2–1)

## 2017-02-05 LAB — IRON AND TIBC
%SAT: 9 % — AB (ref 21–57)
IRON: 19 ug/dL — AB (ref 41–142)
TIBC: 205 ug/dL — ABNORMAL LOW (ref 236–444)
UIBC: 186 ug/dL (ref 120–384)

## 2017-02-05 LAB — TSH: TSH: 1.773 m(IU)/L (ref 0.308–3.960)

## 2017-02-05 MED ORDER — TRIAMCINOLONE ACETONIDE 55 MCG/ACT NA AERO: 2.0000 | INHALATION_SPRAY | Freq: Every day | NASAL | 12 refills | Status: DC

## 2017-02-05 MED ORDER — LORAZEPAM 0.5 MG PO TABS: 0.5000 mg | ORAL_TABLET | Freq: Two times a day (BID) | ORAL | 0 refills | Status: AC | PRN

## 2017-02-05 NOTE — Telephone Encounter (Signed)
Labs added for today. Appointments scheduled per 02/05/17 los. Patient was given a copy of the AVS report and appointment schedule per 02/05/17 los.

## 2017-02-06 ENCOUNTER — Encounter: Payer: Self-pay | Admitting: Hematology and Oncology

## 2017-02-06 LAB — URINE CULTURE

## 2017-02-06 LAB — VITAMIN B12: VITAMIN B 12: 822 pg/mL (ref 232–1245)

## 2017-02-06 NOTE — Progress Notes (Signed)
[place] progress notes  Patient Care Team: [name], MD as PCP - General (Obstetrics and Gynecology)  CHIEF COMPLAINTS/PURPOSE OF VISIT:  Possible widespread metastatic cancer  HISTORY OF PRESENTING ILLNESS:  Valerie Figueroa 61 y.o. female was transferred to my care after her prior physician has left.  I reviewed the patient's records extensive and collaborated the history with the patient. Summary of her history is as follows:   Cervix cancer ([place]) (Resolved)   08/16/2015 Initial Diagnosis    Cervix cancer       Malignant neoplasm of cervix ([place])   08/08/2015 Initial Diagnosis    Patient has had no regular medical care in years, including no PAP in >15 years. She had no bleeding since menopause until several hours of heavy vaginal bleeding in 04-2015, which resolved other than intermittent spotting until extremely heavy vaginal bleeding again 08-08-15.      08/16/2015 Pathology Results    Cervix, biopsy - SQUAMOUS CELL CARCINOMA. Microscopic Comment The specimens are involved by invasive squamous cell carcinoma, moderately to poorly differentiated with focal basaloid features.      08/16/2015 Miscellaneous    She was seen in [facility] ED then, with Hgb 8.0; she was given premarin and continued on oral megace x 2 weeks. She was seen by Dr Mallory Shirk on 08-16-15 , with finding of large fungating cervical mass with parametrial involvement      08/21/2015 Imaging    Large irregular mass involving the cervix and lower uterine segment compatible with recently biopsied cervical malignancy. There is associated dilatation of the endometrial canal. Multiple enlarged retroperitoneal and pelvic lymph nodes concerning for metastatic adenopathy. Abnormal enhancement of the urothelium involving the right renal collecting system and proximal right ureter raising the possibility of an infectious process. Recommend correlation with urinalysis. Posterior to  the right kidney there is irregular soft tissue density material which may represent a small amount of infectious/inflammatory stranding of the perinephric fat. Irregular nodular opacity within the right lower lobe measuring up to 2.7 cm may be secondary to an infectious or inflammatory process however is concerning for metastatic disease in the setting of known cervical malignancy or possible primary pulmonary malignancy. Multiple additional 2-3 mm nodules within the lungs bilaterally may be infectious or inflammatory in etiology or potentially secondary to metastatic disease. 10 mm sclerotic focus within the lateral left fifth rib may represent a bone island or osseous metastasis. Oval mass within the upper-outer quadrant of the right breast. Recommend dedicated evaluation with mammography if not previously Evaluated.       08/31/2015 - 09/04/2015 Radiation Therapy    Pelvis 9 Gy in 3 fxs for initial vaginal bleeding issues      09/06/2015 PET scan    Hypermetabolic cervical mass with hypermetabolic abdominal and peritoneal retroperitoneal adenopathy, consistent with the given history of cervical carcinoma. 2. Hypermetabolic left supraclavicular and left axillary lymph nodes. Left axillary lymph nodes are new from 08/21/2015, suggesting an infectious or inflammatory etiology. Attention on followup exams is warranted. 3. Mildly hypermetabolic lateral right breast nodule. Patient underwent mammography and ultrasound on 09/01/2015. 4. Coronary artery calcification. 5. Cholelithiasis.      09/08/2015 Pathology Results    Breast, right, needle core biopsy, upper outer quadrant at 10:30 o'clock - FIBROADENOMA WITH CALCIFICATIONS. - THERE IS NO EVIDENCE OF MALIGNANCY. - SEE COMMENT. Microscopic Comment There results were called to the [facility] on 09/11/2015.      09/22/2015 - 12/04/2015  Chemotherapy    She received chemotherapy initially with paclitaxel, developed allergic reaction.  Regimen was changed to CDDP gemzar, cycle 1 given 10-06-15 and cycle 2 on 10-19-15. Case was reviewed at multidisciplinary conference, with repeat CT chest favoring reactive nodes over metastatic disease. Decision was made to give definitive radiation with sensitizing CDDP. She had 5 weekly cycles of CDDP from 11-02-15 thru 12-04-15 with IMRT      10/24/2015 Imaging    Minimally decreased prominence of sub-cm left axillary lymph nodes, and stable sub-cm left supraclavicular lymph nodes. This suggests a resolving reactive or inflammatory etiology, with metastatic disease considered less likely. No definite evidence of metastatic disease or other acute findings within the thorax.      11/09/2015 - 12/21/2015 Radiation Therapy    Pelvis/paraaortic, 32.4 Gy in 18 fractions Pelvis/paraaortic boost, 5.4 Gy in 3 fractions Paraaortic boost, 12.6 Gy in 7 fractions       12/26/2015 - 01/25/2016 Radiation Therapy    12/26/2015, 01/04/2016, 01/11/2016, 01/17/2016, 01/25/2016: The cervical area was boosted with 5 brachytherapy treatments using iridium 192 as the high-dose-rate source, the cervical region received 5.5 gray each of her 5 brachytherapy treatments.      03/21/2016 Imaging    Interval response to therapy. There has been resolution of previous surgical mass. Abdominal and pelvic adenopathy has also resolved in the interval. 2. Residual trace ascites identified within the right pericolic gutter an dependent portion of pelvis. 3. Aortic atherosclerosis. 4. Gallstones.      06/19/2016 Pathology Results    NEGATIVE FOR INTRAEPITHELIAL LESIONS OR MALIGNANCY. BENIGN REACTIVE/REPARATIVE CHANGES.      08/14/2016 Imaging    Interval development of enlarged mediastinal lymph nodes worrisome for metastatic adenopathy. 2. New right lower lobe pulmonary nodule. 3. No evidence for recurrent adenopathy within the abdomen or pelvis. 4. Gallstones 5. Aortic atherosclerosis and multi vessel coronary artery  calcification.      08/14/2016 Miscellaneous    Ophthalmlogy exam 07-2016 question of right choroidal met. CT head was negative      09/03/2016 PET scan    Enlarged Hypermetabolic mediastinal hilar lymph nodes consistent with metastatic adenopathy. 2. Hypermetabolic LEFT axillary lymph node is indeterminate. Normal morphology. 3. Low metabolic activity of the larger RIGHT lower lobe pulmonary nodule favors benign etiology. Smaller RIGHT lobe nodules too small to characterize 4. Small RIGHT breast mass with biopsy clip and low metabolic activity not changed from prior. 5. No evidence of local recurrence or metastasis within the abdomen or pelvis.      01/27/2017 Imaging    No evidence of pulmonary embolus. Aortic atherosclerosis. Extensive mediastinal adenopathy as well as bilateral pulmonary nodules are noted consistent with metastatic disease. Left axillary and bilateral supraclavicular adenopathy is noted as well.       I was alerted of recent CT imaging study that was done in the emergency department. The patient presented with sensation of fatigue, dizziness, lightheadedness, nasal congestion, and poor appetite. She has lost some weight. She had evaluation including CT angiogram which show extensive, progressive disease within her mediastinum. She denies recent visual changes. The patient denies any recent signs or symptoms of bleeding such as spontaneous epistaxis, hematuria or hematochezia.  MEDICAL HISTORY:  Past Medical History:  Diagnosis Date  . Cancer ([place])    cervical ca 2016  . Cervical cancer, FIGO stage IIIB [facility]) oncologist-  dr Marko Plume /  dr Sondra Come   Squamous Cell Carcinoma--  s/p chemotherapy and  pelvic external radiation 08-31-2015 to 09-04-2015/  currently getting high dose radaition thru [facility]  . Heart murmur    age 8 not followed by a cardiologist   . History of postmenopausal bleeding   . Iron deficiency anemia   . Radiation 08/31/15-09/04/15   pelvis 9 Gy  .  Radiation 12/26/15, 01/04/16, 01/11/16, 01/17/16, 01/25/16   brachytherapy tandem and ring to cervical area     SURGICAL HISTORY: Past Surgical History:  Procedure Laterality Date  . CARPAL TUNNEL RELEASE Bilateral   . OVARIAN CYST REMOVAL Left 01-17-1985  . TANDEM RING INSERTION N/A 12/26/2015   Procedure: TANDEM RING INSERTION;  Surgeon: Gery Pray, MD;  Location: [facility];  Service: Urology;  Laterality: N/A;  . TANDEM RING INSERTION N/A 01/04/2016   Procedure: TANDEM RING INSERTION;  Surgeon: Gery Pray, MD;  Location: [facility];  Service: Urology;  Laterality: N/A;  . TANDEM RING INSERTION N/A 01/11/2016   Procedure: TANDEM RING INSERTION;  Surgeon: Gery Pray, MD;  Location: [facility];  Service: Urology;  Laterality: N/A;  . TANDEM RING INSERTION N/A 01/17/2016   Procedure: TANDEM RING INSERTION;  Surgeon: Gery Pray, MD;  Location: [facility];  Service: Urology;  Laterality: N/A;  . TANDEM RING INSERTION N/A 01/25/2016   Procedure: TANDEM RING INSERTION/ RING FOR HIGH DOSE RATE RADIATIONTHERAPY;  Surgeon: Gery Pray, MD;  Location: [facility];  Service: Urology;  Laterality: N/A;  . TUBAL LIGATION      SOCIAL HISTORY: Social History   Social History  . Marital status: Married    Spouse name: N/A  . Number of children: 3  . Years of education: N/A   Occupational History  . [employer]    Social History Main Topics  . Smoking status: Current Every Day Smoker    Packs/day: 0.25    Years: 30.00    Types: Cigarettes  . Smokeless tobacco: Never Used  . Alcohol use No  . Drug use: No  . Sexual activity: Not Currently    Birth control/ protection: Surgical   Other Topics Concern  . Not on file   Social History Narrative  . No narrative on file    FAMILY HISTORY: Family History  Problem Relation Age of Onset  . Cancer Father     lung    ALLERGIES:  is allergic to paclitaxel.  MEDICATIONS:   Current Outpatient Prescriptions  Medication Sig Dispense Refill  . acetaminophen (TYLENOL) 500 MG tablet Take 1,000 mg by mouth every 8 (eight) hours as needed for mild pain. Reported on 02/28/2016    . benzonatate (TESSALON) 100 MG capsule Take 1 capsule (100 mg total) by mouth every 8 (eight) hours. 21 capsule 0  . Multiple Vitamins-Minerals (CENTRUM ULTRA WOMENS) TABS Take 1 tablet by mouth every morning.    Marland Kitchen Phenylephrine-APAP-Guaifenesin (MUCINEX FAST-MAX) 10-650-400 MG/20ML LIQD Take 20 mLs by mouth as needed (For cold symptoms.).     Marland Kitchen polyethylene glycol (MIRALAX / GLYCOLAX) packet Take 17 g by mouth daily as needed (constipation). Reported on 06/19/2016    . LORazepam (ATIVAN) 0.5 MG tablet Take 1 tablet (0.5 mg total) by mouth 2 (two) times daily as needed for anxiety. 20 tablet 0  . triamcinolone (NASACORT AQ) 55 MCG/ACT AERO nasal inhaler Place 2 sprays into the nose daily. 1 Inhaler 12   No current facility-administered medications for this visit.     REVIEW OF SYSTEMS:   Constitutional: Denies fevers, chills or abnormal night sweats Eyes: Denies blurriness of vision,  double vision or watery eyes Ears, nose, mouth, throat, and face: Denies mucositis or sore throat Respiratory: Denies cough, dyspnea or wheezes Cardiovascular: Denies palpitation, chest discomfort or lower extremity swelling Gastrointestinal:  Denies nausea, heartburn or change in bowel habits Skin: Denies abnormal skin rashes Lymphatics: Denies new lymphadenopathy or easy bruising Behavioral/Psych: Mood is stable, no new changes  All other systems were reviewed with the patient and are negative.  PHYSICAL EXAMINATION: ECOG PERFORMANCE STATUS: 1 - Symptomatic but completely ambulatory  Vitals:   02/05/17 1202  BP: (!) 150/65  Pulse: (!) 109  Resp: 17  Temp: 98.2 F (36.8 C)   Filed Weights   02/05/17 1202  Weight: 138 lb 11.2 oz (62.9 kg)    GENERAL:alert, no distress and comfortable SKIN: skin  color, texture, turgor are normal, no rashes or significant lesions EYES: normal, conjunctiva are pink and non-injected, sclera clear OROPHARYNX:no exudate, normal lips, buccal mucosa, and tongue  NECK: supple, thyroid normal size, non-tender, without nodularity LYMPH:  She has fullness on the left axilla  LUNGS: clear to auscultation and percussion with normal breathing effort HEART: regular rate & rhythm and no murmurs without lower extremity edema ABDOMEN:abdomen soft, non-tender and normal bowel sounds Musculoskeletal:no cyanosis of digits and no clubbing  PSYCH: alert & oriented x 3 with fluent speech NEURO: no focal motor/sensory deficits Bilateral breast examination were performed. Palpable right breast lump  LABORATORY DATA:  I have reviewed the data as listed Lab Results  Component Value Date   WBC 9.0 02/05/2017   HGB 10.2 (L) 02/05/2017   HCT 31.8 (L) 02/05/2017   MCV 83.7 02/05/2017   PLT 328 02/05/2017    Recent Labs  09/05/16 1520 11/18/16 0851 01/27/17 1050 02/05/17 1323  NA 141 137 136 136  K 4.1 3.9 3.7 3.9  CL  --   --  104  --   CO2 24 23  --  24  GLUCOSE 99 94 97 99  BUN 15.1 9.4 7 7.8  CREATININE 1.1 0.7 0.50 0.7  CALCIUM 9.8 9.8  --  9.4  PROT 7.5 7.5  --  7.0  ALBUMIN 3.6 3.4*  --  2.7*  AST 21 23  --  20  ALT 33 29  --  12  ALKPHOS 129 129  --  101  BILITOT <0.30 0.25  --  0.25    RADIOGRAPHIC STUDIES:I reviewed multiple imaging studies with the patient I have personally reviewed the radiological images as listed and agreed with the findings in the report. Dg Chest 2 View  Result Date: 01/27/2017 CLINICAL DATA:  Cough and congestion for 1 week. Left-side chest pain since yesterday. EXAM: CHEST  2 VIEW COMPARISON:  PET CT scan 09/03/2016 in 07/2019 02/2016. FINDINGS: Two nodular opacities are seen on the PA view only in the left mid lung zone measuring 0.8 and 0.6 cm in diameter. The right lung appears clear. No consolidative process,  pneumothorax or effusion. Heart size is normal. Aortic atherosclerosis is noted. IMPRESSION: Negative for pneumonia. Two nodular opacities in the left mid lung are seen on the PA view only. Recommend attention on follow-up examinations. No corresponding nodules are seen on the prior CT scans. Electronically Signed   By: Inge Rise M.D.   On: 01/27/2017 10:06   Ct Angio Chest Pe W And/or Wo Contrast  Result Date: 01/27/2017 CLINICAL DATA:  Productive cough, shortness of breath. EXAM: CT ANGIOGRAPHY CHEST WITH CONTRAST TECHNIQUE: Multidetector CT imaging of the chest was performed using the  standard protocol during bolus administration of intravenous contrast. Multiplanar CT image reconstructions and MIPs were obtained to evaluate the vascular anatomy. CONTRAST:  72 mL of Isovue 370 intravenously. COMPARISON:  CT scan of August 14, 2016. FINDINGS: Cardiovascular: Atherosclerosis of thoracic aorta is noted without aneurysm or dissection. There is no definite evidence of pulmonary embolus. Mediastinum/Nodes: Overall, significantly increased mediastinal adenopathy is noted concerning for metastatic disease. Right hilar lymph node measuring 24 mm is noted. Prevascular lymph node measuring 23 mm is noted aortopulmonary window lymph node measuring 15 mm is noted. 12 mm left hilar lymph node is noted. Subcarinal adenopathy measuring 46 x 20 mm is noted. Pretracheal lymph node measuring 18 mm is noted. Left axillary adenopathy is noted with largest lymph node measuring 12 mm. 15 mm left supraclavicular adenopathy is noted. 14 mm right supraclavicular adenopathy is noted. Lungs/Pleura: No pneumothorax or pleural effusion is noted. Multiple pulmonary nodules of varying sizes are noted consistent with metastatic disease. The largest on the right measures 2.6 x 2.0 cm in the lung base. The largest on the left measures 14 x 9 mm in the upper lobe. Upper Abdomen: No acute abnormality. Musculoskeletal: No chest wall  abnormality. No acute or significant osseous findings. Review of the MIP images confirms the above findings. IMPRESSION: No evidence of pulmonary embolus. Aortic atherosclerosis. Extensive mediastinal adenopathy as well as bilateral pulmonary nodules are noted consistent with metastatic disease. Left axillary and bilateral supraclavicular adenopathy is noted as well. Electronically Signed   By: Marijo Conception, M.D.   On: 01/27/2017 13:49    ASSESSMENT & PLAN:  Cancer of upper lobe of left lung ([place]) I review her records in great detail. She has complex history of cervical cancer, biopsy-proven, right breast mass which remain palpable but with benign pathology and widespread lymphadenopathy within her mediastinum with prior negative biopsy. The pattern that I see on her CT scan is most concerning for possible undiagnosed primary lung cancer versus lymphoma. I will arrange for ultrasound-guided lymph node biopsy of the left axilla. Given her recent visual symptoms and symptoms of congestion and concern for lung cancer, I will also order MRI of the head to complete staging. The patient has mild claustrophobia. I will give her premedication with lorazepam   Iron deficiency anemia due to chronic blood loss She has chronic iron deficiency anemia. She complained of fatigue but her hemoglobin is adequate. I will consider intravenous iron replacement therapy in the future The patient denies any recent signs or symptoms of bleeding such as spontaneous epistaxis, hematuria or hematochezia.   Malignant neoplasm of cervix [facility]) Imaging study from a year ago showed no evidence of active dizziness within her pelvis. We will redirect our focus on pursuing additional workup for the disease within the thorax  Breast mass, right The breast mass is palpable on exam. However, according to extensive workup in the past, it was benign. Continue surveillance mammogram  Continuous tobacco abuse I spent some time  counseling the patient the importance of tobacco cessation. We discussed common strategies including nicotine patches, Tobacco Quit-line, and other nicotine replacement products to assist in hereffort to quit  she appears motivated to quit.    Orders Placed This Encounter  Procedures  . Urine culture    Standing Status:   Future    Number of Occurrences:   1    Standing Expiration Date:   03/12/2018  . US Biopsy    Standing Status:   Future    Standing Expiration  Date:   04/05/2018    Order Specific Question:   Lab orders requested (DO NOT place separate lab orders, these will be automatically ordered during procedure specimen collection):    Answer:   Surgical Pathology    Order Specific Question:   Reason for Exam (SYMPTOM  OR DIAGNOSIS REQUIRED)    Answer:   Hx cervical cancer, breast mass, diffuse metastatic ca, ?lung cancer or other pathology    Order Specific Question:   Preferred imaging location?    Answer:   [facility]    Standing Status:   Future    Standing Expiration Date:   03/12/2018    Order Specific Question:   Reason for exam:    Answer:   staging lung cancer, dizziness    Order Specific Question:   Preferred imaging location?    Answer:   [facility] (table limit-350 lbs)    Order Specific Question:   Does the patient have a pacemaker or implanted devices?    Answer:   No  . CBC with Differential/Platelet    Standing Status:   Future    Number of Occurrences:   1    Standing Expiration Date:   03/12/2018  . Comprehensive metabolic panel    Standing Status:   Future    Number of Occurrences:   1    Standing Expiration Date:   03/12/2018  . Urinalysis, Microscopic - [facility]    Standing Status:   Future    Number of Occurrences:   1    Standing Expiration Date:   03/12/2018  . TSH    Standing Status:   Future    Number of Occurrences:   1    Standing Expiration Date:   03/12/2018  . Iron and TIBC    Standing Status:    Future    Number of Occurrences:   1    Standing Expiration Date:   03/12/2018  . Ferritin    Standing Status:   Future    Number of Occurrences:   1    Standing Expiration Date:   03/12/2018  . Vitamin B12    Standing Status:   Future    Number of Occurrences:   1    Standing Expiration Date:   03/12/2018  . Hold Tube, Blood Bank    Standing Status:   Future    Number of Occurrences:   1    Standing Expiration Date:   03/12/2018    All questions were answered. The patient knows to call the clinic with any problems, questions or concerns. I spent 60 minutes counseling the patient face to face. The total time spent in the appointment was 80 minutes and more than 50% was on counseling.     Heath Lark, MD 02/06/2017 7:02 AM

## 2017-02-06 NOTE — Assessment & Plan Note (Signed)
I review her records in great detail. She has complex history of cervical cancer, biopsy-proven, right breast mass which remain palpable but with benign pathology and widespread lymphadenopathy within her mediastinum with prior negative biopsy. The pattern that I see on her CT scan is most concerning for possible undiagnosed primary lung cancer versus lymphoma. I will arrange for ultrasound-guided lymph node biopsy of the left axilla. Given her recent visual symptoms and symptoms of congestion and concern for lung cancer, I will also order MRI of the head to complete staging. The patient has mild claustrophobia. I will give her premedication with lorazepam

## 2017-02-06 NOTE — Assessment & Plan Note (Signed)
Imaging study from a year ago showed no evidence of active dizziness within her pelvis. We will redirect our focus on pursuing additional workup for the disease within the thorax

## 2017-02-06 NOTE — Assessment & Plan Note (Signed)
The breast mass is palpable on exam. However, according to extensive workup in the past, it was benign. Continue surveillance mammogram

## 2017-02-06 NOTE — Assessment & Plan Note (Signed)
I spent some time counseling the patient the importance of tobacco cessation. We discussed common strategies including nicotine patches, Tobacco Quit-line, and other nicotine replacement products to assist in hereffort to quit  she appears motivated to quit.

## 2017-02-06 NOTE — Assessment & Plan Note (Signed)
She has chronic iron deficiency anemia. She complained of fatigue but her hemoglobin is adequate. I will consider intravenous iron replacement therapy in the future The patient denies any recent signs or symptoms of bleeding such as spontaneous epistaxis, hematuria or hematochezia.

## 2017-02-13 ENCOUNTER — Ambulatory Visit (HOSPITAL_COMMUNITY): Payer: BLUE CROSS/BLUE SHIELD

## 2017-02-13 ENCOUNTER — Other Ambulatory Visit: Payer: Self-pay | Admitting: Student

## 2017-02-14 ENCOUNTER — Other Ambulatory Visit: Payer: Self-pay | Admitting: Radiology

## 2017-02-14 ENCOUNTER — Ambulatory Visit (HOSPITAL_COMMUNITY)
Admission: RE | Admit: 2017-02-14 | Discharge: 2017-02-14 | Disposition: A | Discharge status: Home / Self Care | Payer: BLUE CROSS/BLUE SHIELD | Source: Ambulatory Visit | Attending: Hematology and Oncology | Admitting: Hematology and Oncology

## 2017-02-14 ENCOUNTER — Encounter (HOSPITAL_COMMUNITY)
Admission: RE | Admit: 2017-02-14 | Discharge: 2017-02-14 | Disposition: A | Discharge status: Home / Self Care | Payer: BLUE CROSS/BLUE SHIELD | Source: Ambulatory Visit | Attending: Hematology and Oncology | Admitting: Hematology and Oncology

## 2017-02-14 DIAGNOSIS — C771 Secondary and unspecified malignant neoplasm of intrathoracic lymph nodes: Secondary | ICD-10-CM | POA: Diagnosis not present

## 2017-02-14 DIAGNOSIS — G939 Disorder of brain, unspecified: Secondary | ICD-10-CM | POA: Diagnosis not present

## 2017-02-14 DIAGNOSIS — J90 Pleural effusion, not elsewhere classified: Secondary | ICD-10-CM | POA: Diagnosis not present

## 2017-02-14 DIAGNOSIS — R42 Dizziness and giddiness: Secondary | ICD-10-CM | POA: Diagnosis present

## 2017-02-14 DIAGNOSIS — C539 Malignant neoplasm of cervix uteri, unspecified: Secondary | ICD-10-CM | POA: Insufficient documentation

## 2017-02-14 DIAGNOSIS — C7971 Secondary malignant neoplasm of right adrenal gland: Secondary | ICD-10-CM | POA: Diagnosis not present

## 2017-02-14 DIAGNOSIS — C3412 Malignant neoplasm of upper lobe, left bronchus or lung: Secondary | ICD-10-CM | POA: Diagnosis present

## 2017-02-14 DIAGNOSIS — R918 Other nonspecific abnormal finding of lung field: Secondary | ICD-10-CM

## 2017-02-14 LAB — GLUCOSE, CAPILLARY: Glucose-Capillary: 108 mg/dL — ABNORMAL HIGH (ref 65–99)

## 2017-02-14 MED ORDER — FLUDEOXYGLUCOSE F - 18 (FDG) INJECTION
7.6600 | Freq: Once | INTRAVENOUS | Status: AC | PRN
  Administered 2017-02-14: 7.66 via INTRAVENOUS

## 2017-02-14 MED ORDER — GADOBENATE DIMEGLUMINE 529 MG/ML IV SOLN
15.0000 mL | Freq: Once | INTRAVENOUS | Status: AC | PRN
  Administered 2017-02-14: 13 mL via INTRAVENOUS

## 2017-02-17 ENCOUNTER — Telehealth: Payer: Self-pay | Admitting: Hematology and Oncology

## 2017-02-17 ENCOUNTER — Ambulatory Visit (HOSPITAL_COMMUNITY)
Admission: RE | Admit: 2017-02-17 | Discharge: 2017-02-17 | Disposition: A | Discharge status: Home / Self Care | Payer: BLUE CROSS/BLUE SHIELD | Source: Ambulatory Visit | Attending: Hematology and Oncology | Admitting: Hematology and Oncology

## 2017-02-17 ENCOUNTER — Encounter (HOSPITAL_COMMUNITY): Payer: Self-pay

## 2017-02-17 DIAGNOSIS — Z8541 Personal history of malignant neoplasm of cervix uteri: Secondary | ICD-10-CM | POA: Diagnosis present

## 2017-02-17 DIAGNOSIS — F17210 Nicotine dependence, cigarettes, uncomplicated: Secondary | ICD-10-CM | POA: Diagnosis not present

## 2017-02-17 DIAGNOSIS — J90 Pleural effusion, not elsewhere classified: Secondary | ICD-10-CM | POA: Diagnosis not present

## 2017-02-17 DIAGNOSIS — C539 Malignant neoplasm of cervix uteri, unspecified: Secondary | ICD-10-CM

## 2017-02-17 DIAGNOSIS — Z9221 Personal history of antineoplastic chemotherapy: Secondary | ICD-10-CM | POA: Diagnosis not present

## 2017-02-17 DIAGNOSIS — K8020 Calculus of gallbladder without cholecystitis without obstruction: Secondary | ICD-10-CM | POA: Diagnosis not present

## 2017-02-17 DIAGNOSIS — C3412 Malignant neoplasm of upper lobe, left bronchus or lung: Secondary | ICD-10-CM

## 2017-02-17 DIAGNOSIS — I700 Atherosclerosis of aorta: Secondary | ICD-10-CM | POA: Diagnosis not present

## 2017-02-17 DIAGNOSIS — C7971 Secondary malignant neoplasm of right adrenal gland: Secondary | ICD-10-CM | POA: Diagnosis not present

## 2017-02-17 DIAGNOSIS — Z923 Personal history of irradiation: Secondary | ICD-10-CM | POA: Diagnosis not present

## 2017-02-17 DIAGNOSIS — C770 Secondary and unspecified malignant neoplasm of lymph nodes of head, face and neck: Secondary | ICD-10-CM | POA: Diagnosis not present

## 2017-02-17 DIAGNOSIS — R590 Localized enlarged lymph nodes: Secondary | ICD-10-CM | POA: Diagnosis present

## 2017-02-17 DIAGNOSIS — R918 Other nonspecific abnormal finding of lung field: Secondary | ICD-10-CM

## 2017-02-17 LAB — CBC
HEMATOCRIT: 30.9 % — AB (ref 36.0–46.0)
HEMOGLOBIN: 9.9 g/dL — AB (ref 12.0–15.0)
MCH: 26.1 pg (ref 26.0–34.0)
MCHC: 32 g/dL (ref 30.0–36.0)
MCV: 81.5 fL (ref 78.0–100.0)
Platelets: 466 10*3/uL — ABNORMAL HIGH (ref 150–400)
RBC: 3.79 MIL/uL — AB (ref 3.87–5.11)
RDW: 16.3 % — ABNORMAL HIGH (ref 11.5–15.5)
WBC: 11 10*3/uL — AB (ref 4.0–10.5)

## 2017-02-17 LAB — COMPREHENSIVE METABOLIC PANEL
ALBUMIN: 2.9 g/dL — AB (ref 3.5–5.0)
ALK PHOS: 93 U/L (ref 38–126)
ALT: 12 U/L — ABNORMAL LOW (ref 14–54)
ANION GAP: 11 (ref 5–15)
AST: 21 U/L (ref 15–41)
BUN: 10 mg/dL (ref 6–20)
CALCIUM: 9 mg/dL (ref 8.9–10.3)
CO2: 23 mmol/L (ref 22–32)
CREATININE: 0.6 mg/dL (ref 0.44–1.00)
Chloride: 102 mmol/L (ref 101–111)
GFR calc Af Amer: >60 mL/min (ref >60)
GFR calc non Af Amer: >60 mL/min (ref >60)
Glucose, Bld: 113 mg/dL — ABNORMAL HIGH (ref 65–99)
Potassium: 3.6 mmol/L (ref 3.5–5.1)
SODIUM: 136 mmol/L (ref 135–145)
Total Bilirubin: 0.2 mg/dL — ABNORMAL LOW (ref 0.3–1.2)
Total Protein: 7.3 g/dL (ref 6.5–8.1)

## 2017-02-17 LAB — PROTIME-INR
INR: 1.03
Prothrombin Time: 13.5 seconds (ref 11.4–15.2)

## 2017-02-17 LAB — APTT: aPTT: 31 seconds (ref 24–36)

## 2017-02-17 MED ORDER — MIDAZOLAM HCL 2 MG/2ML IJ SOLN
INTRAMUSCULAR | Status: AC
  Filled 2017-02-17: qty 6

## 2017-02-17 MED ORDER — SODIUM CHLORIDE 0.9 % IV SOLN
INTRAVENOUS | Status: DC
  Administered 2017-02-17: 12:00:00 via INTRAVENOUS

## 2017-02-17 MED ORDER — MIDAZOLAM HCL 2 MG/2ML IJ SOLN
INTRAMUSCULAR | Status: AC | PRN
  Administered 2017-02-17 (×2): 1 mg via INTRAVENOUS

## 2017-02-17 MED ORDER — FENTANYL CITRATE (PF) 100 MCG/2ML IJ SOLN
INTRAMUSCULAR | Status: AC | PRN
  Administered 2017-02-17: 50 ug via INTRAVENOUS

## 2017-02-17 MED ORDER — HYDROCODONE-ACETAMINOPHEN 5-325 MG PO TABS: 1.0000 | ORAL_TABLET | ORAL | Status: DC | PRN

## 2017-02-17 MED ORDER — FENTANYL CITRATE (PF) 100 MCG/2ML IJ SOLN
INTRAMUSCULAR | Status: AC
  Filled 2017-02-17: qty 4

## 2017-02-17 NOTE — Procedures (Signed)
US guided core biopsies of left supraclavicular lymph node.  6 cores obtained.  Minimal blood loss and no immediate complication. See full report in Imaging.

## 2017-02-17 NOTE — Discharge Instructions (Signed)
Needle Biopsy, Care After Introduction These instructions give you information about caring for yourself after your procedure. Your doctor may also give you more specific instructions. Call your doctor if you have any problems or questions after your procedure. Follow these instructions at home:  Rest as told by your doctor.  Take medicines only as told by your doctor.  There are many different ways to close and cover the biopsy site, including stitches (sutures), skin glue, and adhesive strips. Follow instructions from your doctor about:  How to take care of your biopsy site.  When and how you should change your bandage (dressing).  When you should remove your dressing.  Removing whatever was used to close your biopsy site.  Check your biopsy site every day for signs of infection. Watch for:  Redness, swelling, or pain.  Fluid, blood, or pus. Contact a doctor if:  You have a fever.  You have redness, swelling, or pain at the biopsy site, and it lasts longer than a few days.  You have fluid, blood, or pus coming from the biopsy site.  You feel sick to your stomach (nauseous).  You throw up (vomit). Get help right away if:  You are short of breath.  You have trouble breathing.  Your chest hurts.  You feel dizzy or you pass out (faint).  You have bleeding that does not stop with pressure or a bandage.  You cough up blood.  Your belly (abdomen) hurts. This information is not intended to replace advice given to you by your health care provider. Make sure you discuss any questions you have with your health care provider. Document Released: 11/21/2008 Document Revised: 05/16/2016 Document Reviewed: 12/05/2014  2017 Elsevier  Moderate Conscious Sedation, Adult, Care After These instructions provide you with information about caring for yourself after your procedure. Your health care provider may also give you more specific instructions. Your treatment has been planned  according to current medical practices, but problems sometimes occur. Call your health care provider if you have any problems or questions after your procedure. What can I expect after the procedure? After your procedure, it is common:  To feel sleepy for several hours.  To feel clumsy and have poor balance for several hours.  To have poor judgment for several hours.  To vomit if you eat too soon. Follow these instructions at home: For at least 24 hours after the procedure:   Do not:  Participate in activities where you could fall or become injured.  Drive.  Use heavy machinery.  Drink alcohol.  Take sleeping pills or medicines that cause drowsiness.  Make important decisions or sign legal documents.  Take care of children on your own.  Rest. Eating and drinking  Follow the diet recommended by your health care provider.  If you vomit:  Drink water, juice, or soup when you can drink without vomiting.  Make sure you have little or no nausea before eating solid foods. General instructions  Have a responsible adult stay with you until you are awake and alert.  Take over-the-counter and prescription medicines only as told by your health care provider.  If you smoke, do not smoke without supervision.  Keep all follow-up visits as told by your health care provider. This is important. Contact a health care provider if:  You keep feeling nauseous or you keep vomiting.  You feel light-headed.  You develop a rash.  You have a fever. Get help right away if:  You have trouble breathing. This  information is not intended to replace advice given to you by your health care provider. Make sure you discuss any questions you have with your health care provider. Document Released: 09/29/2013 Document Revised: 05/13/2016 Document Reviewed: 03/30/2016 Elsevier Interactive Patient Education  2017 [employer].

## 2017-02-17 NOTE — Telephone Encounter (Signed)
I reviewed her imaging study over the phone.  MRI showed new brain metastasis.  I will try to get in touch with her radiation oncologist for urgent appointment to address brain metastases.  In the meantime, I will try to get hold of radiologist today regarding sites for biopsy.

## 2017-02-17 NOTE — Consult Note (Signed)
Chief Complaint: Patient was seen in consultation today for image guided left supraclavicular lymph node biopsy  Referring Physician(s): Gorsuch,Ni  Supervising Physician: Markus Daft  Patient Status: [facility] - Out-pt  History of Present Illness: Valerie Figueroa is a 61 y.o. female smoker with prior history of cervical cancer 2016, status post chemoradiation. Recent staging PET scan done 02/14/17 has revealed:  1. Interval development of new and interval progression of the existing mediastinal and hilar lymphadenopathy. 2. Interval development of hypermetabolic supraclavicular nodal metastases bilaterally and progression of hypermetabolic left axillary lymphadenopathy. 3. Interval development of numerous bilateral pulmonary nodules of varying sizes, many of which are markedly hypermetabolic. Imaging features are consistent with progression of pulmonary metastases. An area of confluent airspace consolidation in the anterior left lower lobe is hypermetabolic and may represent tumor or combination of tumor and infection/inflammation. 4. New small left pleural effusion. 5. Interval development of hypermetabolic right adrenal metastasis. 6. Interval development of small hypermetabolic retroperitoneal lymph nodes in the abdomen, suspicious for metastatic disease  She presents today for ultrasound-guided left supraclavicular lymph node biopsy for further evaluation. Past Medical History:  Diagnosis Date  . Cancer ([place])    cervical ca 2016  . Cervical cancer, FIGO stage IIIB [facility]) oncologist-  dr Marko Plume /  dr Sondra Come   Squamous Cell Carcinoma--  s/p chemotherapy and  pelvic external radiation 08-31-2015 to 09-04-2015/  currently getting high dose radaition thru [facility]  . Heart murmur    age 38 not followed by a cardiologist   . History of postmenopausal bleeding   . Iron deficiency anemia   . Radiation 08/31/15-09/04/15   pelvis 9 Gy  . Radiation 12/26/15, 01/04/16, 01/11/16,  01/17/16, 01/25/16   brachytherapy tandem and ring to cervical area     Past Surgical History:  Procedure Laterality Date  . CARPAL TUNNEL RELEASE Bilateral   . OVARIAN CYST REMOVAL Left 01-17-1985  . TANDEM RING INSERTION N/A 12/26/2015   Procedure: TANDEM RING INSERTION;  Surgeon: Gery Pray, MD;  Location: [facility];  Service: Urology;  Laterality: N/A;  . TANDEM RING INSERTION N/A 01/04/2016   Procedure: TANDEM RING INSERTION;  Surgeon: Gery Pray, MD;  Location: [facility];  Service: Urology;  Laterality: N/A;  . TANDEM RING INSERTION N/A 01/11/2016   Procedure: TANDEM RING INSERTION;  Surgeon: Gery Pray, MD;  Location: [facility];  Service: Urology;  Laterality: N/A;  . TANDEM RING INSERTION N/A 01/17/2016   Procedure: TANDEM RING INSERTION;  Surgeon: Gery Pray, MD;  Location: [facility];  Service: Urology;  Laterality: N/A;  . TANDEM RING INSERTION N/A 01/25/2016   Procedure: TANDEM RING INSERTION/ RING FOR HIGH DOSE RATE RADIATIONTHERAPY;  Surgeon: Gery Pray, MD;  Location: [facility];  Service: Urology;  Laterality: N/A;  . TUBAL LIGATION      Allergies: Paclitaxel  Medications: Prior to Admission medications   Medication Sig Start Date End Date Taking? Authorizing Provider  acetaminophen (TYLENOL) 500 MG tablet Take 1,000 mg by mouth every 8 (eight) hours as needed for mild pain. Reported on 02/28/2016    Historical Provider, MD  benzonatate (TESSALON) 100 MG capsule Take 1 capsule (100 mg total) by mouth every 8 (eight) hours. 01/27/17   Tatyana Kirichenko, PA-C  LORazepam (ATIVAN) 0.5 MG tablet Take 1 tablet (0.5 mg total) by mouth 2 (two) times daily as needed for anxiety. 02/05/17   Heath Lark, MD  Multiple Vitamins-Minerals (CENTRUM ULTRA WOMENS) TABS Take 1 tablet  by mouth every morning.    Historical Provider, MD  Phenylephrine-APAP-Guaifenesin ([place] FAST-MAX) 10-650-400 MG/20ML LIQD Take 20 mLs by mouth as  needed (For cold symptoms.).     Historical Provider, MD  polyethylene glycol (MIRALAX / GLYCOLAX) packet Take 17 g by mouth daily as needed (constipation). Reported on 06/19/2016    Historical Provider, MD  triamcinolone (NASACORT AQ) 55 MCG/ACT AERO nasal inhaler Place 2 sprays into the nose daily. 02/05/17   Heath Lark, MD     Family History  Problem Relation Age of Onset  . Cancer Father     lung    Social History   Social History  . Marital status: Married    Spouse name: N/A  . Number of children: 3  . Years of education: N/A   Occupational History  . [employer]    Social History Main Topics  . Smoking status: Current Every Day Smoker    Packs/day: 0.25    Years: 30.00    Types: Cigarettes  . Smokeless tobacco: Never Used  . Alcohol use No  . Drug use: No  . Sexual activity: Not Currently    Birth control/ protection: Surgical   Other Topics Concern  . Not on file   Social History Narrative  . No narrative on file     Review of Systems currently denies fever, chest pain, abdominal pain, vomiting or abnormal bleeding. She  does have occasional headaches, dizziness, fatigue/weakness, weight loss, diminished appetite, dyspnea, cough, and nausea  Vital Signs: BP 128/78 (BP Location: Right Arm)   Pulse (!) 121   Temp 98.6 F (37 C) (Oral)   Resp 16   LMP 08/25/2015   SpO2 98%   Physical Exam awake, alert. Chest with slightly diminished breath sounds left base, right clear. Heart with tachycardic but regular rhythm. Abdomen soft, positive bowel sounds, mild generalized tenderness to palpation, lower extremities -no edema.  Mallampati Score:     Imaging: Dg Chest 2 View  Result Date: 01/27/2017 CLINICAL DATA:  Cough and congestion for 1 week. Left-side chest pain since yesterday. EXAM: CHEST  2 VIEW COMPARISON:  PET CT scan 09/03/2016 in 07/2019 02/2016. FINDINGS: Two nodular opacities are seen on the PA view only in the left mid lung zone measuring  0.8 and 0.6 cm in diameter. The right lung appears clear. No consolidative process, pneumothorax or effusion. Heart size is normal. Aortic atherosclerosis is noted. IMPRESSION: Negative for pneumonia. Two nodular opacities in the left mid lung are seen on the PA view only. Recommend attention on follow-up examinations. No corresponding nodules are seen on the prior CT scans. Electronically Signed   By: Inge Rise M.D.   On: 01/27/2017 10:06   Ct Angio Chest Pe W And/or Wo Contrast  Result Date: 01/27/2017 CLINICAL DATA:  Productive cough, shortness of breath. EXAM: CT ANGIOGRAPHY CHEST WITH CONTRAST TECHNIQUE: Multidetector CT imaging of the chest was performed using the standard protocol during bolus administration of intravenous contrast. Multiplanar CT image reconstructions and MIPs were obtained to evaluate the vascular anatomy. CONTRAST:  72 mL of Isovue 370 intravenously. COMPARISON:  CT scan of August 14, 2016. FINDINGS: Cardiovascular: Atherosclerosis of thoracic aorta is noted without aneurysm or dissection. There is no definite evidence of pulmonary embolus. Mediastinum/Nodes: Overall, significantly increased mediastinal adenopathy is noted concerning for metastatic disease. Right hilar lymph node measuring 24 mm is noted. Prevascular lymph node measuring 23 mm is noted aortopulmonary window lymph node measuring 15 mm is noted. 12 mm  left hilar lymph node is noted. Subcarinal adenopathy measuring 46 x 20 mm is noted. Pretracheal lymph node measuring 18 mm is noted. Left axillary adenopathy is noted with largest lymph node measuring 12 mm. 15 mm left supraclavicular adenopathy is noted. 14 mm right supraclavicular adenopathy is noted. Lungs/Pleura: No pneumothorax or pleural effusion is noted. Multiple pulmonary nodules of varying sizes are noted consistent with metastatic disease. The largest on the right measures 2.6 x 2.0 cm in the lung base. The largest on the left measures 14 x 9 mm in the  upper lobe. Upper Abdomen: No acute abnormality. Musculoskeletal: No chest wall abnormality. No acute or significant osseous findings. Review of the MIP images confirms the above findings. IMPRESSION: No evidence of pulmonary embolus. Aortic atherosclerosis. Extensive mediastinal adenopathy as well as bilateral pulmonary nodules are noted consistent with metastatic disease. Left axillary and bilateral supraclavicular adenopathy is noted as well. Electronically Signed   By: Marijo Conception, M.D.   On: 01/27/2017 13:49   Mr Jeri Cos TI Contrast  Result Date: 02/14/2017 CLINICAL DATA:  Lung cancer.  Dizziness. EXAM: MRI HEAD WITHOUT AND WITH CONTRAST TECHNIQUE: Multiplanar, multiecho pulse sequences of the brain and surrounding structures were obtained without and with intravenous contrast. CONTRAST:  49m MULTIHANCE GADOBENATE DIMEGLUMINE 529 MG/ML IV SOLN COMPARISON:  CT head without and with contrast 08/14/2016 FINDINGS: Brain: The enhancing lesion within the anterior right middle frontal gyrus measures 7.5 x 6.5 x 8.0 mm on image 19 of series 12. A left occipital metastasis measures 8.5 x 7.5 x 8.0 mm on image 25 of series 11. No other focal parenchymal enhancement is present. There is mild T2 changes associated with these lesions. No acute infarcts or hemorrhage is present. Ventricles are of normal size. Minimal periventricular white matter changes are otherwise within normal limits for age. No significant extra-axial fluid collection is present. Vascular: Flow is present in the major intracranial arteries. Skull and upper cervical spine: The skullbase is normal. The craniocervical junction is normal. The upper cervical spine is unremarkable. Midline sagittal structures are otherwise within normal limits. Sinuses/Orbits: Mucosal thickening and polyps are present in the maxillary sinuses, right greater than left. The remaining paranasal sinuses and right mastoid air cells are clear. IMPRESSION: 1. Two 8 mm  enhancing lesions compatible with metastases in the anterior right frontal lobe and left occipital lobe. 2. The mild sinus disease. Electronically Signed   By: CSan MorelleM.D.   On: 02/14/2017 17:19   Nm Pet Image Restag (ps) Skull Base To Thigh  Result Date: 02/14/2017 CLINICAL DATA:  Subsequent treatment strategy for cervical cancer. EXAM: NUCLEAR MEDICINE PET SKULL BASE TO THIGH TECHNIQUE: 7.7 MCi F-18 FDG was injected intravenously. Full-ring PET imaging was performed from the skull base to thigh after the radiotracer. CT data was obtained and used for attenuation correction and anatomic localization. FASTING BLOOD GLUCOSE:  Value: 108 mg/dl COMPARISON:  09/03/2016 FINDINGS: NECK Interval development of hypermetabolic lymph nodes in the supraclavicular region bilaterally. New 11 mm left supraclavicular lymph node (image 39 series 4) demonstrates SUV max = 5.9. CHEST Interval progression of hypermetabolic left axillary lymphadenopathy. Mediastinal and hilar lymphadenopathy has progressed in the interval with new lymph nodes evident in previous lymph nodes enlarging in the interval. 13 mm short axis high right paratracheal lymph node (image 46 series 4) was 5 mm short axis previously and today demonstrates SUV max = 10.5. A new hypermetabolic right infrahilar lymph node demonstrates SUV max = 9.3. Interval development  of multiple bilateral pulmonary nodules, too numerous to count. 1.5 cm anterior left upper lobe pulmonary nodule (image 20 series 8) is new since prior study and demonstrates SUV max = 11.4. 3.2 cm hypermetabolic nodule in the right costophrenic sulcus is new in the interval. There is confluent hypermetabolic airspace disease in the anterior left lower lobe. Small left pleural effusion is new in the interval. Low level uptake again identified in the right breast lesion containing localization clip. ABDOMEN/PELVIS No abnormal hypermetabolic activity within the liver, pancreas, or spleen.  A new 1.8 cm right adrenal nodule is hypermetabolic with SUV max = 9.9. A new 7 mm short axis left para-aortic lymph node (image 123 series 4) is hypermetabolic with SUV max = 5.3. Hypermetabolic small aortocaval lymph node is identified at the same level. Several hypermetabolic foci are seen scattered along the small bowel/mesentery, but choroid live lesions cannot be identified on the noncontrast CT images. Cholelithiasis again noted. Abdominal aortic atherosclerosis evident without aneurysm. Small volume intraperitoneal free fluid. SKELETON No focal hypermetabolic activity to suggest skeletal metastasis. IMPRESSION: 1. Interval development of new and interval progression of the existing mediastinal and hilar lymphadenopathy. 2. Interval development of hypermetabolic supraclavicular nodal metastases bilaterally and progression of hypermetabolic left axillary lymphadenopathy. 3. Interval development of numerous bilateral pulmonary nodules of varying sizes, many of which are markedly hypermetabolic. Imaging features are consistent with progression of pulmonary metastases. An area of confluent airspace consolidation in the anterior left lower lobe is hypermetabolic and may represent tumor or combination of tumor and infection/inflammation. 4. New small left pleural effusion. 5. Interval development of hypermetabolic right adrenal metastasis. 6. Interval development of small hypermetabolic retroperitoneal lymph nodes in the abdomen, suspicious for metastatic disease. Electronically Signed   By: Misty Stanley M.D.   On: 02/14/2017 16:29    Labs:  CBC:  Recent Labs  09/05/16 1520 11/18/16 0851 01/27/17 1038 01/27/17 1050 02/05/17 1322  WBC 7.1 7.3 10.8*  --  9.0  HGB 11.8 10.7* 10.2* 10.2* 10.2*  HCT 35.0 32.5* 32.1* 30.0* 31.8*  PLT 256 237 362  --  328    COAGS: No results for input(s): INR, APTT in the last 8760 hours.  BMP:  Recent Labs  08/01/16 0830 09/05/16 1520 11/18/16 0851  01/27/17 1050 02/05/17 1323  NA 139 141 137 136 136  K 4.0 4.1 3.9 3.7 3.9  CL  --   --   --  104  --   CO2 '24 24 23  '$ --  24  GLUCOSE 98 99 94 97 99  BUN 12.3 15.1 9.4 7 7.8  CALCIUM 10.0 9.8 9.8  --  9.4  CREATININE 0.7 1.1 0.7 0.50 0.7    LIVER FUNCTION TESTS:  Recent Labs  08/01/16 0830 09/05/16 1520 11/18/16 0851 02/05/17 1323  BILITOT <0.30 <0.30 0.25 0.25  AST '25 21 23 20  '$ ALT 35 33 29 12  ALKPHOS 105 129 129 101  PROT 7.5 7.5 7.5 7.0  ALBUMIN 3.7 3.6 3.4* 2.7*    TUMOR MARKERS: No results for input(s): AFPTM, CEA, CA199, CHROMGRNA in the last 8760 hours.  Assessment and Plan: 61 y.o. female smoker with prior history of cervical cancer 2016, status post chemoradiation. Recent staging PET scan done 02/14/17 has revealed:  1. Interval development of new and interval progression of the existing mediastinal and hilar lymphadenopathy. 2. Interval development of hypermetabolic supraclavicular nodal metastases bilaterally and progression of hypermetabolic left axillary lymphadenopathy. 3. Interval development of numerous bilateral pulmonary nodules of  varying sizes, many of which are markedly hypermetabolic. Imaging features are consistent with progression of pulmonary metastases. An area of confluent airspace consolidation in the anterior left lower lobe is hypermetabolic and may represent tumor or combination of tumor and infection/inflammation. 4. New small left pleural effusion. 5. Interval development of hypermetabolic right adrenal metastasis. 6. Interval development of small hypermetabolic retroperitoneal lymph nodes in the abdomen, suspicious for metastatic disease  She presents today for ultrasound-guided left supraclavicular lymph node biopsy for further evaluation.Risks and benefits discussed with the patient/family including, but not limited to bleeding, infection, damage to adjacent structures or low yield requiring additional tests. All of the patient's  questions were answered, patient is agreeable to proceed. Consent signed and in chart.     Thank you for this interesting consult.  I greatly enjoyed meeting LEI DOWER and look forward to participating in their care.  A copy of this report was sent to the requesting provider on this date.  Electronically Signed: D. Rowe Robert 02/17/2017, 11:32 AM   I spent a total of 25 minutes in face to face in clinical consultation, greater than 50% of which was counseling/coordinating care for ultrasound-guided left supraclavicular lymph node biopsy

## 2017-02-18 ENCOUNTER — Other Ambulatory Visit: Payer: Self-pay | Admitting: Radiation Therapy

## 2017-02-18 DIAGNOSIS — C7931 Secondary malignant neoplasm of brain: Secondary | ICD-10-CM

## 2017-02-18 DIAGNOSIS — C7949 Secondary malignant neoplasm of other parts of nervous system: Principal | ICD-10-CM

## 2017-02-19 ENCOUNTER — Ambulatory Visit
Admission: RE | Admit: 2017-02-19 | Discharge: 2017-02-19 | Disposition: A | Discharge status: Home / Self Care | Payer: BLUE CROSS/BLUE SHIELD | Source: Ambulatory Visit | Attending: Radiation Oncology | Admitting: Radiation Oncology

## 2017-02-19 ENCOUNTER — Other Ambulatory Visit: Payer: Self-pay | Admitting: Hematology and Oncology

## 2017-02-19 ENCOUNTER — Encounter: Payer: Self-pay | Admitting: Radiation Oncology

## 2017-02-19 VITALS — BP 149/92 | HR 114 | Temp 98.0°F | Resp 18 | Wt 132.8 lb

## 2017-02-19 DIAGNOSIS — F17210 Nicotine dependence, cigarettes, uncomplicated: Secondary | ICD-10-CM | POA: Diagnosis not present

## 2017-02-19 DIAGNOSIS — C3410 Malignant neoplasm of upper lobe, unspecified bronchus or lung: Secondary | ICD-10-CM | POA: Insufficient documentation

## 2017-02-19 DIAGNOSIS — Z510 Encounter for antineoplastic radiation therapy: Secondary | ICD-10-CM | POA: Insufficient documentation

## 2017-02-19 DIAGNOSIS — C7931 Secondary malignant neoplasm of brain: Secondary | ICD-10-CM | POA: Diagnosis not present

## 2017-02-19 DIAGNOSIS — R918 Other nonspecific abnormal finding of lung field: Secondary | ICD-10-CM | POA: Insufficient documentation

## 2017-02-19 DIAGNOSIS — R590 Localized enlarged lymph nodes: Secondary | ICD-10-CM | POA: Insufficient documentation

## 2017-02-19 DIAGNOSIS — C3412 Malignant neoplasm of upper lobe, left bronchus or lung: Secondary | ICD-10-CM

## 2017-02-19 DIAGNOSIS — Z8541 Personal history of malignant neoplasm of cervix uteri: Secondary | ICD-10-CM | POA: Diagnosis not present

## 2017-02-19 DIAGNOSIS — C539 Malignant neoplasm of cervix uteri, unspecified: Secondary | ICD-10-CM

## 2017-02-19 DIAGNOSIS — C7971 Secondary malignant neoplasm of right adrenal gland: Secondary | ICD-10-CM | POA: Diagnosis not present

## 2017-02-19 HISTORY — DX: Malignant neoplasm of brain, unspecified: C71.9

## 2017-02-19 NOTE — Progress Notes (Signed)
Radiation Oncology         [phone] ________________________________  Re-Consultation Visit  Name: Valerie Figueroa MRN: [id number]  Date: 02/19/2017  DOB: 1957-05-63  YO:VZCHYIFO,YDXA V, MD  Heath Lark, MD   REFERRING PHYSICIAN: Heath Lark, MD  DIAGNOSIS: The primary encounter diagnosis was Cancer of upper lobe of left lung ([place]). Diagnoses of Cervical cancer, FIGO stage IIIB ([place]) and Brain metastasis ([place]) were also pertinent to this visit.    ICD-9-CM ICD-10-CM   1. Cancer of upper lobe of left lung (HCC) 162.3 C34.12   2. Cervical cancer, FIGO stage IIIB (HCC) 180.9 C53.9   3. Brain metastasis (HCC) 198.3 C79.31     HISTORY OF PRESENT ILLNESS: Valerie Figueroa is a 61 y.o. female seen at the request of Dr. 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  She received chemotherapy initially with paclitaxel but was changed to CDDP gemzar due to allergic reaction to paclitaxel. She completed 5 weekly cycles of CDDP from 11-02-15 thru 12-04-15 with adjuvant IMRT followed by HDR (5 treatments from 12/26/15-01/25/16). She appeared to have an excellent response to treatment based on initial post-treatment imaging with CT demonstrating resolution of previously noted cervical mass as well as abdominal and pelvic adenopathy.  Follow up CT A/P 08/14/16 demonstrated interval development of a new posterior mediastinal lymph node measuring 6m, a new 178mleft paratracheal node and 13m16might paratracheal node worrisome for metastatic adenopathy as well as a new 8mm713msterior RLL pulmonary nodule.   CT Head was performed 08/14/16 for disease staging and did NOT demonstrate evidence of intracranial metastatic disease.  PET scan 09/03/16 showed:Enlarged Hypermetabolic mediastinal hilar lymph nodes consistent with metastatic adenopathy. Hypermetabolic LEFT axillary lymph node is  indeterminate. Low metabolic activity of the larger RIGHT lower lobe pulmonary nodule favors benign etiology. Smaller RIGHT lobe nodules too small to characterize.  Decision with medical oncology at that time was to repeat imaging in 6 months.  Repeat staging PET 02/14/17 revealed further disease progression with interval development of new and interval progression of the existing mediastinal and hilar lymphadenopathy, numerous bilateral pulmonary nodules,  supraclavicular nodal metastases bilaterally and progression of hypermetabolic left axillary lymphadenopathy. Also noted are new hypermetabolic right adrenal metastasis and small hypermetabolic retroperitoneal lymph nodes in the abdomen, suspicious for metastatic disease.  MRI Brain 02/14/17 demonstrates a 7.5 x 6.5 x 8.0mm 54mancing lesion in the anterior right frontal lobe and an 8.5 x 7.5 x 8.0 mm enhancing lesion in the left occipital lobe.  Patient underwent ultrasound-guided biopsy of the left supraclavicular lymph node on 02/17/17 and pathology has returned confirming metastatic squamous cell carcinoma.   PREVIOUS RADIATION THERAPY: Yes   12/26/2015, 01/04/2016, 01/11/2016, 01/17/2016, 01/25/2016: The cervical area was boosted with 5 brachytherapy treatments using iridium 192 as the high-dose-rate source, the cervical region received 5.5 gray each of her 5 brachytherapy treatments.  11/09/15 - 12/21/15: Pelvis/paraaortic, 32.4 Gy in 18 fractions Pelvis/paraaortic boost, 5.4 Gy in 3 fractions Paraaortic boost, 12.6 Gy in 7 fractions  08/31/15 - 09/04/15: Pelvis 9 Gy in 3 fxs for initial vaginal bleeding issues  PAST MEDICAL HISTORY:  Past Medical History:  Diagnosis Date  . Brain cancer (HCC) [place] Cervical cancer (HCC) [place] Cervical cancer, FIGO stage IIIB [facility] livesMarko Plumer kinarSondra Comeuamous Cell Carcinoma--  s/p chemotherapy and  pelvic external radiation 08-31-2015  to 09-04-2015/  currently getting high dose radaition thru  [facility]  . Heart murmur    age 87 not followed by a cardiologist   . History of postmenopausal bleeding   . Iron deficiency anemia   . Radiation 08/31/15-09/04/15   pelvis 9 Gy  . Radiation 12/26/15, 01/04/16, 01/11/16, 01/17/16, 01/25/16   brachytherapy tandem and ring to cervical area       PAST SURGICAL HISTORY: Past Surgical History:  Procedure Laterality Date  . CARPAL TUNNEL RELEASE Bilateral   . OVARIAN CYST REMOVAL Left 01-17-1985  . TANDEM RING INSERTION N/A 12/26/2015   Procedure: TANDEM RING INSERTION;  Surgeon: Gery Pray, MD;  Location: [facility];  Service: Urology;  Laterality: N/A;  . TANDEM RING INSERTION N/A 01/04/2016   Procedure: TANDEM RING INSERTION;  Surgeon: Gery Pray, MD;  Location[facility];  Service: Urology;  Laterality: N/A;  . TANDEM RING INSERTION N/A 01/11/2016   Procedure: TANDEM RING INSERTION;  Surgeon: Gery Pray, MD;  Location: [facility];  Service: Urology;  Laterality: N/A;  . TANDEM RING INSERTION N/A 01/17/2016   Procedure: TANDEM RING INSERTION;  Surgeon: Gery Pray, MD;  Location: [facility];  Service: Urology;  Laterality: N/A;  . TANDEM RING INSERTION N/A 01/25/2016   Procedure: TANDEM RING INSERTION/ RING FOR HIGH DOSE RATE RADIATIONTHERAPY;  Surgeon: Gery Pray, MD;  Location: [facility];  Service: Urology;  Laterality: N/A;  . TUBAL LIGATION      FAMILY HISTORY:  Family History  Problem Relation Age of Onset  . Cancer Father     lung    SOCIAL HISTORY:  Social History   Social History  . Marital status: Married    Spouse name: N/A  . Number of children: 3  . Years of education: N/A   Occupational History  . [employer]    Social History Main Topics  . Smoking status: Current Every Day Smoker    Packs/day: 0.25    Years: 30.00    Types: Cigarettes  . Smokeless tobacco: Never Used  . Alcohol use No  . Drug use: No  . Sexual activity: Not Currently     Birth control/ protection: Surgical   Other Topics Concern  . Not on file   Social History Narrative  . No narrative on file    ALLERGIES: Paclitaxel  MEDICATIONS:  Current Outpatient Prescriptions  Medication Sig Dispense Refill  . acetaminophen (TYLENOL) 500 MG tablet Take 1,000 mg by mouth every 8 (eight) hours as needed for mild pain. Reported on 02/28/2016    . Multiple Vitamins-Minerals (CENTRUM ULTRA WOMENS) TABS Take 1 tablet by mouth every morning.    Marland Kitchen LORazepam (ATIVAN) 0.5 MG tablet Take 1 tablet (0.5 mg total) by mouth 2 (two) times daily as needed for anxiety. (Patient not taking: Reported on 02/19/2017) 20 tablet 0   No current facility-administered medications for this encounter.     REVIEW OF SYSTEMS:  On review of systems, the patient reports that she is doing well overall. She denies any chest pain, fevers, chills, night sweats. She reports fatigue which affects her ability to work. She reports feeling run down x 1 month+. She reports that she gets short of breath more easily over the past 2-3 months. She reports cough with sputum production for the last several weeks which has improved over time. She denies any bowel disturbances, and denies abdominal pain. She reports occasional constipation. She reports nausea but, denies vomiting.  She reports poor appetite and weight loss x 1 month. She occasionally drinks Carnation Instant Breakfast to supplement her diet. She reports for several weeks she has had intermittent left side pain just above her left hip which radiates anterior to posterior and wakes her up at night. She denies seizures, confusion/memory deficits, or difficulty with hand coordination. She reports intermittent frontal headaches, and reports taking Tylenol and resting typically work to resolve headache. She reports dizziness intermittently worse when she rises from a sitting to standing position. She reports focal numbness/weakness with prolonged standing from her  knee to her ankle feels numb. She reports occasional left ear tinnitus which is an ongoing issue and she is unsure how long this has been happening- no recent change. A complete review of systems is obtained and is otherwise negative.    PHYSICAL EXAM:  Wt Readings from Last 3 Encounters:  02/19/17 132 lb 12.8 oz (60.2 kg)  02/05/17 138 lb 11.2 oz (62.9 kg)  01/27/17 140 lb (63.5 kg)   Temp Readings from Last 3 Encounters:  02/19/17 98 F (36.7 C) (Oral)  02/17/17 98.1 F (36.7 C) (Oral)  02/05/17 98.2 F (36.8 C) (Oral)   BP Readings from Last 3 Encounters:  02/19/17 (!) 149/92  02/17/17 140/61  02/05/17 (!) 150/65   Pulse Readings from Last 3 Encounters:  02/19/17 (!) 114  02/17/17 96  02/05/17 (!) 109   Pain Assessment Pain Score: 0-No pain (see progress note )/10  In general this is a well appearing caucasian in no acute distress. She is alert and oriented x4 and appropriate throughout the examination. HEENT reveals that the patient is normocephalic, atraumatic. EOMs are intact. PERRLA. Skin is intact without any evidence of gross lesions. Cardiovascular exam reveals a regular rate and rhythm, no clicks rubs or murmurs are auscultated. Chest positive for expiratory wheezing to auscultation bilaterally. Lymphatic assessment is performed and does not reveal any adenopathy in the cervical, supraclavicular, axillary, or inguinal chains. Biopsy site at left supraclavicular region is well healed without signs of infection, just mild resolving ecchymosis. Abdomen has active bowel sounds in all quadrants and is intact. The abdomen is soft, non distended. There is tenderness on palpation of the mid-abdomen with no guarding. Lower extremities are negative for pretibial pitting edema, deep calf tenderness, cyanosis or clubbing.   KPS = 90  100 - Normal; no complaints; no evidence of disease. 90   - Able to carry on normal activity; minor signs or symptoms of disease. 80   - Normal  activity with effort; some signs or symptoms of disease. 60   - Cares for self; unable to carry on normal activity or to do active work. 60   - Requires occasional assistance, but is able to care for most of his personal needs. 50   - Requires considerable assistance and frequent medical care. 55   - Disabled; requires special care and assistance. 31   - Severely disabled; hospital admission is indicated although death not imminent. 51   - Very sick; hospital admission necessary; active supportive treatment necessary. 10   - Moribund; fatal processes progressing rapidly. 0     - Dead  Karnofsky DA, Abelmann WH, Craver LS and Burchenal [facility] [phone]) The use of the nitrogen mustards in the palliative treatment of carcinoma: with particular reference to bronchogenic carcinoma Cancer 1 634-56  LABORATORY DATA:  Lab Results  Component Value Date   WBC 11.0 (H) 02/17/2017   HGB 9.9 (L) 02/17/2017   HCT  30.9 (L) 02/17/2017   MCV 81.5 02/17/2017   PLT 466 (H) 02/17/2017   Lab Results  Component Value Date   NA 136 02/17/2017   K 3.6 02/17/2017   CL 102 02/17/2017   CO2 23 02/17/2017   Lab Results  Component Value Date   ALT 12 (L) 02/17/2017   AST 21 02/17/2017   ALKPHOS 93 02/17/2017   BILITOT 0.2 (L) 02/17/2017     RADIOGRAPHY: Dg Chest 2 View  Result Date: 01/27/2017 CLINICAL DATA:  Cough and congestion for 1 week. Left-side chest pain since yesterday. EXAM: CHEST  2 VIEW COMPARISON:  PET CT scan 09/03/2016 in 07/2019 02/2016. FINDINGS: Two nodular opacities are seen on the PA view only in the left mid lung zone measuring 0.8 and 0.6 cm in diameter. The right lung appears clear. No consolidative process, pneumothorax or effusion. Heart size is normal. Aortic atherosclerosis is noted. IMPRESSION: Negative for pneumonia. Two nodular opacities in the left mid lung are seen on the PA view only. Recommend attention on follow-up examinations. No corresponding nodules are seen on the prior CT  scans. Electronically Signed   By: Inge Rise M.D.   On: 01/27/2017 10:06   Ct Angio Chest Pe W And/or Wo Contrast  Result Date: 01/27/2017 CLINICAL DATA:  Productive cough, shortness of breath. EXAM: CT ANGIOGRAPHY CHEST WITH CONTRAST TECHNIQUE: Multidetector CT imaging of the chest was performed using the standard protocol during bolus administration of intravenous contrast. Multiplanar CT image reconstructions and MIPs were obtained to evaluate the vascular anatomy. CONTRAST:  72 mL of Isovue 370 intravenously. COMPARISON:  CT scan of August 14, 2016. FINDINGS: Cardiovascular: Atherosclerosis of thoracic aorta is noted without aneurysm or dissection. There is no definite evidence of pulmonary embolus. Mediastinum/Nodes: Overall, significantly increased mediastinal adenopathy is noted concerning for metastatic disease. Right hilar lymph node measuring 24 mm is noted. Prevascular lymph node measuring 23 mm is noted aortopulmonary window lymph node measuring 15 mm is noted. 12 mm left hilar lymph node is noted. Subcarinal adenopathy measuring 46 x 20 mm is noted. Pretracheal lymph node measuring 18 mm is noted. Left axillary adenopathy is noted with largest lymph node measuring 12 mm. 15 mm left supraclavicular adenopathy is noted. 14 mm right supraclavicular adenopathy is noted. Lungs/Pleura: No pneumothorax or pleural effusion is noted. Multiple pulmonary nodules of varying sizes are noted consistent with metastatic disease. The largest on the right measures 2.6 x 2.0 cm in the lung base. The largest on the left measures 14 x 9 mm in the upper lobe. Upper Abdomen: No acute abnormality. Musculoskeletal: No chest wall abnormality. No acute or significant osseous findings. Review of the MIP images confirms the above findings. IMPRESSION: No evidence of pulmonary embolus. Aortic atherosclerosis. Extensive mediastinal adenopathy as well as bilateral pulmonary nodules are noted consistent with metastatic  disease. Left axillary and bilateral supraclavicular adenopathy is noted as well. Electronically Signed   By: Marijo Conception, M.D.   On: 01/27/2017 13:49   Mr Jeri Cos EX Contrast  Result Date: 02/14/2017 CLINICAL DATA:  Lung cancer.  Dizziness. EXAM: MRI HEAD WITHOUT AND WITH CONTRAST TECHNIQUE: Multiplanar, multiecho pulse sequences of the brain and surrounding structures were obtained without and with intravenous contrast. CONTRAST:  101m MULTIHANCE GADOBENATE DIMEGLUMINE 529 MG/ML IV SOLN COMPARISON:  CT head without and with contrast 08/14/2016 FINDINGS: Brain: The enhancing lesion within the anterior right middle frontal gyrus measures 7.5 x 6.5 x 8.0 mm on image 19 of series 12. A  left occipital metastasis measures 8.5 x 7.5 x 8.0 mm on image 25 of series 11. No other focal parenchymal enhancement is present. There is mild T2 changes associated with these lesions. No acute infarcts or hemorrhage is present. Ventricles are of normal size. Minimal periventricular white matter changes are otherwise within normal limits for age. No significant extra-axial fluid collection is present. Vascular: Flow is present in the major intracranial arteries. Skull and upper cervical spine: The skullbase is normal. The craniocervical junction is normal. The upper cervical spine is unremarkable. Midline sagittal structures are otherwise within normal limits. Sinuses/Orbits: Mucosal thickening and polyps are present in the maxillary sinuses, right greater than left. The remaining paranasal sinuses and right mastoid air cells are clear. IMPRESSION: 1. Two 8 mm enhancing lesions compatible with metastases in the anterior right frontal lobe and left occipital lobe. 2. The mild sinus disease. Electronically Signed   By: San Morelle M.D.   On: 02/14/2017 17:19   Nm Pet Image Restag (ps) Skull Base To Thigh  Result Date: 02/14/2017 CLINICAL DATA:  Subsequent treatment strategy for cervical cancer. EXAM: NUCLEAR  MEDICINE PET SKULL BASE TO THIGH TECHNIQUE: 7.7 MCi F-18 FDG was injected intravenously. Full-ring PET imaging was performed from the skull base to thigh after the radiotracer. CT data was obtained and used for attenuation correction and anatomic localization. FASTING BLOOD GLUCOSE:  Value: 108 mg/dl COMPARISON:  09/03/2016 FINDINGS: NECK Interval development of hypermetabolic lymph nodes in the supraclavicular region bilaterally. New 11 mm left supraclavicular lymph node (image 39 series 4) demonstrates SUV max = 5.9. CHEST Interval progression of hypermetabolic left axillary lymphadenopathy. Mediastinal and hilar lymphadenopathy has progressed in the interval with new lymph nodes evident in previous lymph nodes enlarging in the interval. 13 mm short axis high right paratracheal lymph node (image 46 series 4) was 5 mm short axis previously and today demonstrates SUV max = 10.5. A new hypermetabolic right infrahilar lymph node demonstrates SUV max = 9.3. Interval development of multiple bilateral pulmonary nodules, too numerous to count. 1.5 cm anterior left upper lobe pulmonary nodule (image 20 series 8) is new since prior study and demonstrates SUV max = 11.4. 3.2 cm hypermetabolic nodule in the right costophrenic sulcus is new in the interval. There is confluent hypermetabolic airspace disease in the anterior left lower lobe. Small left pleural effusion is new in the interval. Low level uptake again identified in the right breast lesion containing localization clip. ABDOMEN/PELVIS No abnormal hypermetabolic activity within the liver, pancreas, or spleen. A new 1.8 cm right adrenal nodule is hypermetabolic with SUV max = 9.9. A new 7 mm short axis left para-aortic lymph node (image 123 series 4) is hypermetabolic with SUV max = 5.3. Hypermetabolic small aortocaval lymph node is identified at the same level. Several hypermetabolic foci are seen scattered along the small bowel/mesentery, but choroid live lesions  cannot be identified on the noncontrast CT images. Cholelithiasis again noted. Abdominal aortic atherosclerosis evident without aneurysm. Small volume intraperitoneal free fluid. SKELETON No focal hypermetabolic activity to suggest skeletal metastasis. IMPRESSION: 1. Interval development of new and interval progression of the existing mediastinal and hilar lymphadenopathy. 2. Interval development of hypermetabolic supraclavicular nodal metastases bilaterally and progression of hypermetabolic left axillary lymphadenopathy. 3. Interval development of numerous bilateral pulmonary nodules of varying sizes, many of which are markedly hypermetabolic. Imaging features are consistent with progression of pulmonary metastases. An area of confluent airspace consolidation in the anterior left lower lobe is hypermetabolic and may represent tumor  or combination of tumor and infection/inflammation. 4. New small left pleural effusion. 5. Interval development of hypermetabolic right adrenal metastasis. 6. Interval development of small hypermetabolic retroperitoneal lymph nodes in the abdomen, suspicious for metastatic disease. Electronically Signed   By: Misty Stanley M.D.   On: 02/14/2017 16:29   US Biopsy  Result Date: 02/17/2017 INDICATION: History of cervical cancer. Patient has multiple pulmonary nodules and lymphadenopathy. Plan for ultrasound-guided biopsy of a left supraclavicular lymph node for tissue diagnosis. EXAM: ULTRASOUND-GUIDED CORE BIOPSY OF LEFT SUPRACLAVICULAR LYMPH NODE MEDICATIONS: None. ANESTHESIA/SEDATION: Moderate (conscious) sedation was employed during this procedure. A total of Versed 2.0 mg and Fentanyl 50 mcg was administered intravenously. Moderate Sedation Time: 15 minutes. The patient's level of consciousness and vital signs were monitored continuously by radiology nursing throughout the procedure under my direct supervision. FLUOROSCOPY TIME:  None COMPLICATIONS: None immediate. PROCEDURE:  Informed written consent was obtained from the patient after a thorough discussion of the procedural risks, benefits and alternatives. All questions were addressed. A timeout was performed prior to the initiation of the procedure. Left side of the neck was evaluated with ultrasound. Enlarged left supraclavicular lymph node was targeted for biopsy. The left side of the neck was prepped with chlorhexidine and sterile field was created. Skin was anesthetized with 1% lidocaine. Using ultrasound guidance, core biopsies were obtained of a left supraclavicular lymph node with an 18 gauge core device. Four samples placed in formalin and two samples placed in saline. Bandage placed over the puncture site. FINDINGS: Enlarged hypoechoic lymph nodes in left supraclavicular area. These lymph nodes are rounded and appear abnormal. Core biopsy needle identified within the supraclavicular lymph node on all occasions. IMPRESSION: Ultrasound-guided core biopsies of the left supraclavicular lymph node. Electronically Signed   By: Markus Daft M.D.   On: 02/17/2017 15:04      IMPRESSION/PLAN: 1. 61 y.o. 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 Today, we talked to the patient about the findings and work-up thus far.  Dr. Tammi Klippel reviewed the imaging studies with the patiernt in detail. We discussed the natural history of metastatic SCC with brain metastasis and general treatment, highlighting the role of stereotactic radiosurgery [facility]) in the management.  We discussed the available radiation techniques, and focused on the details of logistics and delivery.  We reviewed the anticipated acute and late sequelae associated with stereotactic radiation in this setting.  The patient was encouraged  to ask questions that we answered to the best of our ability. The patient would like to proceed with stereotactic radiosurgery. She has been scheduled for CT simulation on 02/21/2017 at 2 pm, scheduled for 3T MRI brain with SRS protocol on 02/21/2017 at 4 pm, and scheduled for [facility] treatment on 02/28/2017. She will meet with Dr. Vertell Limber, neurosurgery, on 02/26/17. The patient has a scheduled follow up with Dr. Alvy Bimler tomorrow.       The above documentation reflects our direct findings during this shared patient visit.     Nicholos Johns, PA-C    Tyler Pita, MD  [facility] Direct Dial: [phone]  Fax: [phone] [email or web address]  Skype  [email or web address]  This document serves as a record of services personally performed by Tyler Pita, MD and Freeman Caldron, PA-C. It was  created on their behalf by Arlyce Harman, a trained medical scribe. The creation of this record is based on the scribe's personal observations and the provider's statements to them. This document has been checked and approved by the attending provider.

## 2017-02-19 NOTE — Progress Notes (Signed)
See progress note under physician encounter. 

## 2017-02-19 NOTE — Progress Notes (Addendum)
Location/Histology of Brain Tumor: Two 8 mm enhancing lesions compatible with metastases in the anterior right frontal lobe and left occipital lobe.  Patient presented with symptoms of:  patient presented with sensation of fatigue, dizziness, lightheadedness, nasal congestion, and poor appetite. Also, CT angiogram proved extensive, progressive disease within her mediastinum thus, MRI was ordered for workup.  Past or anticipated interventions, if any, per neurosurgery: no  Past or anticipated interventions, if any, per medical oncology:  She received chemotherapy initially with paclitaxel, developed allergic reaction. Regimen was changed to CDDP gemzar, cycle 1 given 10-06-15 and cycle 2 on 10-19-15. Case was reviewed at multidisciplinary conference, with repeat CT chest favoring reactive nodes over metastatic disease. Decision was made to give definitive radiation with sensitizing CDDP. She had 5 weekly cycles of CDDP from 11-02-15 thru 12-04-15 with IMRT    Dose of Decadron, if applicable: No  Recent neurologic symptoms, if any:   Seizures: no  Headaches: Yes. Reports intermittent frontal headaches. Reports taking Tylenol and resting typically work to resolve headache.  Nausea: Nausea but, no vomiting. Reports poor appetite and weight loss x 1 month. Occasionally drinks Carnation instant breakfast to supplement her diet.  Dizziness/ataxia: Yes. Intermittently worse when she rises from a sitting to standing position.  Difficulty with hand coordination: no  Focal numbness/weakness: Yes. Reports with prolonged standing from her knee to her ankle feels numb.   Visual deficits/changes: Patient wears glasses. Reports intermittent she sees "wavy lines" in her visual field.   Confusion/Memory deficits: no  Painful bone metastases at present, if any: No  Reports for several weeks she has had intermittent left side pain just above her left hip pain that radiates anterior to posterior.    SAFETY ISSUES:  Prior radiation? Yes   11/09/2015 - 12/21/2015 Radiation Therapy    Pelvis/paraaortic,32.4 Gy in 18 fractions Pelvis/paraaortic boost,5.4 Gy in 3 fractions Paraaortic boost,12.6 Gy in 7 fractions       12/26/2015 - 01/25/2016 Radiation Therapy    12/26/2015, 01/04/2016, 01/11/2016, 01/17/2016, 01/25/2016: The cervical area was boosted with 5 brachytherapy treatments using iridium 192 as the high-dose-rate source, the cervical region received 5.5 gray each of her 5 brachytherapy treatments.     Pacemaker/ICD? no  Possible current pregnancy? no  Is the patient on methotrexate? no  Additional Complaints / other details: 61 year old female. Reports she gets SOB more easily. Scheduled to follow up with Dr. Alvy Bimler tomorrow. Reports feeling run down x 1 month. Was prescribed Ativan 0.5 mg prn by Dr. Alvy Bimler for MRI.

## 2017-02-20 ENCOUNTER — Telehealth: Payer: Self-pay | Admitting: Hematology and Oncology

## 2017-02-20 ENCOUNTER — Telehealth: Payer: Self-pay

## 2017-02-20 ENCOUNTER — Encounter: Payer: Self-pay | Admitting: Hematology and Oncology

## 2017-02-20 ENCOUNTER — Other Ambulatory Visit: Payer: Self-pay | Admitting: *Deleted

## 2017-02-20 ENCOUNTER — Ambulatory Visit (HOSPITAL_BASED_OUTPATIENT_CLINIC_OR_DEPARTMENT_OTHER): Payer: BLUE CROSS/BLUE SHIELD | Admitting: Hematology and Oncology

## 2017-02-20 ENCOUNTER — Other Ambulatory Visit: Payer: BLUE CROSS/BLUE SHIELD

## 2017-02-20 VITALS — BP 129/78 | HR 110 | Temp 98.3°F | Resp 20 | Wt 132.4 lb

## 2017-02-20 DIAGNOSIS — Z7189 Other specified counseling: Secondary | ICD-10-CM

## 2017-02-20 DIAGNOSIS — D630 Anemia in neoplastic disease: Secondary | ICD-10-CM | POA: Diagnosis not present

## 2017-02-20 DIAGNOSIS — R5383 Other fatigue: Secondary | ICD-10-CM

## 2017-02-20 DIAGNOSIS — C7931 Secondary malignant neoplasm of brain: Secondary | ICD-10-CM | POA: Diagnosis not present

## 2017-02-20 DIAGNOSIS — C778 Secondary and unspecified malignant neoplasm of lymph nodes of multiple regions: Secondary | ICD-10-CM

## 2017-02-20 DIAGNOSIS — G893 Neoplasm related pain (acute) (chronic): Secondary | ICD-10-CM

## 2017-02-20 DIAGNOSIS — R5381 Other malaise: Secondary | ICD-10-CM

## 2017-02-20 DIAGNOSIS — E46 Unspecified protein-calorie malnutrition: Secondary | ICD-10-CM | POA: Diagnosis not present

## 2017-02-20 DIAGNOSIS — C539 Malignant neoplasm of cervix uteri, unspecified: Secondary | ICD-10-CM

## 2017-02-20 DIAGNOSIS — C7800 Secondary malignant neoplasm of unspecified lung: Secondary | ICD-10-CM | POA: Diagnosis not present

## 2017-02-20 DIAGNOSIS — R110 Nausea: Secondary | ICD-10-CM

## 2017-02-20 DIAGNOSIS — E440 Moderate protein-calorie malnutrition: Secondary | ICD-10-CM

## 2017-02-20 DIAGNOSIS — K5909 Other constipation: Secondary | ICD-10-CM

## 2017-02-20 DIAGNOSIS — Z720 Tobacco use: Secondary | ICD-10-CM

## 2017-02-20 MED ORDER — DEXAMETHASONE 4 MG PO TABS: 4.0000 mg | ORAL_TABLET | Freq: Two times a day (BID) | ORAL | 0 refills | Status: AC

## 2017-02-20 MED ORDER — HYDROMORPHONE HCL 2 MG PO TABS: 2.0000 mg | ORAL_TABLET | ORAL | 0 refills | Status: AC | PRN

## 2017-02-20 MED ORDER — HYDROMORPHONE HCL 2 MG PO TABS: 2.0000 mg | ORAL_TABLET | ORAL | 0 refills | Status: DC | PRN

## 2017-02-20 MED ORDER — PROCHLORPERAZINE MALEATE 10 MG PO TABS: 10.0000 mg | ORAL_TABLET | Freq: Four times a day (QID) | ORAL | 3 refills | Status: AC | PRN

## 2017-02-20 MED ORDER — ONDANSETRON HCL 8 MG PO TABS: 8.0000 mg | ORAL_TABLET | Freq: Three times a day (TID) | ORAL | 3 refills | Status: AC | PRN

## 2017-02-20 NOTE — Telephone Encounter (Signed)
Appointments scheduled per 3/1 LOS.  Patient given AVS report and calendars with future scheduled appointments.

## 2017-02-20 NOTE — Telephone Encounter (Signed)
Called to add MSI testing to tissue sample on 02-17-17, [id number].

## 2017-02-20 NOTE — Progress Notes (Signed)
  Radiation Oncology         [phone] ________________________________  Name: Valerie Figueroa MRN: [id number]  Date: 02/21/2017  DOB: 05-07-1956  SIMULATION AND TREATMENT PLANNING NOTE    ICD-9-CM ICD-10-CM   1. 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   NARRATIVE:  The patient was brought to the [facility].  Identity was confirmed.  All relevant records and images related to the planned course of therapy were reviewed.  The patient freely provided informed written consent to proceed with treatment after reviewing the details related to the planned course of therapy. The consent form was witnessed and verified by the simulation staff. Intravenous access was established for contrast administration. Then, the patient was set-up in a stable reproducible supine position for radiation therapy.  A relocatable thermoplastic stereotactic head frame was fabricated for precise immobilization.  CT images were obtained.  Surface markings were placed.  The CT images were loaded into the planning software and fused with the patient's targeting MRI scan.  Then the target and avoidance structures were contoured.  Treatment planning then occurred.  The radiation prescription was entered and confirmed.  I have requested 3D planning  I have requested a DVH of the following structures: Brain stem, brain, left eye, right eye, lenses, optic chiasm, target volumes, uninvolved brain, and normal tissue.    SPECIAL TREATMENT PROCEDURE:  The planned course of therapy using radiation constitutes a special treatment procedure. Special care is required in the management of this patient for the following reasons. This treatment constitutes a Special Treatment Procedure for the following reason: High dose per fraction requiring special monitoring for increased  toxicities of treatment including daily imaging.  The special nature of the planned course of radiotherapy will require increased physician supervision and oversight to ensure patient's safety with optimal treatment outcomes.  PLAN:  The patient will receive 20 Gy in 1 fraction.  ________________________________  Sheral Apley Tammi Klippel, M.D.

## 2017-02-20 NOTE — Addendum Note (Signed)
Encounter addended by: Heywood Footman, RN on: 02/20/2017  2:24 PM<BR>    Actions taken: Charge Capture section accepted

## 2017-02-20 NOTE — Patient Instructions (Addendum)
Steps to Quit Smoking Smoking tobacco can be harmful to your health and can affect almost every organ in your body. Smoking puts you, and those around you, at risk for developing many serious chronic diseases. Quitting smoking is difficult, but it is one of the best things that you can do for your health. It is never too late to quit. What are the benefits of quitting smoking? When you quit smoking, you lower your risk of developing serious diseases and conditions, such as:  Lung cancer or lung disease, such as COPD.  Heart disease.  Stroke.  Heart attack.  Infertility.  Osteoporosis and bone fractures. Additionally, symptoms such as coughing, wheezing, and shortness of breath may get better when you quit. You may also find that you get sick less often because your body is stronger at fighting off colds and infections. If you are pregnant, quitting smoking can help to reduce your chances of having a baby of low birth weight. How do I get ready to quit? When you decide to quit smoking, create a plan to make sure that you are successful. Before you quit:  Pick a date to quit. Set a date within the next two weeks to give you time to prepare.  Write down the reasons why you are quitting. Keep this list in places where you will see it often, such as on your bathroom mirror or in your car or wallet.  Identify the people, places, things, and activities that make you want to smoke (triggers) and avoid them. Make sure to take these actions:  Throw away all cigarettes at home, at work, and in your car.  Throw away smoking accessories, such as Scientist, research (medical).  Clean your car and make sure to empty the ashtray.  Clean your home, including curtains and carpets.  Tell your family, friends, and coworkers that you are quitting. Support from your loved ones can make quitting easier.  Talk with your health care provider about your options for quitting smoking.  Find out what treatment  options are covered by your health insurance. What strategies can I use to quit smoking? Talk with your healthcare provider about different strategies to quit smoking. Some strategies include:  Quitting smoking altogether instead of gradually lessening how much you smoke over a period of time. Research shows that quitting "cold [place]" is more successful than gradually quitting.  Attending in-person counseling to help you build problem-solving skills. You are more likely to have success in quitting if you attend several counseling sessions. Even short sessions of 10 minutes can be effective.  Finding resources and support systems that can help you to quit smoking and remain smoke-free after you quit. These resources are most helpful when you use them often. They can include:  Online chats with a Social worker.  Telephone quitlines.  Printed Furniture conservator/restorer.  Support groups or group counseling.  Text messaging programs.  Mobile phone applications.  Taking medicines to help you quit smoking. (If you are pregnant or breastfeeding, talk with your health care provider first.) Some medicines contain nicotine and some do not. Both types of medicines help with cravings, but the medicines that include nicotine help to relieve withdrawal symptoms. Your health care provider may recommend:  Nicotine patches, gum, or lozenges.  Nicotine inhalers or sprays.  Non-nicotine medicine that is taken by mouth. Talk with your health care provider about combining strategies, such as taking medicines while you are also receiving in-person counseling. Using these two strategies together makes you more  likely to succeed in quitting than if you used either strategy on its own. If you are pregnant or breastfeeding, talk with your health care provider about finding counseling or other support strategies to quit smoking. Do not take medicine to help you quit smoking unless told to do so by your health care  provider. What things can I do to make it easier to quit? Quitting smoking might feel overwhelming at first, but there is a lot that you can do to make it easier. Take these important actions:  Reach out to your family and friends and ask that they support and encourage you during this time. Call telephone quitlines, reach out to support groups, or work with a counselor for support.  Ask people who smoke to avoid smoking around you.  Avoid places that trigger you to smoke, such as bars, parties, or smoke-break areas at work.  Spend time around people who do not smoke.  Lessen stress in your life, because stress can be a smoking trigger for some people. To lessen stress, try:  Exercising regularly.  Deep-breathing exercises.  Yoga.  Meditating.  Performing a body scan. This involves closing your eyes, scanning your body from head to toe, and noticing which parts of your body are particularly tense. Purposefully relax the muscles in those areas.  Download or purchase mobile phone or tablet apps (applications) that can help you stick to your quit plan by providing reminders, tips, and encouragement. There are many free apps, such as QuitGuide from the [employer] Office manager for Disease Control and Prevention). You can find other support for quitting smoking (smoking cessation) through smokefree.gov and other websites. How will I feel when I quit smoking? Within the first 24 hours of quitting smoking, you may start to feel some withdrawal symptoms. These symptoms are usually most noticeable 2-3 days after quitting, but they usually do not last beyond 2-3 weeks. Changes or symptoms that you might experience include:  Mood swings.  Restlessness, anxiety, or irritation.  Difficulty concentrating.  Dizziness.  Strong cravings for sugary foods in addition to nicotine.  Mild weight gain.  Constipation.  Nausea.  Coughing or a sore throat.  Changes in how your medicines work in your  body.  A depressed mood.  Difficulty sleeping (insomnia). After the first 2-3 weeks of quitting, you may start to notice more positive results, such as:  Improved sense of smell and taste.  Decreased coughing and sore throat.  Slower heart rate.  Lower blood pressure.  Clearer skin.  The ability to breathe more easily.  Fewer sick days. Quitting smoking is very challenging for most people. Do not get discouraged if you are not successful the first time. Some people need to make many attempts to quit before they achieve long-term success. Do your best to stick to your quit plan, and talk with your health care provider if you have any questions or concerns. This information is not intended to replace advice given to you by your health care provider. Make sure you discuss any questions you have with your health care provider. Document Released: 12/03/2001 Document Revised: 08/06/2016 Document Reviewed: 04/25/2015 Elsevier Interactive Patient Education  2017 [place]. Carboplatin injection What is this medicine? CARBOPLATIN (KAR boe pla tin) is a chemotherapy drug. It targets fast dividing cells, like cancer cells, and causes these cells to die. This medicine is used to treat ovarian cancer and many other cancers. This medicine may be used for other purposes; ask your health care provider or pharmacist  if you have questions. COMMON BRAND NAME(S): Paraplatin What should I tell my health care provider before I take this medicine? They need to know if you have any of these conditions: -blood disorders -hearing problems -kidney disease -recent or ongoing radiation therapy -an unusual or allergic reaction to carboplatin, cisplatin, other chemotherapy, other medicines, foods, dyes, or preservatives -pregnant or trying to get pregnant -breast-feeding How should I use this medicine? This drug is usually given as an infusion into a vein. It is administered in a hospital or clinic by a  specially trained health care professional. Talk to your pediatrician regarding the use of this medicine in children. Special care may be needed. Overdosage: If you think you have taken too much of this medicine contact a poison control center or emergency room at once. NOTE: This medicine is only for you. Do not share this medicine with others. What if I miss a dose? It is important not to miss a dose. Call your doctor or health care professional if you are unable to keep an appointment. What may interact with this medicine? -medicines for seizures -medicines to increase blood counts like filgrastim, pegfilgrastim, sargramostim -some antibiotics like amikacin, gentamicin, neomycin, streptomycin, tobramycin -vaccines Talk to your doctor or health care professional before taking any of these medicines: -acetaminophen -aspirin -ibuprofen -ketoprofen -naproxen This list may not describe all possible interactions. Give your health care provider a list of all the medicines, herbs, non-prescription drugs, or dietary supplements you use. Also tell them if you smoke, drink alcohol, or use illegal drugs. Some items may interact with your medicine. What should I watch for while using this medicine? Your condition will be monitored carefully while you are receiving this medicine. You will need important blood work done while you are taking this medicine. This drug may make you feel generally unwell. This is not uncommon, as chemotherapy can affect healthy cells as well as cancer cells. Report any side effects. Continue your course of treatment even though you feel ill unless your doctor tells you to stop. In some cases, you may be given additional medicines to help with side effects. Follow all directions for their use. Call your doctor or health care professional for advice if you get a fever, chills or sore throat, or other symptoms of a cold or flu. Do not treat yourself. This drug decreases your  body's ability to fight infections. Try to avoid being around people who are sick. This medicine may increase your risk to bruise or bleed. Call your doctor or health care professional if you notice any unusual bleeding. Be careful brushing and flossing your teeth or using a toothpick because you may get an infection or bleed more easily. If you have any dental work done, tell your dentist you are receiving this medicine. Avoid taking products that contain aspirin, acetaminophen, ibuprofen, naproxen, or ketoprofen unless instructed by your doctor. These medicines may hide a fever. Do not become pregnant while taking this medicine. Women should inform their doctor if they wish to become pregnant or think they might be pregnant. There is a potential for serious side effects to an unborn child. Talk to your health care professional or pharmacist for more information. Do not breast-feed an infant while taking this medicine. What side effects may I notice from receiving this medicine? Side effects that you should report to your doctor or health care professional as soon as possible: -allergic reactions like skin rash, itching or hives, swelling of the face, lips, or tongue -  signs of infection - fever or chills, cough, sore throat, pain or difficulty passing urine -signs of decreased platelets or bleeding - bruising, pinpoint red spots on the skin, black, tarry stools, nosebleeds -signs of decreased red blood cells - unusually weak or tired, fainting spells, lightheadedness -breathing problems -changes in hearing -changes in vision -chest pain -high blood pressure -low blood counts - This drug may decrease the number of white blood cells, red blood cells and platelets. You may be at increased risk for infections and bleeding. -nausea and vomiting -pain, swelling, redness or irritation at the injection site -pain, tingling, numbness in the hands or feet -problems with balance, talking, walking -trouble  passing urine or change in the amount of urine Side effects that usually do not require medical attention (report to your doctor or health care professional if they continue or are bothersome): -hair loss -loss of appetite -metallic taste in the mouth or changes in taste This list may not describe all possible side effects. Call your doctor for medical advice about side effects. You may report side effects to FDA at 1-800-FDA-1088. Where should I keep my medicine? This drug is given in a hospital or clinic and will not be stored at home. NOTE: This sheet is a summary. It may not cover all possible information. If you have questions about this medicine, talk to your doctor, pharmacist, or health care provider.  2018 Elsevier/Gold Standard (2008-03-15 14:38:05)

## 2017-02-20 NOTE — Progress Notes (Signed)
Patient on plan of care prior to pathways. 

## 2017-02-20 NOTE — Progress Notes (Signed)
START OFF PATHWAY REGIMEN - [Other Dx]   [id number]:Carboplatin AUC=5 q21 Days:   A cycle is every 21 days:     Carboplatin        Dose Mod: None  **Always confirm dose/schedule in your pharmacy ordering system**    Intent of Therapy: Non-Curative / Palliative Intent, Discussed with Patient

## 2017-02-21 ENCOUNTER — Ambulatory Visit
Admission: RE | Admit: 2017-02-21 | Discharge: 2017-02-21 | Disposition: A | Discharge status: Home / Self Care | Payer: BLUE CROSS/BLUE SHIELD | Source: Ambulatory Visit | Attending: Radiation Oncology | Admitting: Radiation Oncology

## 2017-02-21 ENCOUNTER — Encounter: Payer: Self-pay | Admitting: Radiation Therapy

## 2017-02-21 ENCOUNTER — Encounter: Payer: Self-pay | Admitting: Hematology and Oncology

## 2017-02-21 VITALS — BP 138/90 | HR 100 | Temp 99.5°F

## 2017-02-21 DIAGNOSIS — G893 Neoplasm related pain (acute) (chronic): Secondary | ICD-10-CM | POA: Insufficient documentation

## 2017-02-21 DIAGNOSIS — C7949 Secondary malignant neoplasm of other parts of nervous system: Principal | ICD-10-CM

## 2017-02-21 DIAGNOSIS — Z510 Encounter for antineoplastic radiation therapy: Secondary | ICD-10-CM | POA: Diagnosis not present

## 2017-02-21 DIAGNOSIS — R5381 Other malaise: Secondary | ICD-10-CM | POA: Insufficient documentation

## 2017-02-21 DIAGNOSIS — Z7189 Other specified counseling: Secondary | ICD-10-CM | POA: Insufficient documentation

## 2017-02-21 DIAGNOSIS — K5909 Other constipation: Secondary | ICD-10-CM | POA: Insufficient documentation

## 2017-02-21 DIAGNOSIS — C7931 Secondary malignant neoplasm of brain: Secondary | ICD-10-CM

## 2017-02-21 DIAGNOSIS — R110 Nausea: Secondary | ICD-10-CM | POA: Insufficient documentation

## 2017-02-21 DIAGNOSIS — D630 Anemia in neoplastic disease: Secondary | ICD-10-CM | POA: Insufficient documentation

## 2017-02-21 DIAGNOSIS — E440 Moderate protein-calorie malnutrition: Secondary | ICD-10-CM | POA: Insufficient documentation

## 2017-02-21 MED ORDER — SODIUM CHLORIDE 0.9 % IJ SOLN
10.0000 mL | Freq: Once | INTRAMUSCULAR | Status: AC
  Administered 2017-02-21: 10 mL via INTRAVENOUS

## 2017-02-21 MED ORDER — GADOBENATE DIMEGLUMINE 529 MG/ML IV SOLN
12.0000 mL | Freq: Once | INTRAVENOUS | Status: AC | PRN
  Administered 2017-02-21: 12 mL via INTRAVENOUS

## 2017-02-21 NOTE — Assessment & Plan Note (Signed)
The cause is unknown but could be due to heavy burden of disease. I plan to start her on dexamethasone along with anti-emetics to take as needed.

## 2017-02-21 NOTE — Progress Notes (Signed)
Has armband been applied?  Yes.    Does patient have an allergy to IV contrast dye?: No.   Has patient ever received premedication for IV contrast dye?: No.   Does patient take metformin?: No.  If patient does take metformin when was the last dose: n/a  Date of lab work: February 17, 2017 BUN: 10 CR: 0.6  IV site: wrist right, condition patent  Has IV site been added to flowsheet?  Yes.  IV to be removed by [facility] after MRI per Mont Dutton, Navigator.  BP 138/90 (BP Location: Right Arm, Patient Position: Sitting)   Pulse 100   Temp 99.5 F (37.5 C) (Oral)   LMP 08/25/2015   SpO2 95%

## 2017-02-21 NOTE — Progress Notes (Signed)
The patient was offered enrollment on our single institutional trial investigating open-faced vs closed-face head masks used to immobilize patients during stereotactic brain radiosurgery. The patient has elected to enroll on this trial.  In regards to the trial, the patient has voluntarily signed copies of the consent forms and all trial related questions were answered.  #27 - randomized to have the [place] was filled out and given to Albertha Ghee R.T.(R)(T) Special Procedures

## 2017-02-21 NOTE — Assessment & Plan Note (Signed)
She had mild recent constipation. We discussed chronic laxative therapy

## 2017-02-21 NOTE — Assessment & Plan Note (Signed)
She has cancer pain in her upper back region, likely due to involvement of the pleural area. I recommend low-dose Dilaudid to take as needed. I did warn her about risk of sedation and constipation. We discussed narcotic refill policy

## 2017-02-21 NOTE — Progress Notes (Signed)
[place] OFFICE PROGRESS NOTE  Patient Care Team: [name], MD as PCP - General (Obstetrics and Gynecology)  SUMMARY OF ONCOLOGIC HISTORY:   Cervix cancer [facility]) (Resolved)   08/16/2015 Initial Diagnosis    Cervix cancer       Malignant neoplasm of cervix ([place])   08/08/2015 Initial Diagnosis    Patient has had no regular medical care in years, including no PAP in >15 years. She had no bleeding since menopause until several hours of heavy vaginal bleeding in 04-2015, which resolved other than intermittent spotting until extremely heavy vaginal bleeding again 08-08-15.      08/16/2015 Pathology Results    Cervix, biopsy - SQUAMOUS CELL CARCINOMA. Microscopic Comment The specimens are involved by invasive squamous cell carcinoma, moderately to poorly differentiated with focal basaloid features.      08/16/2015 Miscellaneous    She was seen in [facility] ED then, with Hgb 8.0; she was given premarin and continued on oral megace x 2 weeks. She was seen by Dr Mallory Shirk on 08-16-15 , with finding of large fungating cervical mass with parametrial involvement      08/21/2015 Imaging    Large irregular mass involving the cervix and lower uterine segment compatible with recently biopsied cervical malignancy. There is associated dilatation of the endometrial canal. Multiple enlarged retroperitoneal and pelvic lymph nodes concerning for metastatic adenopathy. Abnormal enhancement of the urothelium involving the right renal collecting system and proximal right ureter raising the possibility of an infectious process. Recommend correlation with urinalysis. Posterior to the right kidney there is irregular soft tissue density material which may represent a small amount of infectious/inflammatory stranding of the perinephric fat. Irregular nodular opacity within the right lower lobe measuring up to 2.7 cm may be secondary to an infectious or inflammatory process however is concerning  for metastatic disease in the setting of known cervical malignancy or possible primary pulmonary malignancy. Multiple additional 2-3 mm nodules within the lungs bilaterally may be infectious or inflammatory in etiology or potentially secondary to metastatic disease. 10 mm sclerotic focus within the lateral left fifth rib may represent a bone island or osseous metastasis. Oval mass within the upper-outer quadrant of the right breast. Recommend dedicated evaluation with mammography if not previously Evaluated.       08/31/2015 - 09/04/2015 Radiation Therapy    Pelvis 9 Gy in 3 fxs for initial vaginal bleeding issues      09/06/2015 PET scan    Hypermetabolic cervical mass with hypermetabolic abdominal and peritoneal retroperitoneal adenopathy, consistent with the given history of cervical carcinoma. 2. Hypermetabolic left supraclavicular and left axillary lymph nodes. Left axillary lymph nodes are new from 08/21/2015, suggesting an infectious or inflammatory etiology. Attention on followup exams is warranted. 3. Mildly hypermetabolic lateral right breast nodule. Patient underwent mammography and ultrasound on 09/01/2015. 4. Coronary artery calcification. 5. Cholelithiasis.      09/08/2015 Pathology Results    Breast, right, needle core biopsy, upper outer quadrant at 10:30 o'clock - FIBROADENOMA WITH CALCIFICATIONS. - THERE IS NO EVIDENCE OF MALIGNANCY. - SEE COMMENT. Microscopic Comment There results were called to the [place] on 09/11/2015.      09/22/2015 - 12/04/2015 Chemotherapy    She received chemotherapy initially with paclitaxel, developed allergic reaction. Regimen was changed to CDDP gemzar, cycle 1 given 10-06-15 and cycle 2 on 10-19-15. Case was reviewed at multidisciplinary conference, with repeat CT chest favoring reactive nodes over metastatic disease. Decision was made to give  definitive radiation with sensitizing CDDP. She had 5 weekly cycles of CDDP from 11-02-15  thru 12-04-15 with IMRT      10/24/2015 Imaging    Minimally decreased prominence of sub-cm left axillary lymph nodes, and stable sub-cm left supraclavicular lymph nodes. This suggests a resolving reactive or inflammatory etiology, with metastatic disease considered less likely. No definite evidence of metastatic disease or other acute findings within the thorax.      11/09/2015 - 12/21/2015 Radiation Therapy    Pelvis/paraaortic, 32.4 Gy in 18 fractions Pelvis/paraaortic boost, 5.4 Gy in 3 fractions Paraaortic boost, 12.6 Gy in 7 fractions       12/26/2015 - 01/25/2016 Radiation Therapy    12/26/2015, 01/04/2016, 01/11/2016, 01/17/2016, 01/25/2016: The cervical area was boosted with 5 brachytherapy treatments using iridium 192 as the high-dose-rate source, the cervical region received 5.5 gray each of her 5 brachytherapy treatments.      03/21/2016 Imaging    Interval response to therapy. There has been resolution of previous surgical mass. Abdominal and pelvic adenopathy has also resolved in the interval. 2. Residual trace ascites identified within the right pericolic gutter an dependent portion of pelvis. 3. Aortic atherosclerosis. 4. Gallstones.      06/19/2016 Pathology Results    NEGATIVE FOR INTRAEPITHELIAL LESIONS OR MALIGNANCY. BENIGN REACTIVE/REPARATIVE CHANGES.      08/14/2016 Imaging    Interval development of enlarged mediastinal lymph nodes worrisome for metastatic adenopathy. 2. New right lower lobe pulmonary nodule. 3. No evidence for recurrent adenopathy within the abdomen or pelvis. 4. Gallstones 5. Aortic atherosclerosis and multi vessel coronary artery calcification.      08/14/2016 Miscellaneous    Ophthalmlogy exam 07-2016 question of right choroidal met. CT head was negative      09/03/2016 PET scan    Enlarged Hypermetabolic mediastinal hilar lymph nodes consistent with metastatic adenopathy. 2. Hypermetabolic LEFT axillary lymph node is indeterminate. Normal morphology. 3.  Low metabolic activity of the larger RIGHT lower lobe pulmonary nodule favors benign etiology. Smaller RIGHT lobe nodules too small to characterize 4. Small RIGHT breast mass with biopsy clip and low metabolic activity not changed from prior. 5. No evidence of local recurrence or metastasis within the abdomen or pelvis.      01/27/2017 Imaging    No evidence of pulmonary embolus. Aortic atherosclerosis. Extensive mediastinal adenopathy as well as bilateral pulmonary nodules are noted consistent with metastatic disease. Left axillary and bilateral supraclavicular adenopathy is noted as well.       02/14/2017 PET scan    Interval development of new and interval progression of the existing mediastinal and hilar lymphadenopathy. 2. Interval development of hypermetabolic supraclavicular nodal metastases bilaterally and progression of hypermetabolic left axillary lymphadenopathy. 3. Interval development of numerous bilateral pulmonary nodules of varying sizes, many of which are markedly hypermetabolic. Imaging features are consistent with progression of pulmonary metastases. An area of confluent airspace consolidation in the anterior left lower lobe is hypermetabolic and may represent tumor or combination of tumor and infection/inflammation. 4. New small left pleural effusion. 5. Interval development of hypermetabolic right adrenal metastasis. 6. Interval development of small hypermetabolic retroperitoneal lymph nodes in the abdomen, suspicious for metastatic disease.      02/14/2017 Imaging    Two 8 mm enhancing lesions compatible with metastases in the anterior right frontal lobe and left occipital lobe. 2. The mild sinus disease.      02/17/2017 Procedure    Ultrasound-guided core biopsies of the left supraclavicular lymph node  INTERVAL HISTORY: Please see below for problem oriented charting. She returns today with her daughter in law. She tolerated recent biopsy well.  Apart from minor  bruising, she denies bleeding. She have a lot of symptoms today.  She complained of feeling weak, nauseated, with recent weight loss, mild lower back pain, and constipation. She denies dizziness, lightheadedness, headaches or new neurological deficits. She is still smoking but is attempting to quit.  She is down to approximately less than 5 cigarettes per day She was referred urgently to see radiation oncologist.  The plan is for her to undergo SRS therapy to brain metastasis. In terms of her lower back pain, she rated it at about 2-3 out of 10.  She has been taking some Tylenol.  With the nausea, she denies recent vomiting. She denies chest pain or shortness of breath.  REVIEW OF SYSTEMS:   Constitutional: Denies fevers, chills  Eyes: Denies blurriness of vision Ears, nose, mouth, throat, and face: Denies mucositis or sore throat Respiratory: Denies cough, dyspnea or wheezes Cardiovascular: Denies palpitation, chest discomfort or lower extremity swelling Skin: Denies abnormal skin rashes Lymphatics: Denies new lymphadenopathy or easy bruising Neurological:Denies numbness, tingling or new weaknesses Behavioral/Psych: Mood is stable, no new changes  All other systems were reviewed with the patient and are negative.  I have reviewed the past medical history, past surgical history, social history and family history with the patient and they are unchanged from previous note.  ALLERGIES:  is allergic to paclitaxel.  MEDICATIONS:  Current Outpatient Prescriptions  Medication Sig Dispense Refill  . acetaminophen (TYLENOL) 500 MG tablet Take 1,000 mg by mouth every 8 (eight) hours as needed for mild pain. Reported on 02/28/2016    . Multiple Vitamins-Minerals (CENTRUM ULTRA WOMENS) TABS Take 1 tablet by mouth every morning.    . polyethylene glycol (MIRALAX / GLYCOLAX) packet Take 17 g by mouth daily.    Marland Kitchen dexamethasone (DECADRON) 4 MG tablet Take 1 tablet (4 mg total) by mouth 2 (two) times  daily with a meal. 60 tablet 0  . HYDROmorphone (DILAUDID) 2 MG tablet Take 1 tablet (2 mg total) by mouth every 4 (four) hours as needed for severe pain. 30 tablet 0  . LORazepam (ATIVAN) 0.5 MG tablet Take 1 tablet (0.5 mg total) by mouth 2 (two) times daily as needed for anxiety. (Patient not taking: Reported on 02/19/2017) 20 tablet 0  . ondansetron (ZOFRAN) 8 MG tablet Take 1 tablet (8 mg total) by mouth every 8 (eight) hours as needed for nausea. 30 tablet 3  . prochlorperazine (COMPAZINE) 10 MG tablet Take 1 tablet (10 mg total) by mouth every 6 (six) hours as needed. 30 tablet 3   No current facility-administered medications for this visit.     PHYSICAL EXAMINATION: ECOG PERFORMANCE STATUS: 1 - Symptomatic but completely ambulatory  Vitals:   02/20/17 1200  BP: 129/78  Pulse: (!) 110  Resp: 20  Temp: 98.3 F (36.8 C)   Filed Weights   02/20/17 1200  Weight: 132 lb 6.4 oz (60.1 kg)    GENERAL:alert, no distress and comfortable.  She looks thin SKIN: skin color, texture, turgor are normal, no rashes or significant lesions.  Note of minor bruising on the supraclavicular region EYES: normal, Conjunctiva are pink and non-injected, sclera clear Musculoskeletal:no cyanosis of digits and no clubbing  NEURO: alert & oriented x 3 with fluent speech, no focal motor/sensory deficits  LABORATORY DATA:  I have reviewed the data as listed  Component Value Date/Time   NA 136 02/17/2017 1130   NA 136 02/05/2017 1323   K 3.6 02/17/2017 1130   K 3.9 02/05/2017 1323   CL 102 02/17/2017 1130   CO2 23 02/17/2017 1130   CO2 24 02/05/2017 1323   GLUCOSE 113 (H) 02/17/2017 1130   GLUCOSE 99 02/05/2017 1323   BUN 10 02/17/2017 1130   BUN 7.8 02/05/2017 1323   CREATININE 0.60 02/17/2017 1130   CREATININE 0.7 02/05/2017 1323   CALCIUM 9.0 02/17/2017 1130   CALCIUM 9.4 02/05/2017 1323   PROT 7.3 02/17/2017 1130   PROT 7.0 02/05/2017 1323   ALBUMIN 2.9 (L) 02/17/2017 1130   ALBUMIN  2.7 (L) 02/05/2017 1323   AST 21 02/17/2017 1130   AST 20 02/05/2017 1323   ALT 12 (L) 02/17/2017 1130   ALT 12 02/05/2017 1323   ALKPHOS 93 02/17/2017 1130   ALKPHOS 101 02/05/2017 1323   BILITOT 0.2 (L) 02/17/2017 1130   BILITOT 0.25 02/05/2017 1323   GFRNONAA >60 02/17/2017 1130   GFRAA >60 02/17/2017 1130    No results found for: SPEP, UPEP  Lab Results  Component Value Date   WBC 11.0 (H) 02/17/2017   NEUTROABS 6.9 (H) 02/05/2017   HGB 9.9 (L) 02/17/2017   HCT 30.9 (L) 02/17/2017   MCV 81.5 02/17/2017   PLT 466 (H) 02/17/2017      Chemistry      Component Value Date/Time   NA 136 02/17/2017 1130   NA 136 02/05/2017 1323   K 3.6 02/17/2017 1130   K 3.9 02/05/2017 1323   CL 102 02/17/2017 1130   CO2 23 02/17/2017 1130   CO2 24 02/05/2017 1323   BUN 10 02/17/2017 1130   BUN 7.8 02/05/2017 1323   CREATININE 0.60 02/17/2017 1130   CREATININE 0.7 02/05/2017 1323      Component Value Date/Time   CALCIUM 9.0 02/17/2017 1130   CALCIUM 9.4 02/05/2017 1323   ALKPHOS 93 02/17/2017 1130   ALKPHOS 101 02/05/2017 1323   AST 21 02/17/2017 1130   AST 20 02/05/2017 1323   ALT 12 (L) 02/17/2017 1130   ALT 12 02/05/2017 1323   BILITOT 0.2 (L) 02/17/2017 1130   BILITOT 0.25 02/05/2017 1323       RADIOGRAPHIC STUDIES: I reviewed a few imaging studies with the patient I have personally reviewed the radiological images as listed and agreed with the findings in the report. Dg Chest 2 View  Result Date: 01/27/2017 CLINICAL DATA:  Cough and congestion for 1 week. Left-side chest pain since yesterday. EXAM: CHEST  2 VIEW COMPARISON:  PET CT scan 09/03/2016 in 07/2019 02/2016. FINDINGS: Two nodular opacities are seen on the PA view only in the left mid lung zone measuring 0.8 and 0.6 cm in diameter. The right lung appears clear. No consolidative process, pneumothorax or effusion. Heart size is normal. Aortic atherosclerosis is noted. IMPRESSION: Negative for pneumonia. Two nodular  opacities in the left mid lung are seen on the PA view only. Recommend attention on follow-up examinations. No corresponding nodules are seen on the prior CT scans. Electronically Signed   By: Inge Rise M.D.   On: 01/27/2017 10:06   Ct Angio Chest Pe W And/or Wo Contrast  Result Date: 01/27/2017 CLINICAL DATA:  Productive cough, shortness of breath. EXAM: CT ANGIOGRAPHY CHEST WITH CONTRAST TECHNIQUE: Multidetector CT imaging of the chest was performed using the standard protocol during bolus administration of intravenous contrast. Multiplanar CT image reconstructions and MIPs were  obtained to evaluate the vascular anatomy. CONTRAST:  72 mL of Isovue 370 intravenously. COMPARISON:  CT scan of August 14, 2016. FINDINGS: Cardiovascular: Atherosclerosis of thoracic aorta is noted without aneurysm or dissection. There is no definite evidence of pulmonary embolus. Mediastinum/Nodes: Overall, significantly increased mediastinal adenopathy is noted concerning for metastatic disease. Right hilar lymph node measuring 24 mm is noted. Prevascular lymph node measuring 23 mm is noted aortopulmonary window lymph node measuring 15 mm is noted. 12 mm left hilar lymph node is noted. Subcarinal adenopathy measuring 46 x 20 mm is noted. Pretracheal lymph node measuring 18 mm is noted. Left axillary adenopathy is noted with largest lymph node measuring 12 mm. 15 mm left supraclavicular adenopathy is noted. 14 mm right supraclavicular adenopathy is noted. Lungs/Pleura: No pneumothorax or pleural effusion is noted. Multiple pulmonary nodules of varying sizes are noted consistent with metastatic disease. The largest on the right measures 2.6 x 2.0 cm in the lung base. The largest on the left measures 14 x 9 mm in the upper lobe. Upper Abdomen: No acute abnormality. Musculoskeletal: No chest wall abnormality. No acute or significant osseous findings. Review of the MIP images confirms the above findings. IMPRESSION: No evidence of  pulmonary embolus. Aortic atherosclerosis. Extensive mediastinal adenopathy as well as bilateral pulmonary nodules are noted consistent with metastatic disease. Left axillary and bilateral supraclavicular adenopathy is noted as well. Electronically Signed   By: Marijo Conception, M.D.   On: 01/27/2017 13:49   Mr Jeri Cos GG Contrast  Result Date: 02/14/2017 CLINICAL DATA:  Lung cancer.  Dizziness. EXAM: MRI HEAD WITHOUT AND WITH CONTRAST TECHNIQUE: Multiplanar, multiecho pulse sequences of the brain and surrounding structures were obtained without and with intravenous contrast. CONTRAST:  34m MULTIHANCE GADOBENATE DIMEGLUMINE 529 MG/ML IV SOLN COMPARISON:  CT head without and with contrast 08/14/2016 FINDINGS: Brain: The enhancing lesion within the anterior right middle frontal gyrus measures 7.5 x 6.5 x 8.0 mm on image 19 of series 12. A left occipital metastasis measures 8.5 x 7.5 x 8.0 mm on image 25 of series 11. No other focal parenchymal enhancement is present. There is mild T2 changes associated with these lesions. No acute infarcts or hemorrhage is present. Ventricles are of normal size. Minimal periventricular white matter changes are otherwise within normal limits for age. No significant extra-axial fluid collection is present. Vascular: Flow is present in the major intracranial arteries. Skull and upper cervical spine: The skullbase is normal. The craniocervical junction is normal. The upper cervical spine is unremarkable. Midline sagittal structures are otherwise within normal limits. Sinuses/Orbits: Mucosal thickening and polyps are present in the maxillary sinuses, right greater than left. The remaining paranasal sinuses and right mastoid air cells are clear. IMPRESSION: 1. Two 8 mm enhancing lesions compatible with metastases in the anterior right frontal lobe and left occipital lobe. 2. The mild sinus disease. Electronically Signed   By: CSan MorelleM.D.   On: 02/14/2017 17:19   Nm Pet  Image Restag (ps) Skull Base To Thigh  Result Date: 02/14/2017 CLINICAL DATA:  Subsequent treatment strategy for cervical cancer. EXAM: NUCLEAR MEDICINE PET SKULL BASE TO THIGH TECHNIQUE: 7.7 MCi F-18 FDG was injected intravenously. Full-ring PET imaging was performed from the skull base to thigh after the radiotracer. CT data was obtained and used for attenuation correction and anatomic localization. FASTING BLOOD GLUCOSE:  Value: 108 mg/dl COMPARISON:  09/03/2016 FINDINGS: NECK Interval development of hypermetabolic lymph nodes in the supraclavicular region bilaterally. New 11 mm left  supraclavicular lymph node (image 39 series 4) demonstrates SUV max = 5.9. CHEST Interval progression of hypermetabolic left axillary lymphadenopathy. Mediastinal and hilar lymphadenopathy has progressed in the interval with new lymph nodes evident in previous lymph nodes enlarging in the interval. 13 mm short axis high right paratracheal lymph node (image 46 series 4) was 5 mm short axis previously and today demonstrates SUV max = 10.5. A new hypermetabolic right infrahilar lymph node demonstrates SUV max = 9.3. Interval development of multiple bilateral pulmonary nodules, too numerous to count. 1.5 cm anterior left upper lobe pulmonary nodule (image 20 series 8) is new since prior study and demonstrates SUV max = 11.4. 3.2 cm hypermetabolic nodule in the right costophrenic sulcus is new in the interval. There is confluent hypermetabolic airspace disease in the anterior left lower lobe. Small left pleural effusion is new in the interval. Low level uptake again identified in the right breast lesion containing localization clip. ABDOMEN/PELVIS No abnormal hypermetabolic activity within the liver, pancreas, or spleen. A new 1.8 cm right adrenal nodule is hypermetabolic with SUV max = 9.9. A new 7 mm short axis left para-aortic lymph node (image 123 series 4) is hypermetabolic with SUV max = 5.3. Hypermetabolic small aortocaval lymph  node is identified at the same level. Several hypermetabolic foci are seen scattered along the small bowel/mesentery, but choroid live lesions cannot be identified on the noncontrast CT images. Cholelithiasis again noted. Abdominal aortic atherosclerosis evident without aneurysm. Small volume intraperitoneal free fluid. SKELETON No focal hypermetabolic activity to suggest skeletal metastasis. IMPRESSION: 1. Interval development of new and interval progression of the existing mediastinal and hilar lymphadenopathy. 2. Interval development of hypermetabolic supraclavicular nodal metastases bilaterally and progression of hypermetabolic left axillary lymphadenopathy. 3. Interval development of numerous bilateral pulmonary nodules of varying sizes, many of which are markedly hypermetabolic. Imaging features are consistent with progression of pulmonary metastases. An area of confluent airspace consolidation in the anterior left lower lobe is hypermetabolic and may represent tumor or combination of tumor and infection/inflammation. 4. New small left pleural effusion. 5. Interval development of hypermetabolic right adrenal metastasis. 6. Interval development of small hypermetabolic retroperitoneal lymph nodes in the abdomen, suspicious for metastatic disease. Electronically Signed   By: Misty Stanley M.D.   On: 02/14/2017 16:29   US Biopsy  Result Date: 02/17/2017 INDICATION: History of cervical cancer. Patient has multiple pulmonary nodules and lymphadenopathy. Plan for ultrasound-guided biopsy of a left supraclavicular lymph node for tissue diagnosis. EXAM: ULTRASOUND-GUIDED CORE BIOPSY OF LEFT SUPRACLAVICULAR LYMPH NODE MEDICATIONS: None. ANESTHESIA/SEDATION: Moderate (conscious) sedation was employed during this procedure. A total of Versed 2.0 mg and Fentanyl 50 mcg was administered intravenously. Moderate Sedation Time: 15 minutes. The patient's level of consciousness and vital signs were monitored continuously by  radiology nursing throughout the procedure under my direct supervision. FLUOROSCOPY TIME:  None COMPLICATIONS: None immediate. PROCEDURE: Informed written consent was obtained from the patient after a thorough discussion of the procedural risks, benefits and alternatives. All questions were addressed. A timeout was performed prior to the initiation of the procedure. Left side of the neck was evaluated with ultrasound. Enlarged left supraclavicular lymph node was targeted for biopsy. The left side of the neck was prepped with chlorhexidine and sterile field was created. Skin was anesthetized with 1% lidocaine. Using ultrasound guidance, core biopsies were obtained of a left supraclavicular lymph node with an 18 gauge core device. Four samples placed in formalin and two samples placed in saline. Bandage placed over the puncture  site. FINDINGS: Enlarged hypoechoic lymph nodes in left supraclavicular area. These lymph nodes are rounded and appear abnormal. Core biopsy needle identified within the supraclavicular lymph node on all occasions. IMPRESSION: Ultrasound-guided core biopsies of the left supraclavicular lymph node. Electronically Signed   By: Markus Daft M.D.   On: 02/17/2017 15:04    ASSESSMENT & PLAN:  Malignant neoplasm of cervix ([place]) Unfortunately, recent biopsy confirmed diagnosis of recurrent, metastatic cervical cancer to multiple areas including the lung, the brain and lymph nodes. We reviewed current guidelines. She understood the rational of radiation therapy to the brain first before systemic treatment. We discussed the role of chemotherapy. The intent is of palliative intent. Due to her profound recent weight loss, protein calorie malnutrition and other problems, I felt that combination chemotherapy might be too toxic. She had prior exposure to cisplatin, complicated by severe nausea and vomiting.  At present time, she already had problem with nausea. She is noted to be allergic to Taxol.   Unfortunately, the eliminated a big potential effective treatment in her situation. Ultimately, based on current guidelines, I think it is reasonable to try carboplatinum every 3 weeks We discussed some of the risks, benefits, side-effects of carboplatin  Some of the short term side-effects included, though not limited to, including weight loss, life threatening infections, risk of allergic reactions, need for transfusions of blood products, nausea, vomiting, change in bowel habits, loss of hair, admission to hospital for various reasons, and risks of death.   The patient is aware that the response rates discussed earlier is not guaranteed.  After a long discussion, patient made an informed decision to proceed with the prescribed plan of care.   Patient education material was dispensed. The option of port-a-cath placement was review with the patient, including discussion on the advantages and disadvantages of having a port-a-cath placed. After discussion, the patient was not agreeable to port placement preferred to proceed with peripheral veins for venous access during therapy.  She will complete SRS treatment next week.  I plan to start her on chemotherapy in 2 weeks time.      Brain metastasis ([place]) We reviewed imaging study. She has been referred to radiation oncologist with plan for [facility] treatment next week. In the meantime, she had nonspecific symptoms of nausea The MRI showed no significant vasogenic edema.  However, due to her nausea symptoms, I plan to start her on low-dose dexamethasone twice a day. I plan to reevaluate again in 2 weeks  Continuous tobacco abuse I spent some time counseling the patient the importance of tobacco cessation. We discussed common strategies including nicotine patches, Tobacco Quit-line, and other nicotine replacement products to assist in hereffort to quit  she appears motivated to quit.   Nausea without vomiting The cause is unknown but could be due  to heavy burden of disease. I plan to start her on dexamethasone along with anti-emetics to take as needed.  Other constipation She had mild recent constipation. We discussed chronic laxative therapy  Anemia in neoplastic disease She has recent anemia and adequate workup. The pattern is not suggestive of iron deficiency anemia, likely anemia related to her malignancy. She is not very symptomatic. I recommend observation only for now  Cancer associated pain She has cancer pain in her upper back region, likely due to involvement of the pleural area. I recommend low-dose Dilaudid to take as needed. I did warn her about risk of sedation and constipation. We discussed narcotic refill policy  Protein-calorie malnutrition, moderate (  [place]) This is likely related to her untreated cancer. Hopefully, the dexamethasone will help stimulate appetite. If this is ineffective, I plan to add additional medication and consult dietitian in the next visit  Physical debility She has significant physical disability right now and debility from her cancer. I recommend she takes time off indefinitely while pursuing palliative chemotherapy.  Goals of care, counseling/discussion I had extensive discussion with the patient in the presence of her daughter. The patient is aware she has incurable disease and treatment is strictly palliative. We discussed importance of Advanced Directives and Living will. I will get assistance from our social worker to help her fill out some paperwork. We discussed CODE STATUS; the patient desires DNR She is appointing her son, Dayton Scrape, as her healthcare medical power of attorney We discussed prognosis. Due to significant burden of disease, without systemic treatment, her longevity would be measured in less than 3 months. With chemotherapy, she likely will have more than 6 months to a year, possibly longer if she had excellent response to treatment    No orders of the  defined types were placed in this encounter.  All questions were answered. The patient knows to call the clinic with any problems, questions or concerns. No barriers to learning was detected. I spent 60 minutes counseling the patient face to face. The total time spent in the appointment was 80 minutes and more than 50% was on counseling and review of test results     Heath Lark, MD 02/21/2017 7:01 AM

## 2017-02-21 NOTE — Assessment & Plan Note (Signed)
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 We reviewed current guidelines. She understood the rational of radiation therapy to the brain first before systemic treatment. We discussed the role of chemotherapy. The intent is of palliative intent. Due to her profound recent weight loss, protein calorie malnutrition and other problems, I felt that combination chemotherapy might be too toxic. She had prior exposure to cisplatin, complicated by severe nausea and vomiting.  At present time, she already had problem with nausea. She is noted to be allergic to Taxol.  Unfortunately, the eliminated a big potential effective treatment in her situation. Ultimately, based on current guidelines, I think it is reasonable to try carboplatinum every 3 weeks We discussed some of the risks, benefits, side-effects of carboplatin  Some of the short term side-effects included, though not limited to, including weight loss, life threatening infections, risk of allergic reactions, need for transfusions of blood products, nausea, vomiting, change in bowel habits, loss of hair, admission to hospital for various reasons, and risks of death.   The patient is aware that the response rates discussed earlier is not guaranteed.  After a long discussion, patient made an informed decision to proceed with the prescribed plan of care.   Patient education material was dispensed. The option of port-a-cath placement was review with the patient, including discussion on the advantages and disadvantages of having a port-a-cath placed. After discussion, the patient was not agreeable to port placement preferred to proceed with peripheral veins for venous access during therapy.  She will complete SRS treatment next week.  I plan to start her on chemotherapy in 2 weeks time.

## 2017-02-21 NOTE — Assessment & Plan Note (Signed)
She has significant physical disability right now and debility from her cancer. I recommend she takes time off indefinitely while pursuing palliative chemotherapy.

## 2017-02-21 NOTE — Assessment & Plan Note (Signed)
I spent some time counseling the patient the importance of tobacco cessation. We discussed common strategies including nicotine patches, Tobacco Quit-line, and other nicotine replacement products to assist in hereffort to quit  she appears motivated to quit.

## 2017-02-21 NOTE — Assessment & Plan Note (Signed)
This is likely related to her untreated cancer. Hopefully, the dexamethasone will help stimulate appetite. If this is ineffective, I plan to add additional medication and consult dietitian in the next visit

## 2017-02-21 NOTE — Assessment & Plan Note (Signed)
She has recent anemia and adequate workup. The pattern is not suggestive of iron deficiency anemia, likely anemia related to her malignancy. She is not very symptomatic. I recommend observation only for now

## 2017-02-21 NOTE — Assessment & Plan Note (Signed)
I had extensive discussion with the patient in the presence of her daughter. The patient is aware she has incurable disease and treatment is strictly palliative. We discussed importance of Advanced Directives and Living will. I will get assistance from our social worker to help her fill out some paperwork. We discussed CODE STATUS; the patient desires DNR She is appointing her son, Dayton Scrape, as her healthcare medical power of attorney We discussed prognosis. Due to significant burden of disease, without systemic treatment, her longevity would be measured in less than 3 months. With chemotherapy, she likely will have more than 6 months to a year, possibly longer if she had excellent response to treatment

## 2017-02-21 NOTE — Assessment & Plan Note (Signed)
We reviewed imaging study. She has been referred to radiation oncologist with plan for [facility] treatment next week. In the meantime, she had nonspecific symptoms of nausea The MRI showed no significant vasogenic edema.  However, due to her nausea symptoms, I plan to start her on low-dose dexamethasone twice a day. I plan to reevaluate again in 2 weeks

## 2017-02-24 DIAGNOSIS — Z510 Encounter for antineoplastic radiation therapy: Secondary | ICD-10-CM | POA: Diagnosis not present

## 2017-02-26 ENCOUNTER — Other Ambulatory Visit: Payer: Self-pay | Admitting: *Deleted

## 2017-02-26 DIAGNOSIS — C539 Malignant neoplasm of cervix uteri, unspecified: Secondary | ICD-10-CM

## 2017-02-28 ENCOUNTER — Encounter: Payer: Self-pay | Admitting: Radiation Oncology

## 2017-02-28 ENCOUNTER — Ambulatory Visit
Admission: RE | Admit: 2017-02-28 | Discharge: 2017-02-28 | Disposition: A | Discharge status: Home / Self Care | Payer: BLUE CROSS/BLUE SHIELD | Source: Ambulatory Visit | Attending: Radiation Oncology | Admitting: Radiation Oncology

## 2017-02-28 VITALS — BP 153/91 | HR 95 | Temp 98.4°F | Resp 18

## 2017-02-28 DIAGNOSIS — Z510 Encounter for antineoplastic radiation therapy: Secondary | ICD-10-CM | POA: Diagnosis not present

## 2017-02-28 DIAGNOSIS — C7931 Secondary malignant neoplasm of brain: Secondary | ICD-10-CM

## 2017-02-28 NOTE — Op Note (Signed)
  Name: Valerie Figueroa  MRN: [id number]  Date: 02/28/2017   DOB: [birth year]  Stereotactic Radiosurgery Operative Note  PRE-OPERATIVE DIAGNOSIS:  Multiple Brain Metastases  POST-OPERATIVE DIAGNOSIS:  Multiple Brain Metastases  PROCEDURE:  Stereotactic Radiosurgery  SURGEON:  Peggyann Shoals, MD  NARRATIVE: The patient underwent a radiation treatment planning session in the radiation oncology simulation suite under the care of the radiation oncology physician and physicist.  I participated closely in the radiation treatment planning afterwards. The patient underwent planning CT which was fused to 3T high resolution MRI with 1 mm axial slices.  These images were fused on the planning system.  We contoured the gross target volumes and subsequently expanded this to yield the Planning Target Volume. I actively participated in the planning process.  I helped to define and review the target contours and also the contours of the optic pathway, eyes, brainstem and selected nearby organs at risk.  All the dose constraints for critical structures were reviewed and compared to AAPM Task Group 101.  The prescription dose conformity was reviewed.  I approved the plan electronically.    Accordingly, [name] was brought to the TrueBeam stereotactic radiation treatment linac and placed in the custom immobilization mask.  The patient was aligned according to the IR fiducial markers with BrainLab Exactrac, then orthogonal x-rays were used in ExacTrac with the 6DOF robotic table and the shifts were made to align the patient  [name] received stereotactic radiosurgery uneventfully.    Lesions treated:  2   Complex lesions treated:  0 (>3.5 cm, <58m of optic path, or within the brainstem)   The detailed description of the procedure is recorded in the radiation oncology procedure note.  I was present for the duration of the procedure.  DISPOSITION:  Following delivery, the  patient was transported to nursing in stable condition and monitored for possible acute effects to be discharged to home in stable condition with follow-up in one month.  SPeggyann Shoals MD 02/28/2017 3:22 PM

## 2017-02-28 NOTE — Progress Notes (Signed)
  Radiation Oncology         [phone] ________________________________  Stereotactic Treatment Procedure Note  Name: Valerie Figueroa MRN: [id number]  Date: 02/28/2017  DOB: [birth year]  SPECIAL TREATMENT PROCEDURE    ICD-9-CM ICD-10-CM   1. Brain metastasis ([place]) 198.3 C79.31     3D TREATMENT PLANNING AND DOSIMETRY:  The patient's radiation plan was reviewed and approved by neurosurgery and radiation oncology prior to treatment.  It showed 3-dimensional radiation distributions overlaid onto the planning CT/MRI image set.  The [facility] for the target structures as well as the organs at risk were reviewed. The documentation of the 3D plan and dosimetry are filed in the radiation oncology EMR.  NARRATIVE:  Valerie Figueroa was brought to the TrueBeam stereotactic radiation treatment machine and placed supine on the CT couch. The head frame was applied, and the patient was set up for stereotactic radiosurgery.  Neurosurgery was present for the set-up and delivery  SIMULATION VERIFICATION:  In the couch zero-angle position, the patient underwent Exactrac imaging using the Brainlab system with orthogonal KV images.  These were carefully aligned and repeated to confirm treatment position for each of the isocenters.  The Exactrac snap film verification was repeated at each couch angle.  PROCEDURE: Valerie Figueroa received stereotactic radiosurgery to the following targets:  Left occipital 8 mm target was treated using 2 Dynamic Conformal Arcs to a prescription dose of 20 Gy.  ExacTrac registration was performed for each couch angle.  The 80% isodose line was prescribed.  6 MV X-rays were delivered in the flattening filter free beam mode.  Right Frontal 8 mm target was treated using 2 Dynamic Conformal Arcs to a prescription dose of 20 Gy.  ExacTrac registration was performed for each couch angle.  The 82% isodose line was prescribed.  6 MV X-rays were delivered in the flattening  filter free beam mode.  STEREOTACTIC TREATMENT MANAGEMENT:  Following delivery, the patient was transported to nursing in stable condition and monitored for possible acute effects.  Vital signs were recorded BP (!) 153/91 (Patient Position: Sitting, Cuff Size: Normal)   Pulse 95   Temp 98.4 F (36.9 C) (Oral)   Resp 18   LMP 08/25/2015   SpO2 98% . The patient tolerated treatment without significant acute effects, and was discharged to home in stable condition.    PLAN: Follow-up in one month.  2 week steroid taper given ________________________________  Sheral Apley. Tammi Klippel, M.D.

## 2017-02-28 NOTE — Progress Notes (Signed)
Vitals stable. Denies pain. Denies headache, dizziness, vomiting, diplopia or ringing in the ears. Patient subdued because of the Ativan she took prior to [facility] treatment. Patient alert and oriented x 3. Reports nausea is less today than the past week. Discuss taking Zofran and Compazine around the clock to manage nausea. Instructed patient to avoid strenuous activity for the next 24 hours. Also, instructed patient to call [phone] with needs. Mont Dutton, RT provided patient with her one month follow up appointment card. Instructed patient of the following decadron taper. Patient verbalized understanding.  Starting Saturday, March 10th. Take 2 mg by mouth twice per day for 7 days         Then, Take 2 mg by mouth once per day for 7 days                     And    STOP.  BP (!) 153/91 (Patient Position: Sitting, Cuff Size: Normal)   Pulse 95   Temp 98.4 F (36.9 C) (Oral)   Resp 18   LMP 08/25/2015   SpO2 98%  Wt Readings from Last 3 Encounters:  02/20/17 132 lb 6.4 oz (60.1 kg)  02/19/17 132 lb 12.8 oz (60.2 kg)  02/05/17 138 lb 11.2 oz (62.9 kg)

## 2017-03-03 NOTE — Progress Notes (Signed)
  Radiation Oncology         [phone]) [phone] ________________________________  Name: Valerie Figueroa MRN: [id number]  Date: 02/28/2017  DOB: 02/22/1956  End of Treatment Note  Diagnosis:   61 yo woman squamous cell carcinoma of cervix and two 8 mm brain metastases in the right frontal lobe and left occipital lobe.      Indication for treatment:  Palliation       Radiation treatment dates:   02/28/17  Site/dose/beams/energy:    [name] received stereotactic radiosurgery to the following targets:  Left occipital 8 mm target was treated using 2 Dynamic Conformal Arcs to a prescription dose of 20 Gy.  ExacTrac registration was performed for each couch angle.  The 80% isodose line was prescribed.  6 MV X-rays were delivered in the flattening filter free beam mode.  Right Frontal 8 mm target was treated using 2 Dynamic Conformal Arcs to a prescription dose of 20 Gy.  ExacTrac registration was performed for each couch angle.  The 82% isodose line was prescribed.  6 MV X-rays were delivered in the flattening filter free beam mode.  Narrative: The patient tolerated radiation treatment relatively well.     Plan: The patient has completed radiation treatment. The patient will return to radiation oncology clinic for routine followup in one month. I advised her to call or return sooner if she has any questions or concerns related to her recovery or treatment. ________________________________  Sheral Apley. Tammi Klippel, M.D.

## 2017-03-05 ENCOUNTER — Other Ambulatory Visit: Payer: Self-pay | Admitting: Hematology and Oncology

## 2017-03-06 ENCOUNTER — Ambulatory Visit (HOSPITAL_BASED_OUTPATIENT_CLINIC_OR_DEPARTMENT_OTHER): Payer: BLUE CROSS/BLUE SHIELD

## 2017-03-06 ENCOUNTER — Ambulatory Visit: Payer: BLUE CROSS/BLUE SHIELD | Admitting: Nutrition

## 2017-03-06 ENCOUNTER — Telehealth: Payer: Self-pay | Admitting: Hematology and Oncology

## 2017-03-06 ENCOUNTER — Encounter: Payer: Self-pay | Admitting: Hematology and Oncology

## 2017-03-06 ENCOUNTER — Other Ambulatory Visit (HOSPITAL_COMMUNITY)
Admission: RE | Admit: 2017-03-06 | Discharge: 2017-03-06 | Disposition: A | Discharge status: Home / Self Care | Payer: BLUE CROSS/BLUE SHIELD | Source: Ambulatory Visit | Attending: Hematology and Oncology | Admitting: Hematology and Oncology

## 2017-03-06 ENCOUNTER — Ambulatory Visit (HOSPITAL_BASED_OUTPATIENT_CLINIC_OR_DEPARTMENT_OTHER): Payer: BLUE CROSS/BLUE SHIELD | Admitting: Hematology and Oncology

## 2017-03-06 ENCOUNTER — Other Ambulatory Visit (HOSPITAL_BASED_OUTPATIENT_CLINIC_OR_DEPARTMENT_OTHER): Payer: BLUE CROSS/BLUE SHIELD

## 2017-03-06 DIAGNOSIS — C7931 Secondary malignant neoplasm of brain: Secondary | ICD-10-CM

## 2017-03-06 DIAGNOSIS — C539 Malignant neoplasm of cervix uteri, unspecified: Secondary | ICD-10-CM

## 2017-03-06 DIAGNOSIS — C3412 Malignant neoplasm of upper lobe, left bronchus or lung: Secondary | ICD-10-CM | POA: Insufficient documentation

## 2017-03-06 DIAGNOSIS — Z5111 Encounter for antineoplastic chemotherapy: Secondary | ICD-10-CM

## 2017-03-06 DIAGNOSIS — E440 Moderate protein-calorie malnutrition: Secondary | ICD-10-CM | POA: Diagnosis not present

## 2017-03-06 DIAGNOSIS — D630 Anemia in neoplastic disease: Secondary | ICD-10-CM | POA: Diagnosis not present

## 2017-03-06 DIAGNOSIS — Z720 Tobacco use: Secondary | ICD-10-CM

## 2017-03-06 LAB — COMPREHENSIVE METABOLIC PANEL
ALBUMIN: 2.7 g/dL — AB (ref 3.5–5.0)
ALK PHOS: 121 U/L (ref 40–150)
ALT: 57 U/L — ABNORMAL HIGH (ref 0–55)
AST: 25 U/L (ref 5–34)
Anion Gap: 11 mEq/L (ref 3–11)
BILIRUBIN TOTAL: 0.22 mg/dL (ref 0.20–1.20)
BUN: 21 mg/dL (ref 7.0–26.0)
CALCIUM: 9.2 mg/dL (ref 8.4–10.4)
CO2: 23 mEq/L (ref 22–29)
Chloride: 100 mEq/L (ref 98–109)
Creatinine: 0.7 mg/dL (ref 0.6–1.1)
EGFR: 87 mL/min/{1.73_m2} — ABNORMAL LOW (ref >90)
Glucose: 104 mg/dl (ref 70–140)
POTASSIUM: 3.9 meq/L (ref 3.5–5.1)
SODIUM: 134 meq/L — AB (ref 136–145)
Total Protein: 6.8 g/dL (ref 6.4–8.3)

## 2017-03-06 LAB — CBC WITH DIFFERENTIAL/PLATELET
BASO%: 0.1 % (ref 0.0–2.0)
BASOS ABS: 0 10*3/uL (ref 0.0–0.1)
EOS ABS: 0.1 10*3/uL (ref 0.0–0.5)
EOS%: 0.4 % (ref 0.0–7.0)
HEMATOCRIT: 33.2 % — AB (ref 34.8–46.6)
HEMOGLOBIN: 10.7 g/dL — AB (ref 11.6–15.9)
LYMPH#: 1.8 10*3/uL (ref 0.9–3.3)
LYMPH%: 7.6 % — ABNORMAL LOW (ref 14.0–49.7)
MCH: 26.3 pg (ref 25.1–34.0)
MCHC: 32.3 g/dL (ref 31.5–36.0)
MCV: 81.5 fL (ref 79.5–101.0)
MONO#: 1.6 10*3/uL — AB (ref 0.1–0.9)
MONO%: 6.8 % (ref 0.0–14.0)
NEUT#: 20.2 10*3/uL — ABNORMAL HIGH (ref 1.5–6.5)
NEUT%: 85.1 % — ABNORMAL HIGH (ref 38.4–76.8)
PLATELETS: 335 10*3/uL (ref 145–400)
RBC: 4.08 10*6/uL (ref 3.70–5.45)
RDW: 18.8 % — AB (ref 11.2–14.5)
WBC: 23.8 10*3/uL — AB (ref 3.9–10.3)

## 2017-03-06 LAB — TECHNOLOGIST REVIEW

## 2017-03-06 MED ORDER — DEXAMETHASONE SODIUM PHOSPHATE 10 MG/ML IJ SOLN
10.0000 mg | Freq: Once | INTRAMUSCULAR | Status: AC
  Administered 2017-03-06: 10 mg via INTRAVENOUS

## 2017-03-06 MED ORDER — SODIUM CHLORIDE 0.9 % IV SOLN
Freq: Once | INTRAVENOUS | Status: AC
  Administered 2017-03-06: 11:00:00 via INTRAVENOUS

## 2017-03-06 MED ORDER — DEXAMETHASONE SODIUM PHOSPHATE 10 MG/ML IJ SOLN
INTRAMUSCULAR | Status: AC
  Filled 2017-03-06: qty 1

## 2017-03-06 MED ORDER — DEXAMETHASONE SODIUM PHOSPHATE 100 MG/10ML IJ SOLN: 10.0000 mg | Freq: Once | INTRAMUSCULAR | Status: DC

## 2017-03-06 MED ORDER — PALONOSETRON HCL INJECTION 0.25 MG/5ML
0.2500 mg | Freq: Once | INTRAVENOUS | Status: AC
  Administered 2017-03-06: 0.25 mg via INTRAVENOUS

## 2017-03-06 MED ORDER — PALONOSETRON HCL INJECTION 0.25 MG/5ML
INTRAVENOUS | Status: AC
  Filled 2017-03-06: qty 5

## 2017-03-06 MED ORDER — SODIUM CHLORIDE 0.9 % IV SOLN
471.0000 mg | Freq: Once | INTRAVENOUS | Status: AC
  Administered 2017-03-06: 470 mg via INTRAVENOUS
  Filled 2017-03-06: qty 47

## 2017-03-06 NOTE — Assessment & Plan Note (Signed)
I spent some time counseling the patient the importance of tobacco cessation. We discussed common strategies including nicotine patches, Tobacco Quit-line, and other nicotine replacement products to assist in hereffort to quit  she appears motivated to quit.

## 2017-03-06 NOTE — Patient Instructions (Signed)
[facility] Discharge Instructions for Patients Receiving Chemotherapy  Today you received the following chemotherapy agent: Carboplatin.  To help prevent nausea and vomiting after your treatment, we encourage you to take your nausea medication as directed.    If you develop nausea and vomiting that is not controlled by your nausea medication, call the clinic.   BELOW ARE SYMPTOMS THAT SHOULD BE REPORTED IMMEDIATELY:  *FEVER GREATER THAN 100.5 F  *CHILLS WITH OR WITHOUT FEVER  NAUSEA AND VOMITING THAT IS NOT CONTROLLED WITH YOUR NAUSEA MEDICATION  *UNUSUAL SHORTNESS OF BREATH  *UNUSUAL BRUISING OR BLEEDING  TENDERNESS IN MOUTH AND THROAT WITH OR WITHOUT PRESENCE OF ULCERS  *URINARY PROBLEMS  *BOWEL PROBLEMS  UNUSUAL RASH Items with * indicate a potential emergency and should be followed up as soon as possible.  Feel free to call the clinic you have any questions or concerns. The clinic phone number is [phone].  Please show the [place] at check-in to the Emergency Department and triage nurse.

## 2017-03-06 NOTE — Progress Notes (Signed)
[place] OFFICE PROGRESS NOTE  Patient Care Team: Kelton Pillar, MD as PCP - General (Family Medicine)  SUMMARY OF ONCOLOGIC HISTORY:   Cervix cancer [facility]) (Resolved)   08/16/2015 Initial Diagnosis    Cervix cancer       Malignant neoplasm of cervix ([place])   08/08/2015 Initial Diagnosis    Patient has had no regular medical care in years, including no PAP in >15 years. She had no bleeding since menopause until several hours of heavy vaginal bleeding in 04-2015, which resolved other than intermittent spotting until extremely heavy vaginal bleeding again 08-08-15.      08/16/2015 Pathology Results    Cervix, biopsy - SQUAMOUS CELL CARCINOMA. Microscopic Comment The specimens are involved by invasive squamous cell carcinoma, moderately to poorly differentiated with focal basaloid features.      08/16/2015 Miscellaneous    She was seen in [facility] ED then, with Hgb 8.0; she was given premarin and continued on oral megace x 2 weeks. She was seen by Dr Mallory Shirk on 08-16-15 , with finding of large fungating cervical mass with parametrial involvement      08/21/2015 Imaging    Large irregular mass involving the cervix and lower uterine segment compatible with recently biopsied cervical malignancy. There is associated dilatation of the endometrial canal. Multiple enlarged retroperitoneal and pelvic lymph nodes concerning for metastatic adenopathy. Abnormal enhancement of the urothelium involving the right renal collecting system and proximal right ureter raising the possibility of an infectious process. Recommend correlation with urinalysis. Posterior to the right kidney there is irregular soft tissue density material which may represent a small amount of infectious/inflammatory stranding of the perinephric fat. Irregular nodular opacity within the right lower lobe measuring up to 2.7 cm may be secondary to an infectious or inflammatory process however is concerning for  metastatic disease in the setting of known cervical malignancy or possible primary pulmonary malignancy. Multiple additional 2-3 mm nodules within the lungs bilaterally may be infectious or inflammatory in etiology or potentially secondary to metastatic disease. 10 mm sclerotic focus within the lateral left fifth rib may represent a bone island or osseous metastasis. Oval mass within the upper-outer quadrant of the right breast. Recommend dedicated evaluation with mammography if not previously Evaluated.       08/31/2015 - 09/04/2015 Radiation Therapy    Pelvis 9 Gy in 3 fxs for initial vaginal bleeding issues      09/06/2015 PET scan    Hypermetabolic cervical mass with hypermetabolic abdominal and peritoneal retroperitoneal adenopathy, consistent with the given history of cervical carcinoma. 2. Hypermetabolic left supraclavicular and left axillary lymph nodes. Left axillary lymph nodes are new from 08/21/2015, suggesting an infectious or inflammatory etiology. Attention on followup exams is warranted. 3. Mildly hypermetabolic lateral right breast nodule. Patient underwent mammography and ultrasound on 09/01/2015. 4. Coronary artery calcification. 5. Cholelithiasis.      09/08/2015 Pathology Results    Breast, right, needle core biopsy, upper outer quadrant at 10:30 o'clock - FIBROADENOMA WITH CALCIFICATIONS. - THERE IS NO EVIDENCE OF MALIGNANCY. - SEE COMMENT. Microscopic Comment There results were called to the [facility] on 09/11/2015.      09/22/2015 - 12/04/2015 Chemotherapy    She received chemotherapy initially with paclitaxel, developed allergic reaction. Regimen was changed to CDDP gemzar, cycle 1 given 10-06-15 and cycle 2 on 10-19-15. Case was reviewed at multidisciplinary conference, with repeat CT chest favoring reactive nodes over metastatic disease. Decision was made to give definitive radiation  with sensitizing CDDP. She had 5 weekly cycles of CDDP from 11-02-15 thru  12-04-15 with IMRT      10/24/2015 Imaging    Minimally decreased prominence of sub-cm left axillary lymph nodes, and stable sub-cm left supraclavicular lymph nodes. This suggests a resolving reactive or inflammatory etiology, with metastatic disease considered less likely. No definite evidence of metastatic disease or other acute findings within the thorax.      11/09/2015 - 12/21/2015 Radiation Therapy    Pelvis/paraaortic, 32.4 Gy in 18 fractions Pelvis/paraaortic boost, 5.4 Gy in 3 fractions Paraaortic boost, 12.6 Gy in 7 fractions       12/26/2015 - 01/25/2016 Radiation Therapy    12/26/2015, 01/04/2016, 01/11/2016, 01/17/2016, 01/25/2016: The cervical area was boosted with 5 brachytherapy treatments using iridium 192 as the high-dose-rate source, the cervical region received 5.5 gray each of her 5 brachytherapy treatments.      03/21/2016 Imaging    Interval response to therapy. There has been resolution of previous surgical mass. Abdominal and pelvic adenopathy has also resolved in the interval. 2. Residual trace ascites identified within the right pericolic gutter an dependent portion of pelvis. 3. Aortic atherosclerosis. 4. Gallstones.      06/19/2016 Pathology Results    NEGATIVE FOR INTRAEPITHELIAL LESIONS OR MALIGNANCY. BENIGN REACTIVE/REPARATIVE CHANGES.      08/14/2016 Imaging    Interval development of enlarged mediastinal lymph nodes worrisome for metastatic adenopathy. 2. New right lower lobe pulmonary nodule. 3. No evidence for recurrent adenopathy within the abdomen or pelvis. 4. Gallstones 5. Aortic atherosclerosis and multi vessel coronary artery calcification.      08/14/2016 Miscellaneous    Ophthalmlogy exam 07-2016 question of right choroidal met. CT head was negative      09/03/2016 PET scan    Enlarged Hypermetabolic mediastinal hilar lymph nodes consistent with metastatic adenopathy. 2. Hypermetabolic LEFT axillary lymph node is indeterminate. Normal morphology. 3. Low  metabolic activity of the larger RIGHT lower lobe pulmonary nodule favors benign etiology. Smaller RIGHT lobe nodules too small to characterize 4. Small RIGHT breast mass with biopsy clip and low metabolic activity not changed from prior. 5. No evidence of local recurrence or metastasis within the abdomen or pelvis.      01/27/2017 Imaging    No evidence of pulmonary embolus. Aortic atherosclerosis. Extensive mediastinal adenopathy as well as bilateral pulmonary nodules are noted consistent with metastatic disease. Left axillary and bilateral supraclavicular adenopathy is noted as well.       02/14/2017 PET scan    Interval development of new and interval progression of the existing mediastinal and hilar lymphadenopathy. 2. Interval development of hypermetabolic supraclavicular nodal metastases bilaterally and progression of hypermetabolic left axillary lymphadenopathy. 3. Interval development of numerous bilateral pulmonary nodules of varying sizes, many of which are markedly hypermetabolic. Imaging features are consistent with progression of pulmonary metastases. An area of confluent airspace consolidation in the anterior left lower lobe is hypermetabolic and may represent tumor or combination of tumor and infection/inflammation. 4. New small left pleural effusion. 5. Interval development of hypermetabolic right adrenal metastasis. 6. Interval development of small hypermetabolic retroperitoneal lymph nodes in the abdomen, suspicious for metastatic disease.      02/14/2017 Imaging    Two 8 mm enhancing lesions compatible with metastases in the anterior right frontal lobe and left occipital lobe. 2. The mild sinus disease.      02/17/2017 Procedure    Ultrasound-guided core biopsies of the left supraclavicular lymph node      02/17/2017  Pathology Results    Lymph node, needle/core biopsy, left supraclavicular - METASTATIC SQUAMOUS CELL CARCINOMA. Microscopic Comment By immunohistochemistry the  malignant cells are positive for p63, cytokeratin 5/6, and p16. They are negative for TTF-1. Combined with the patient's history, the findings are consistent with metastatic squamous cell carcinoma.       02/28/2017 - 02/28/2017 Radiation Therapy    Left occipital 8 mm target was treated using 2 Dynamic Conformal Arcs to a prescription dose of 20 Gy.  ExacTrac registration was performed for each couch angle.  The 80% isodose line was prescribed.  6 MV X-rays were delivered in the flattening filter free beam mode.  Right Frontal 8 mm target was treated using 2 Dynamic Conformal Arcs to a prescription dose of 20 Gy.  ExacTrac registration was performed for each couch angle.  The 82% isodose line was prescribed.  6 MV X-rays were delivered in the flattening filter free beam mode.       INTERVAL HISTORY: Please see below for problem oriented charting. She is seen today prior to cycle 1 of treatment. Her headaches/nausea had resolved since recent radiation treatment. She is on the dexamethasone taper course. She denies recent infection.  Appetite is stable but she has lost some weight since her last time I saw her. She denies pain  She is attempting to quit smoking  REVIEW OF SYSTEMS:   Constitutional: Denies fevers, chills  Eyes: Denies blurriness of vision Ears, nose, mouth, throat, and face: Denies mucositis or sore throat Respiratory: Denies cough, dyspnea or wheezes Cardiovascular: Denies palpitation, chest discomfort or lower extremity swelling Gastrointestinal:  Denies nausea, heartburn or change in bowel habits Skin: Denies abnormal skin rashes Lymphatics: Denies new lymphadenopathy or easy bruising Neurological:Denies numbness, tingling or new weaknesses Behavioral/Psych: Mood is stable, no new changes  All other systems were reviewed with the patient and are negative.  I have reviewed the past medical history, past surgical history, social history and family history with the  patient and they are unchanged from previous note.  ALLERGIES:  is allergic to paclitaxel.  MEDICATIONS:  Current Outpatient Prescriptions  Medication Sig Dispense Refill  . acetaminophen (TYLENOL) 500 MG tablet Take 1,000 mg by mouth every 8 (eight) hours as needed for mild pain. Reported on 02/28/2016    . dexamethasone (DECADRON) 4 MG tablet Take 1 tablet (4 mg total) by mouth 2 (two) times daily with a meal. 60 tablet 0  . HYDROmorphone (DILAUDID) 2 MG tablet Take 1 tablet (2 mg total) by mouth every 4 (four) hours as needed for severe pain. 30 tablet 0  . Multiple Vitamins-Minerals (CENTRUM ULTRA WOMENS) TABS Take 1 tablet by mouth every morning.    . polyethylene glycol (MIRALAX / GLYCOLAX) packet Take 17 g by mouth daily.    Marland Kitchen LORazepam (ATIVAN) 0.5 MG tablet Take 1 tablet (0.5 mg total) by mouth 2 (two) times daily as needed for anxiety. (Patient not taking: Reported on 02/19/2017) 20 tablet 0  . ondansetron (ZOFRAN) 8 MG tablet Take 1 tablet (8 mg total) by mouth every 8 (eight) hours as needed for nausea. (Patient not taking: Reported on 02/21/2017) 30 tablet 3  . prochlorperazine (COMPAZINE) 10 MG tablet Take 1 tablet (10 mg total) by mouth every 6 (six) hours as needed. (Patient not taking: Reported on 02/21/2017) 30 tablet 3   No current facility-administered medications for this visit.     PHYSICAL EXAMINATION: ECOG PERFORMANCE STATUS: 1 - Symptomatic but completely ambulatory  Vitals:  03/06/17 1025  BP: 138/80  Pulse: (!) 105  Resp: 19  Temp: 98.3 F (36.8 C)   Filed Weights   03/06/17 1025  Weight: 129 lb 14.4 oz (58.9 kg)    GENERAL:alert, no distress and comfortable SKIN: skin color, texture, turgor are normal, no rashes or significant lesions EYES: normal, Conjunctiva are pink and non-injected, sclera clear OROPHARYNX:no exudate, no erythema and lips, buccal mucosa, and tongue normal  NECK: supple, thyroid normal size, non-tender, without nodularity LYMPH:  no  palpable lymphadenopathy in the cervical, axillary or inguinal LUNGS: clear to auscultation and percussion with normal breathing effort HEART: regular rate & rhythm and no murmurs and no lower extremity edema ABDOMEN:abdomen soft, non-tender and normal bowel sounds Musculoskeletal:no cyanosis of digits and no clubbing  NEURO: alert & oriented x 3 with fluent speech, no focal motor/sensory deficits  LABORATORY DATA:  I have reviewed the data as listed    Component Value Date/Time   NA 134 (L) 03/06/2017 0958   K 3.9 03/06/2017 0958   CL 102 02/17/2017 1130   CO2 23 03/06/2017 0958   GLUCOSE 104 03/06/2017 0958   BUN 21.0 03/06/2017 0958   CREATININE 0.7 03/06/2017 0958   CALCIUM 9.2 03/06/2017 0958   PROT 6.8 03/06/2017 0958   ALBUMIN 2.7 (L) 03/06/2017 0958   AST 25 03/06/2017 0958   ALT 57 (H) 03/06/2017 0958   ALKPHOS 121 03/06/2017 0958   BILITOT 0.22 03/06/2017 0958   GFRNONAA >60 02/17/2017 1130   GFRAA >60 02/17/2017 1130    No results found for: SPEP, UPEP  Lab Results  Component Value Date   WBC 23.8 (H) 03/06/2017   NEUTROABS 20.2 (H) 03/06/2017   HGB 10.7 (L) 03/06/2017   HCT 33.2 (L) 03/06/2017   MCV 81.5 03/06/2017   PLT 335 03/06/2017      Chemistry      Component Value Date/Time   NA 134 (L) 03/06/2017 0958   K 3.9 03/06/2017 0958   CL 102 02/17/2017 1130   CO2 23 03/06/2017 0958   BUN 21.0 03/06/2017 0958   CREATININE 0.7 03/06/2017 0958      Component Value Date/Time   CALCIUM 9.2 03/06/2017 0958   ALKPHOS 121 03/06/2017 0958   AST 25 03/06/2017 0958   ALT 57 (H) 03/06/2017 0958   BILITOT 0.22 03/06/2017 0958       RADIOGRAPHIC STUDIES: I have personally reviewed the radiological images as listed and agreed with the findings in the report. Mr Jeri Cos Wo Contrast  Result Date: 02/21/2017 CLINICAL DATA:  Cervical cancer. Brain metastases. Treatment planning scan for SRS. EXAM: MRI HEAD WITHOUT AND WITH CONTRAST TECHNIQUE: Multiplanar,  multiecho pulse sequences of the brain and surrounding structures were obtained without and with intravenous contrast. CONTRAST:  16m MULTIHANCE GADOBENATE DIMEGLUMINE 529 MG/ML IV SOLN COMPARISON:  02/14/2017 FINDINGS: Brain: 2 8 mm brain metastases are again noted in the anterior right frontal and left occipital cortex. No newly seen metastasis. Minimal associated vasogenic edema at the metastases. Few chronic microvascular insults in the cerebral white matter. Normal brain volume. No acute infarct, hemorrhage, hydrocephalus, or shift. Vascular: Normal flow voids. Skull and upper cervical spine: No marrow lesion noted. Sinuses/Orbits: No acute finding IMPRESSION: Two subcentimeter cerebral metastases are stable from 02/14/2017, located in the right frontal and left occipital cortex. No newly seen metastasis. Electronically Signed   By: JMonte FantasiaM.D.   On: 02/21/2017 17:17   Mr BJeri CosWFEContrast  Result Date: 02/14/2017  CLINICAL DATA:  Lung cancer.  Dizziness. EXAM: MRI HEAD WITHOUT AND WITH CONTRAST TECHNIQUE: Multiplanar, multiecho pulse sequences of the brain and surrounding structures were obtained without and with intravenous contrast. CONTRAST:  38m MULTIHANCE GADOBENATE DIMEGLUMINE 529 MG/ML IV SOLN COMPARISON:  CT head without and with contrast 08/14/2016 FINDINGS: Brain: The enhancing lesion within the anterior right middle frontal gyrus measures 7.5 x 6.5 x 8.0 mm on image 19 of series 12. A left occipital metastasis measures 8.5 x 7.5 x 8.0 mm on image 25 of series 11. No other focal parenchymal enhancement is present. There is mild T2 changes associated with these lesions. No acute infarcts or hemorrhage is present. Ventricles are of normal size. Minimal periventricular white matter changes are otherwise within normal limits for age. No significant extra-axial fluid collection is present. Vascular: Flow is present in the major intracranial arteries. Skull and upper cervical spine: The  skullbase is normal. The craniocervical junction is normal. The upper cervical spine is unremarkable. Midline sagittal structures are otherwise within normal limits. Sinuses/Orbits: Mucosal thickening and polyps are present in the maxillary sinuses, right greater than left. The remaining paranasal sinuses and right mastoid air cells are clear. IMPRESSION: 1. Two 8 mm enhancing lesions compatible with metastases in the anterior right frontal lobe and left occipital lobe. 2. The mild sinus disease. Electronically Signed   By: CSan MorelleM.D.   On: 02/14/2017 17:19   Nm Pet Image Restag (ps) Skull Base To Thigh  Result Date: 02/14/2017 CLINICAL DATA:  Subsequent treatment strategy for cervical cancer. EXAM: NUCLEAR MEDICINE PET SKULL BASE TO THIGH TECHNIQUE: 7.7 MCi F-18 FDG was injected intravenously. Full-ring PET imaging was performed from the skull base to thigh after the radiotracer. CT data was obtained and used for attenuation correction and anatomic localization. FASTING BLOOD GLUCOSE:  Value: 108 mg/dl COMPARISON:  09/03/2016 FINDINGS: NECK Interval development of hypermetabolic lymph nodes in the supraclavicular region bilaterally. New 11 mm left supraclavicular lymph node (image 39 series 4) demonstrates SUV max = 5.9. CHEST Interval progression of hypermetabolic left axillary lymphadenopathy. Mediastinal and hilar lymphadenopathy has progressed in the interval with new lymph nodes evident in previous lymph nodes enlarging in the interval. 13 mm short axis high right paratracheal lymph node (image 46 series 4) was 5 mm short axis previously and today demonstrates SUV max = 10.5. A new hypermetabolic right infrahilar lymph node demonstrates SUV max = 9.3. Interval development of multiple bilateral pulmonary nodules, too numerous to count. 1.5 cm anterior left upper lobe pulmonary nodule (image 20 series 8) is new since prior study and demonstrates SUV max = 11.4. 3.2 cm hypermetabolic nodule in  the right costophrenic sulcus is new in the interval. There is confluent hypermetabolic airspace disease in the anterior left lower lobe. Small left pleural effusion is new in the interval. Low level uptake again identified in the right breast lesion containing localization clip. ABDOMEN/PELVIS No abnormal hypermetabolic activity within the liver, pancreas, or spleen. A new 1.8 cm right adrenal nodule is hypermetabolic with SUV max = 9.9. A new 7 mm short axis left para-aortic lymph node (image 123 series 4) is hypermetabolic with SUV max = 5.3. Hypermetabolic small aortocaval lymph node is identified at the same level. Several hypermetabolic foci are seen scattered along the small bowel/mesentery, but choroid live lesions cannot be identified on the noncontrast CT images. Cholelithiasis again noted. Abdominal aortic atherosclerosis evident without aneurysm. Small volume intraperitoneal free fluid. SKELETON No focal hypermetabolic activity to suggest skeletal  metastasis. IMPRESSION: 1. Interval development of new and interval progression of the existing mediastinal and hilar lymphadenopathy. 2. Interval development of hypermetabolic supraclavicular nodal metastases bilaterally and progression of hypermetabolic left axillary lymphadenopathy. 3. Interval development of numerous bilateral pulmonary nodules of varying sizes, many of which are markedly hypermetabolic. Imaging features are consistent with progression of pulmonary metastases. An area of confluent airspace consolidation in the anterior left lower lobe is hypermetabolic and may represent tumor or combination of tumor and infection/inflammation. 4. New small left pleural effusion. 5. Interval development of hypermetabolic right adrenal metastasis. 6. Interval development of small hypermetabolic retroperitoneal lymph nodes in the abdomen, suspicious for metastatic disease. Electronically Signed   By: Misty Stanley M.D.   On: 02/14/2017 16:29   US  Biopsy  Result Date: 02/17/2017 INDICATION: History of cervical cancer. Patient has multiple pulmonary nodules and lymphadenopathy. Plan for ultrasound-guided biopsy of a left supraclavicular lymph node for tissue diagnosis. EXAM: ULTRASOUND-GUIDED CORE BIOPSY OF LEFT SUPRACLAVICULAR LYMPH NODE MEDICATIONS: None. ANESTHESIA/SEDATION: Moderate (conscious) sedation was employed during this procedure. A total of Versed 2.0 mg and Fentanyl 50 mcg was administered intravenously. Moderate Sedation Time: 15 minutes. The patient's level of consciousness and vital signs were monitored continuously by radiology nursing throughout the procedure under my direct supervision. FLUOROSCOPY TIME:  None COMPLICATIONS: None immediate. PROCEDURE: Informed written consent was obtained from the patient after a thorough discussion of the procedural risks, benefits and alternatives. All questions were addressed. A timeout was performed prior to the initiation of the procedure. Left side of the neck was evaluated with ultrasound. Enlarged left supraclavicular lymph node was targeted for biopsy. The left side of the neck was prepped with chlorhexidine and sterile field was created. Skin was anesthetized with 1% lidocaine. Using ultrasound guidance, core biopsies were obtained of a left supraclavicular lymph node with an 18 gauge core device. Four samples placed in formalin and two samples placed in saline. Bandage placed over the puncture site. FINDINGS: Enlarged hypoechoic lymph nodes in left supraclavicular area. These lymph nodes are rounded and appear abnormal. Core biopsy needle identified within the supraclavicular lymph node on all occasions. IMPRESSION: Ultrasound-guided core biopsies of the left supraclavicular lymph node. Electronically Signed   By: Markus Daft M.D.   On: 02/17/2017 15:04    ASSESSMENT & PLAN:  Malignant neoplasm of cervix [facility]) The patient is currently asymptomatic. Her recent nausea and headache had  disappeared since palliative radiation treatment. She is currently on the dexamethasone taper course. As previously discussed, we will proceed with carboplatin every 3 weeks. Plan to repeat imaging study after 3 cycles of treatment. I shared with her the news regarding Pembolizumab receiving expedited review from FDA for possible future approval for recurrent metastatic cervical cancer.  Hopefully, it can be approved by end of June  Brain metastasis [facility]) She has completed treatment. She is not symptomatic. She is on steroid taper course. Plan to repeat imaging study in 3 months  Anemia in neoplastic disease She has recent anemia and adequate workup. The pattern is not suggestive of iron deficiency anemia, likely anemia related to her malignancy. She is not very symptomatic. I recommend observation only for now  Protein-calorie malnutrition, moderate ([place]) She has lost some weight. She will see dietitian soon. I encouraged her to increase oral intake as tolerated  Continuous tobacco abuse I spent some time counseling the patient the importance of tobacco cessation. We discussed common strategies including nicotine patches, Tobacco Quit-line, and other nicotine replacement products to assist  in hereffort to quit  she appears motivated to quit.    No orders of the defined types were placed in this encounter.  All questions were answered. The patient knows to call the clinic with any problems, questions or concerns. No barriers to learning was detected. I spent 20 minutes counseling the patient face to face. The total time spent in the appointment was 25 minutes and more than 50% was on counseling and review of test results     Heath Lark, MD 03/06/2017 11:06 AM

## 2017-03-06 NOTE — Assessment & Plan Note (Signed)
She has recent anemia and adequate workup. The pattern is not suggestive of iron deficiency anemia, likely anemia related to her malignancy. She is not very symptomatic. I recommend observation only for now

## 2017-03-06 NOTE — Telephone Encounter (Signed)
Appointments scheduled per 3/15 LOS. Patient given AVS report and calendars with future scheduled appointments.

## 2017-03-06 NOTE — Assessment & Plan Note (Signed)
The patient is currently asymptomatic. Her recent nausea and headache had disappeared since palliative radiation treatment. She is currently on the dexamethasone taper course. As previously discussed, we will proceed with carboplatin every 3 weeks. Plan to repeat imaging study after 3 cycles of treatment. I shared with her the news regarding Pembolizumab receiving expedited review from FDA for possible future approval for recurrent metastatic cervical cancer.  Hopefully, it can be approved by end of June

## 2017-03-06 NOTE — Assessment & Plan Note (Signed)
She has lost some weight. She will see dietitian soon. I encouraged her to increase oral intake as tolerated

## 2017-03-06 NOTE — Progress Notes (Signed)
Nutrition follow-up completed with patient during chemotherapy for recurrent metastatic cervical cancer. Patient's weight has declined and was documented as 129.9 pounds on March 15, down from usual body weight of 152 pounds. Labs reviewed and noted albumin 2.9 pounds February 26. Patient reports occasional nausea but denies diarrhea. She does not have a taste for meat at this time but does consume dairy foods for increased protein.  Nutrition diagnosis: Unintended weight loss continues.  Intervention: Educated patient on strategies for increasing calories and protein. I provided provided recipes and fact sheets on high-protein foods. Encouraged patient to work to increase calories so that she can maintain weight and lean body mass. Recommended patient drink 2 [employer] Instant Breakfast daily with fortified milk.  Provided coupons. Questions were answered.  Teach back method used.  Contact information provided.  Monitoring, evaluation, goals:  Patient will tolerate increased calories and protein to minimize weight loss and improve nutrition impact symptoms.  Next visit: Thursday, April 5, during infusion.  **Disclaimer: This note was dictated with voice recognition software. Similar sounding words can inadvertently be transcribed and this note may contain transcription errors which may not have been corrected upon publication of note.**

## 2017-03-06 NOTE — Assessment & Plan Note (Signed)
She has completed treatment. She is not symptomatic. She is on steroid taper course. Plan to repeat imaging study in 3 months

## 2017-03-20 ENCOUNTER — Ambulatory Visit: Payer: Self-pay | Admitting: Radiation Oncology

## 2017-03-23 DEATH — deceased

## 2017-03-26 ENCOUNTER — Encounter: Payer: Self-pay | Admitting: Radiation Therapy

## 2017-03-26 NOTE — Progress Notes (Signed)
   Pt expired 03/17/2017  Called to share appointment information, family member shared the news of her passing.   Mont Dutton R.T.(R)(T) Special Procedures Navigator

## 2017-03-27 ENCOUNTER — Ambulatory Visit: Payer: BLUE CROSS/BLUE SHIELD

## 2017-03-27 ENCOUNTER — Ambulatory Visit: Payer: BLUE CROSS/BLUE SHIELD | Admitting: Hematology and Oncology

## 2017-03-27 ENCOUNTER — Encounter: Payer: BLUE CROSS/BLUE SHIELD | Admitting: Nutrition

## 2017-03-27 ENCOUNTER — Other Ambulatory Visit: Payer: BLUE CROSS/BLUE SHIELD

## 2017-03-31 ENCOUNTER — Ambulatory Visit: Payer: Self-pay | Admitting: Radiation Oncology

## 2017-04-04 ENCOUNTER — Other Ambulatory Visit: Payer: Self-pay | Admitting: Nurse Practitioner

## 2017-04-17 ENCOUNTER — Ambulatory Visit: Payer: BLUE CROSS/BLUE SHIELD | Admitting: Hematology and Oncology

## 2017-04-17 ENCOUNTER — Other Ambulatory Visit: Payer: BLUE CROSS/BLUE SHIELD

## 2017-04-17 ENCOUNTER — Ambulatory Visit: Payer: BLUE CROSS/BLUE SHIELD

## 2017-06-18 ENCOUNTER — Ambulatory Visit: Payer: BLUE CROSS/BLUE SHIELD | Admitting: Gynecologic Oncology
